# Patient Record
Sex: Female | Born: 1950 | Race: Black or African American | Hispanic: No | Marital: Single | Smoking: Never smoker
Health system: Southern US, Community
[De-identification: ages and names within clinical notes are randomized; demographics above are authoritative.]

## PROBLEM LIST (undated history)

## (undated) DIAGNOSIS — Z8041 Family history of malignant neoplasm of ovary: Secondary | ICD-10-CM

## (undated) DIAGNOSIS — M1990 Unspecified osteoarthritis, unspecified site: Secondary | ICD-10-CM

## (undated) DIAGNOSIS — N6091 Unspecified benign mammary dysplasia of right breast: Secondary | ICD-10-CM

## (undated) DIAGNOSIS — Z8042 Family history of malignant neoplasm of prostate: Secondary | ICD-10-CM

## (undated) DIAGNOSIS — I10 Essential (primary) hypertension: Secondary | ICD-10-CM

## (undated) DIAGNOSIS — Z800 Family history of malignant neoplasm of digestive organs: Secondary | ICD-10-CM

## (undated) DIAGNOSIS — Z923 Personal history of irradiation: Secondary | ICD-10-CM

## (undated) DIAGNOSIS — K219 Gastro-esophageal reflux disease without esophagitis: Secondary | ICD-10-CM

## (undated) DIAGNOSIS — E785 Hyperlipidemia, unspecified: Secondary | ICD-10-CM

## (undated) HISTORY — DX: Family history of malignant neoplasm of prostate: Z80.42

## (undated) HISTORY — PX: BREAST LUMPECTOMY: SHX2

## (undated) HISTORY — DX: Family history of malignant neoplasm of ovary: Z80.41

## (undated) HISTORY — PX: ABDOMINAL HYSTERECTOMY: SHX81

## (undated) HISTORY — DX: Family history of malignant neoplasm of digestive organs: Z80.0

## (undated) HISTORY — PX: JOINT REPLACEMENT: SHX530

---

## 2006-08-16 ENCOUNTER — Emergency Department (HOSPITAL_COMMUNITY): Admission: EM | Admit: 2006-08-16 | Discharge: 2006-08-17 | Payer: Self-pay | Admitting: Emergency Medicine

## 2008-06-22 IMAGING — CT CT HEAD W/O CM
3 of 4 series · 16 of 47 positions shown, 19 images · non-contrast
Comparison: None.
COMPARISON: None.

CLINICAL DATA: Fell, striking the right side of the head and the right side of
the face. Headache.

CRANIAL CT WITHOUT CONTRAST  08/16/2006:
TECHNIQUE: 5mm axial images were obtained from the skull base through the
vertex without intravenous contrast.
TECHNIQUE: Axial plane CT imaging of the maxillofacial structures was performed
including the facial bones, paranasal sinuses, and orbits.  No intravenous
contrast was administered. Computer-generated coronal and sagittal reformatted
images obtained. A metallic marker was placed on the right temple in order to
reliably differentiate right from left.

[Series 6: facial 2.0 h30s st · axial · 0.33mm/px · z∈[-210,-82]mm · 10 of 72 slices shown, 13 images]
[im 4/72  brain]
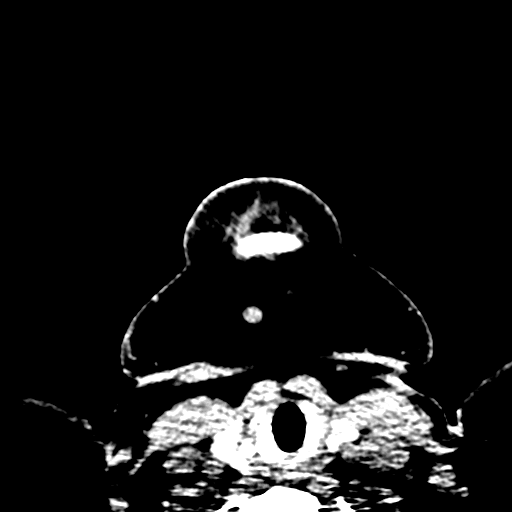
[im 4/72  bone]
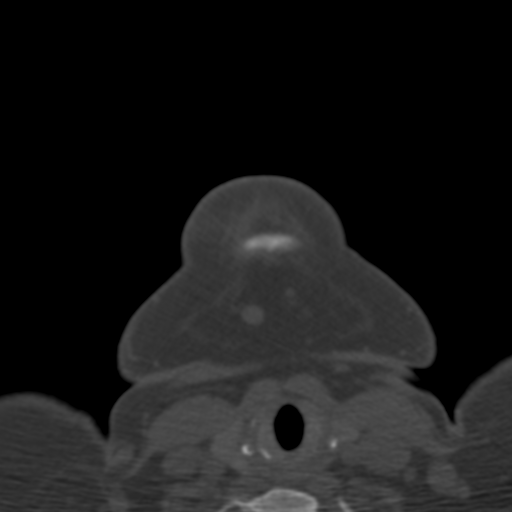
[im 11/72  brain]
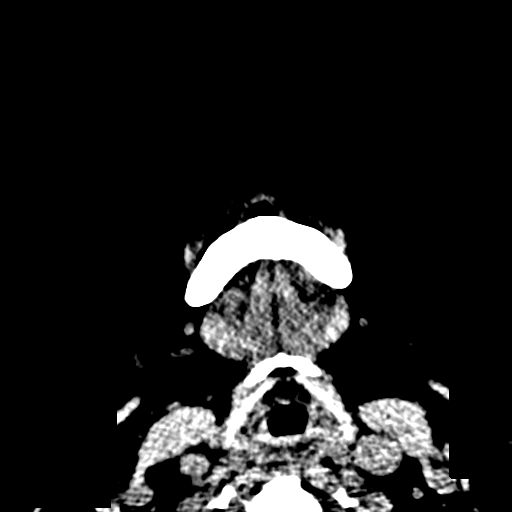
[im 18/72  brain]
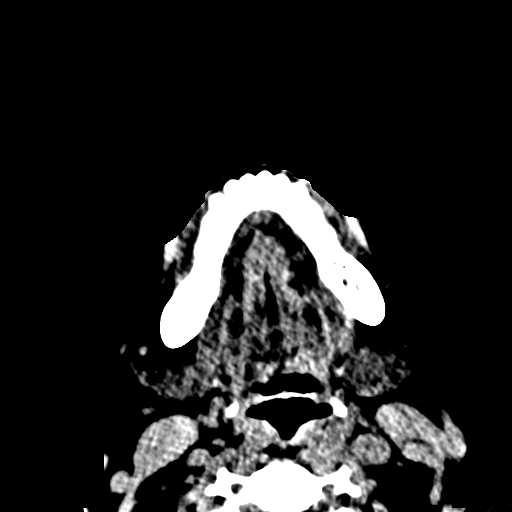
[im 25/72  brain]
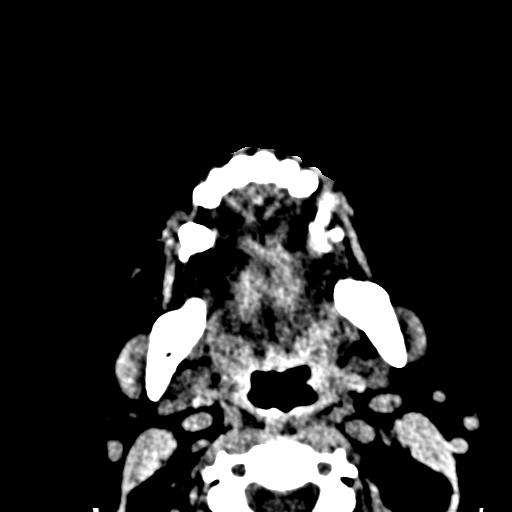
[im 32/72  brain]
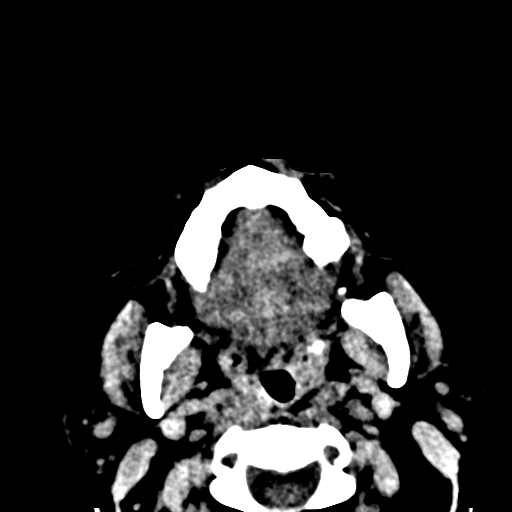
[im 32/72  bone]
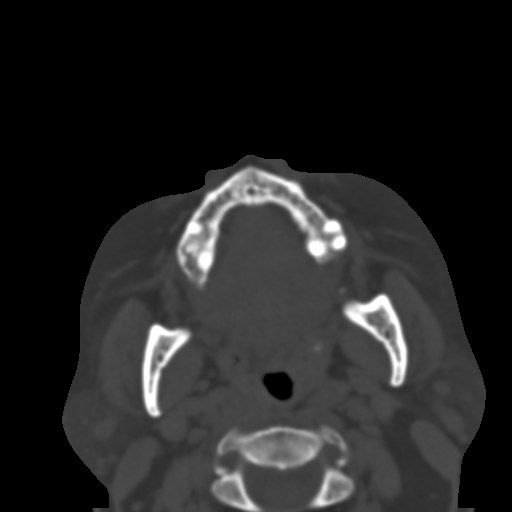
[im 40/72  brain]
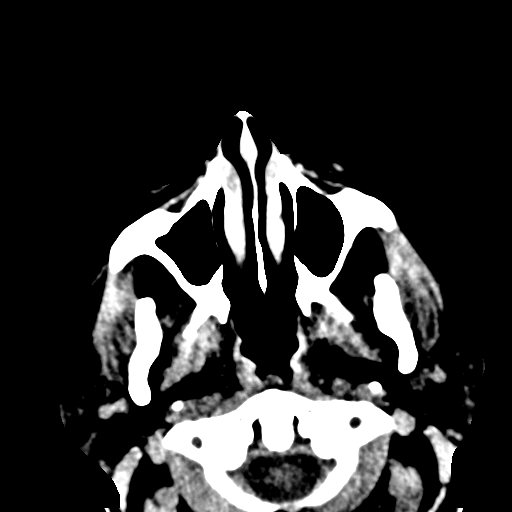
[im 47/72  brain]
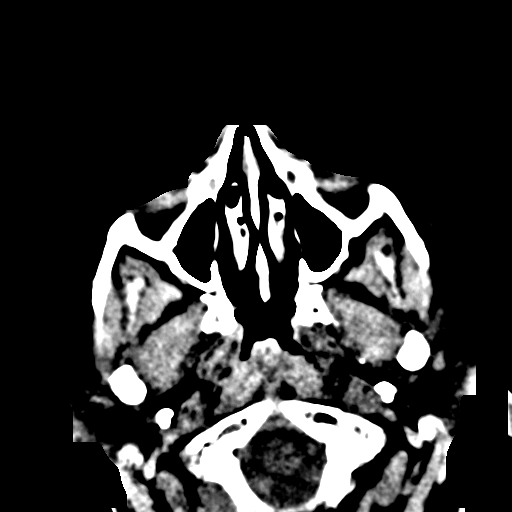
[im 54/72  brain]
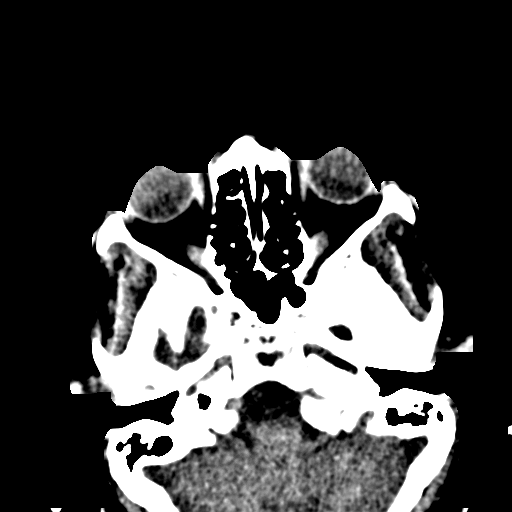
[im 61/72  brain]
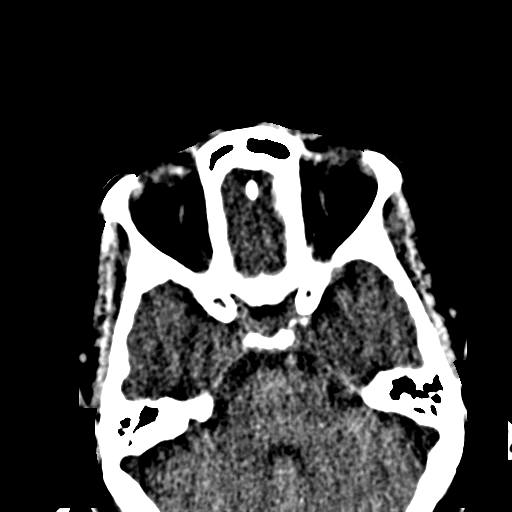
[im 61/72  bone]
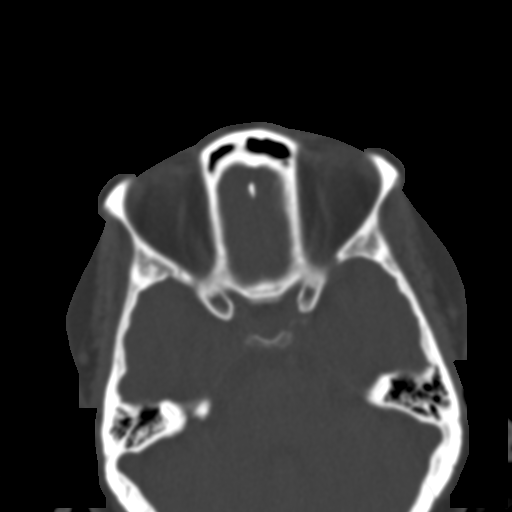
[im 68/72  brain]
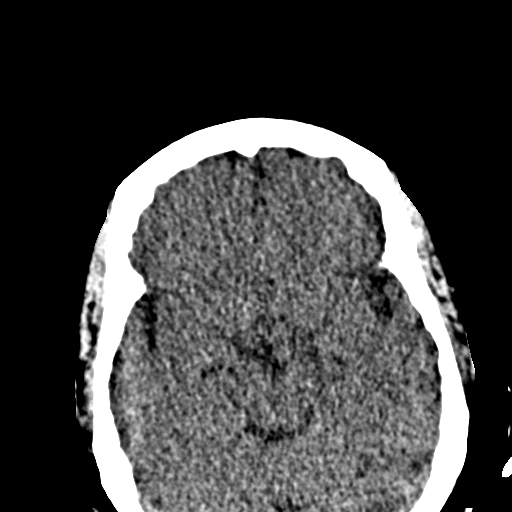

[Series 7: facial sagittal · sagittal · 0.39mm/px · 3 of 75 slices shown]
[im 25/75  brain]
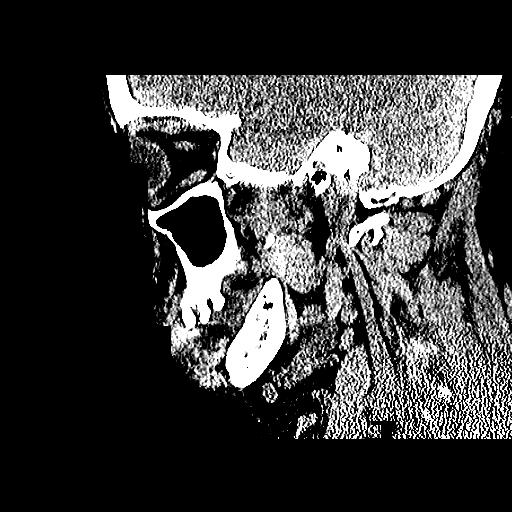
[im 38/75  brain]
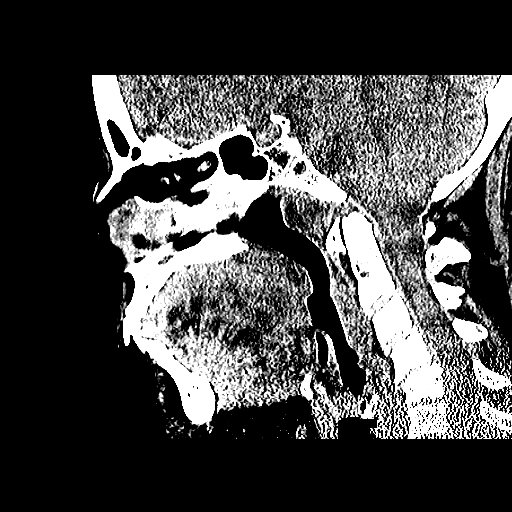
[im 50/75  brain]
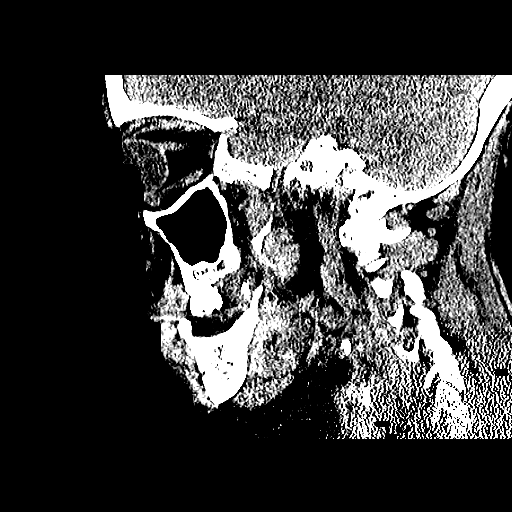

[Series 8: facial coronal · coronal · 0.39mm/px · 3 of 69 slices shown]
[im 23/69  brain]
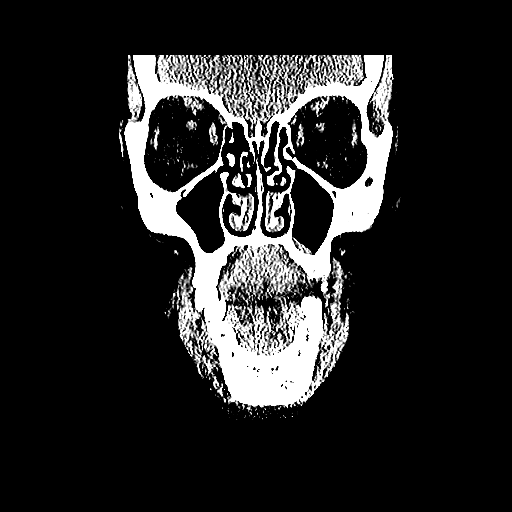
[im 31/69  brain]
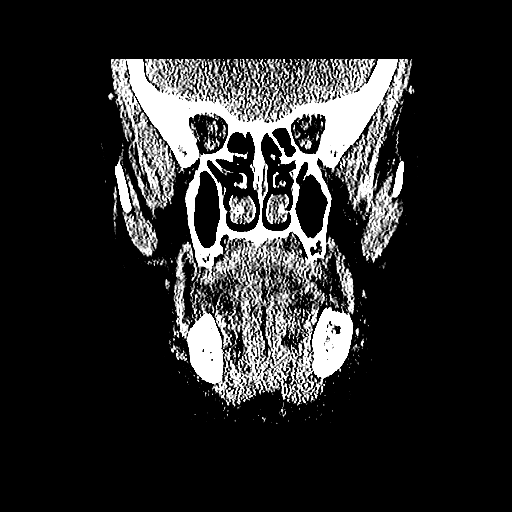
[im 38/69  brain]
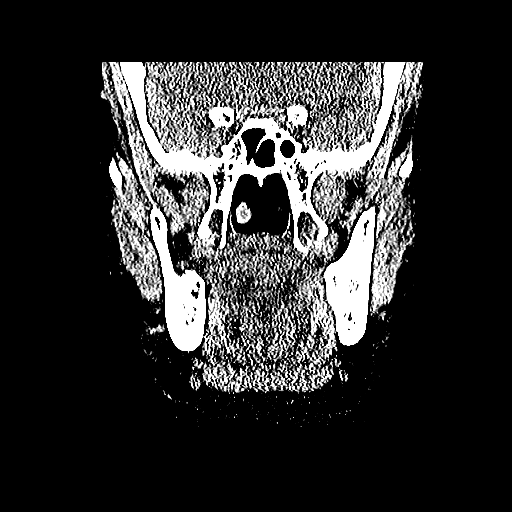

[16 of 47 positions shown; findings below may reference images not displayed]

FINDINGS: Ventricular system normal in size and appearance for age. No mass
lesion or midline shift. No acute hemorrhage or hematoma. No extra-axial fluid
collections. Linear calcifications in the basal ganglia. No acute
stroke/infarction or other focal brain parenchymal abnormalities.

No skull fractures or other focal osseous abnormalities involving the skull.
Mastoid air cells well-aerated.
IMPRESSION: No significant intracranial abnormalities. 

CT MAXILLOFACIAL WITHOUT CONTRAST 08/16/2006:
FINDINGS: No fractures involving the facial bones. Paranasal sinuses well
aerated with the exception of minimal mucosal thickening at the base of the left
maxillary sinus. Both orbits and both globes intact. Bony nasal septum midline.
No anatomic variants involving the paranasal sinuses. Temporomandibular joints
intact. Caries noted involving multiple teeth.
IMPRESSION: 1. No facial bone fractures.
2. Minimal, insignificant chronic left maxillary sinusitis. 
3. Dental disease.

## 2008-06-22 IMAGING — CR DG LUMBAR SPINE COMPLETE 4+V
5 series · 5 of 5 positions shown · non-contrast
Comparison: None.
COMPARISON: None.

CLINICAL DATA: Fell. Low back pain and right wrist pain.

LUMBAR SPINE - 5 VIEW 08/16/2006:

[t l-spine a.p.]
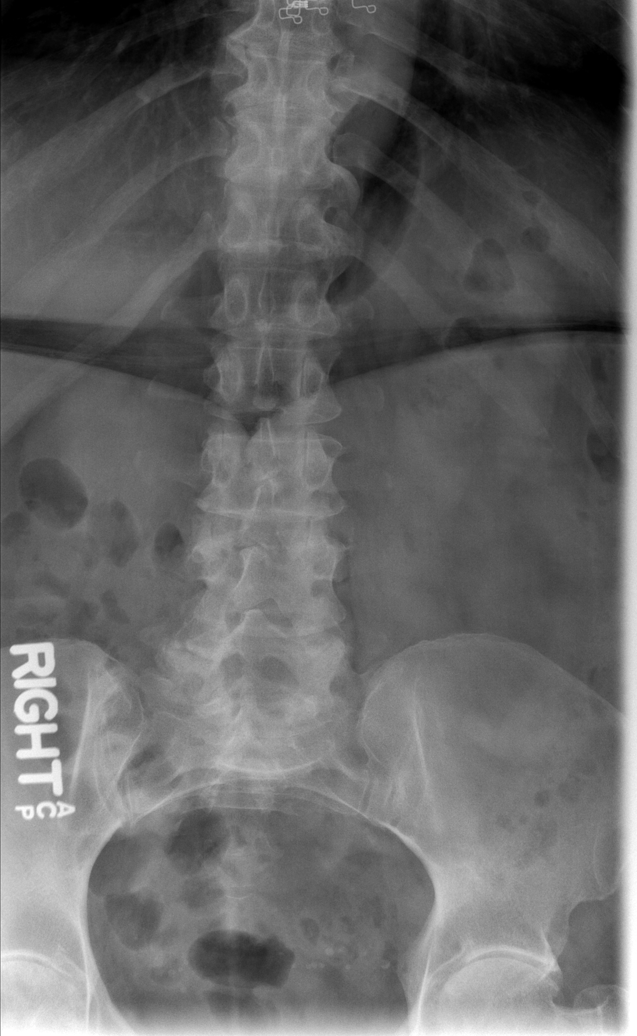

[t l-spine oblique exposure (1 of 2)]
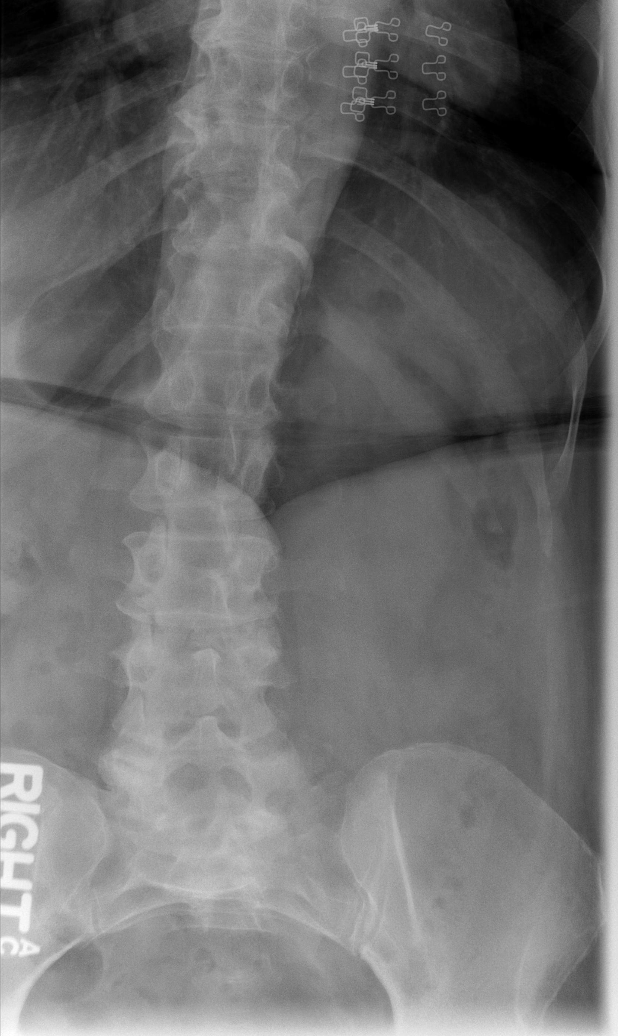

[t l-spine oblique exposure (2 of 2)]
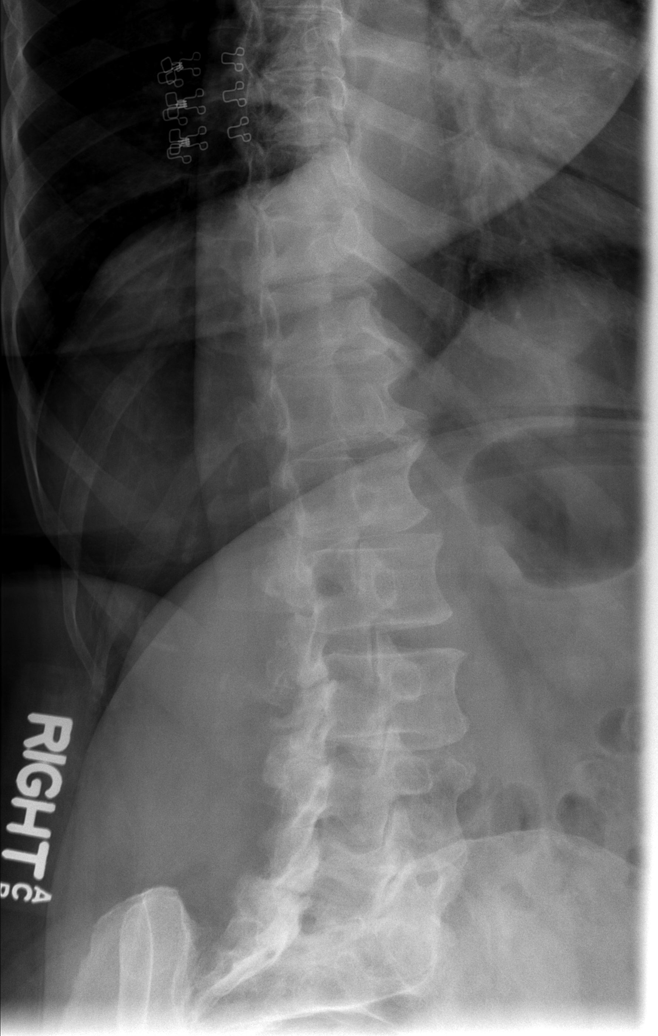

[t l-spine lat *]
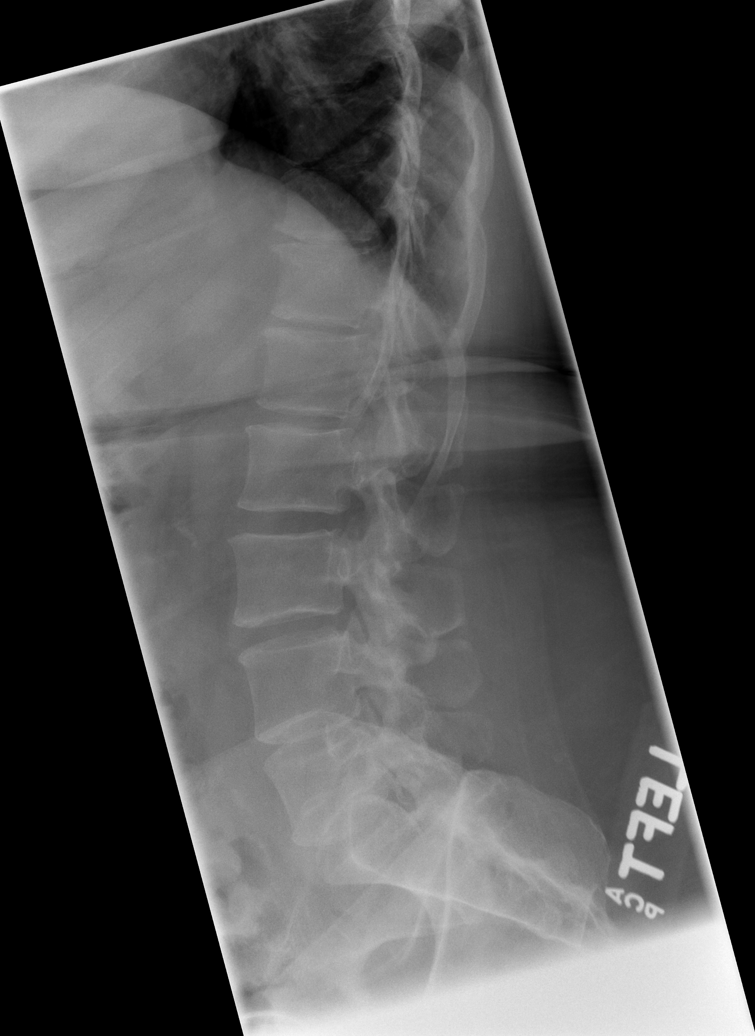

[t l-spine l5-s1 spot *]
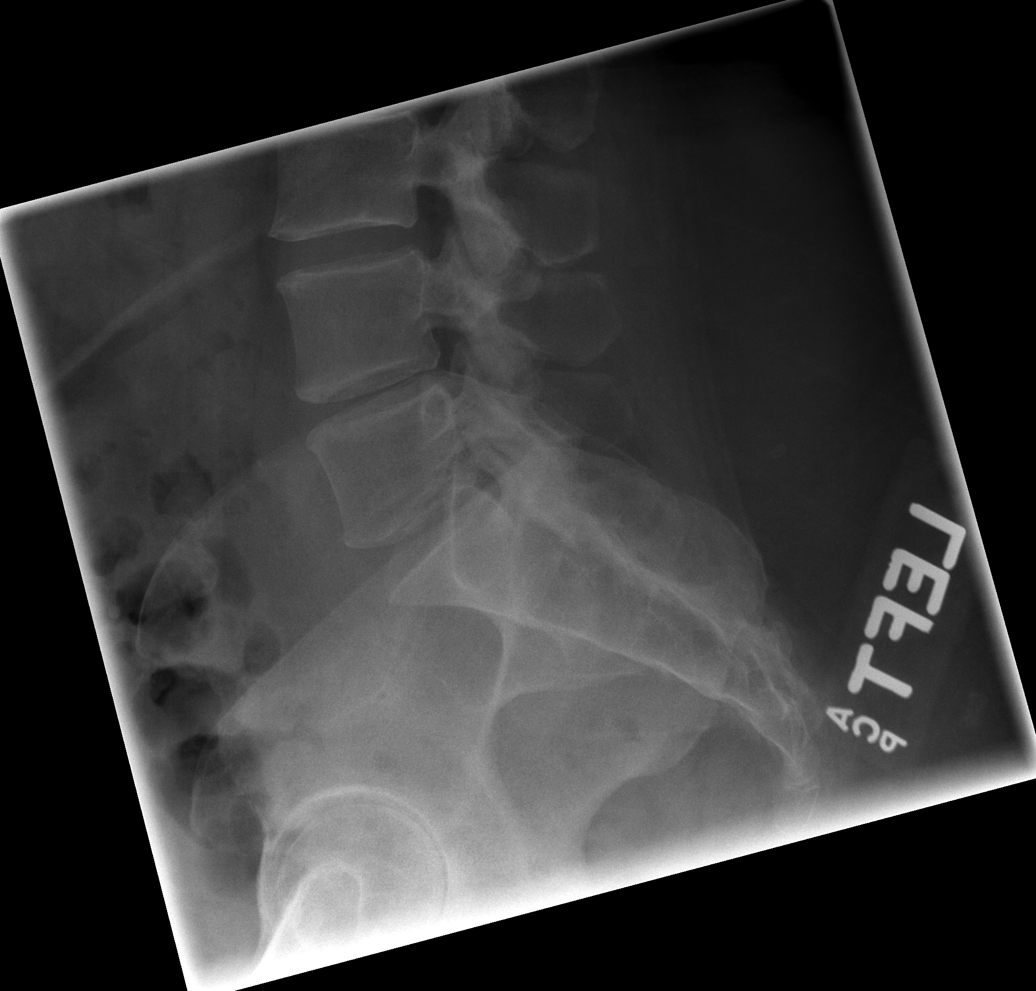

[5 of 5 positions shown; findings below may reference images not displayed]

FINDINGS: Five nonrib-bearing lumbar vertebra with anatomic alignment. Mild
disc space narrowing T12-L1 and L1-L2. Remaining disc spaces well-preserved. No
pars defect. Facet degenerative changes on the right at L5-S1. Visualized
sacroiliac joints intact. Lower thoracic spondylosis noted.
IMPRESSION: No acute skeletal abnormalities. Mild degenerative disc disease T12-L1 and
L1-L2. Right-sided facet degenerative changes at L5-S1. 

RIGHT  WRIST - 4 VIEW  08/16/2006:
FINDINGS: No evidence of acute fracture or dislocation. Joint spaces
well-preserved. No intrinsic osseous abnormalities.
IMPRESSION: No acute skeletal abnormalities.

## 2015-04-14 ENCOUNTER — Other Ambulatory Visit (HOSPITAL_COMMUNITY): Payer: Self-pay | Admitting: Orthopedic Surgery

## 2015-04-14 DIAGNOSIS — M25562 Pain in left knee: Secondary | ICD-10-CM

## 2015-04-14 DIAGNOSIS — M25561 Pain in right knee: Secondary | ICD-10-CM

## 2015-04-19 ENCOUNTER — Encounter (HOSPITAL_COMMUNITY): Payer: Medicare Other

## 2020-12-26 ENCOUNTER — Other Ambulatory Visit: Payer: Self-pay | Admitting: General Surgery

## 2020-12-26 DIAGNOSIS — N6091 Unspecified benign mammary dysplasia of right breast: Secondary | ICD-10-CM

## 2020-12-27 ENCOUNTER — Other Ambulatory Visit: Payer: Self-pay | Admitting: General Surgery

## 2020-12-27 DIAGNOSIS — N6091 Unspecified benign mammary dysplasia of right breast: Secondary | ICD-10-CM

## 2021-01-13 ENCOUNTER — Encounter (HOSPITAL_BASED_OUTPATIENT_CLINIC_OR_DEPARTMENT_OTHER): Payer: Self-pay | Admitting: General Surgery

## 2021-01-17 ENCOUNTER — Other Ambulatory Visit: Payer: Self-pay

## 2021-01-17 ENCOUNTER — Encounter (HOSPITAL_BASED_OUTPATIENT_CLINIC_OR_DEPARTMENT_OTHER): Payer: Self-pay | Admitting: General Surgery

## 2021-01-20 ENCOUNTER — Encounter (HOSPITAL_BASED_OUTPATIENT_CLINIC_OR_DEPARTMENT_OTHER)
Admission: RE | Admit: 2021-01-20 | Discharge: 2021-01-20 | Disposition: A | Discharge status: Home / Self Care | Payer: Medicare PPO | Source: Ambulatory Visit | Attending: General Surgery | Admitting: General Surgery

## 2021-01-20 DIAGNOSIS — I10 Essential (primary) hypertension: Secondary | ICD-10-CM | POA: Diagnosis not present

## 2021-01-20 DIAGNOSIS — Z01818 Encounter for other preprocedural examination: Secondary | ICD-10-CM | POA: Diagnosis not present

## 2021-01-20 LAB — BASIC METABOLIC PANEL
Anion gap: 10 (ref 5–15)
BUN: 20 mg/dL (ref 8–23)
CO2: 24 mmol/L (ref 22–32)
Calcium: 9.7 mg/dL (ref 8.9–10.3)
Chloride: 105 mmol/L (ref 98–111)
Creatinine, Ser: 1.39 mg/dL — ABNORMAL HIGH (ref 0.44–1.00)
GFR, Estimated: 41 mL/min — ABNORMAL LOW (ref >60)
Glucose, Bld: 88 mg/dL (ref 70–99)
Potassium: 4.3 mmol/L (ref 3.5–5.1)
Sodium: 139 mmol/L (ref 135–145)

## 2021-01-20 NOTE — Progress Notes (Signed)

## 2021-01-24 ENCOUNTER — Other Ambulatory Visit: Payer: Self-pay | Admitting: General Surgery

## 2021-01-24 ENCOUNTER — Ambulatory Visit
Admission: RE | Admit: 2021-01-24 | Discharge: 2021-01-24 | Disposition: A | Discharge status: Home / Self Care | Payer: Medicare PPO | Source: Ambulatory Visit | Attending: General Surgery | Admitting: General Surgery

## 2021-01-24 ENCOUNTER — Other Ambulatory Visit: Payer: Self-pay

## 2021-01-24 DIAGNOSIS — N6091 Unspecified benign mammary dysplasia of right breast: Secondary | ICD-10-CM

## 2021-01-24 NOTE — Anesthesia Preprocedure Evaluation (Addendum)
Anesthesia Evaluation  Patient identified by MRN, date of birth, ID band Patient awake    Reviewed: Allergy & Precautions, NPO status , Patient's Chart, lab work & pertinent test results, reviewed documented beta blocker date and time   History of Anesthesia Complications Negative for: history of anesthetic complications  Airway Mallampati: II  TM Distance: >3 FB Neck ROM: Full    Dental  (+) Chipped, Missing, Poor Dentition,    Pulmonary neg pulmonary ROS,    Pulmonary exam normal        Cardiovascular hypertension, Pt. on medications and Pt. on home beta blockers Normal cardiovascular exam     Neuro/Psych negative neurological ROS  negative psych ROS   GI/Hepatic GERD  Controlled,  Endo/Other  Morbid obesity  Renal/GU Cr 1.39  negative genitourinary   Musculoskeletal  (+) Arthritis ,   Abdominal   Peds  Hematology negative hematology ROS (+)   Anesthesia Other Findings Day of surgery medications reviewed with patient.  Reproductive/Obstetrics negative OB ROS                           Anesthesia Physical Anesthesia Plan  ASA: 3  Anesthesia Plan: General   Post-op Pain Management: Tylenol PO (pre-op)   Induction: Intravenous  PONV Risk Score and Plan: 3 and Treatment may vary due to age or medical condition, Ondansetron and Dexamethasone  Airway Management Planned: LMA  Additional Equipment: None  Intra-op Plan:   Post-operative Plan: Extubation in OR  Informed Consent: I have reviewed the patients History and Physical, chart, labs and discussed the procedure including the risks, benefits and alternatives for the proposed anesthesia with the patient or authorized representative who has indicated his/her understanding and acceptance.     Dental advisory given  Plan Discussed with: CRNA  Anesthesia Plan Comments:        Anesthesia Quick Evaluation

## 2021-01-25 ENCOUNTER — Encounter (HOSPITAL_BASED_OUTPATIENT_CLINIC_OR_DEPARTMENT_OTHER)
Admission: RE | Disposition: A | Discharge status: Home / Self Care | Payer: Self-pay | Source: Home / Self Care | Attending: General Surgery

## 2021-01-25 ENCOUNTER — Ambulatory Visit
Admission: RE | Admit: 2021-01-25 | Discharge: 2021-01-25 | Disposition: A | Discharge status: Home / Self Care | Payer: Medicare Other | Source: Ambulatory Visit | Attending: General Surgery | Admitting: General Surgery

## 2021-01-25 ENCOUNTER — Ambulatory Visit (HOSPITAL_BASED_OUTPATIENT_CLINIC_OR_DEPARTMENT_OTHER)
Admission: RE | Admit: 2021-01-25 | Discharge: 2021-01-25 | Disposition: A | Discharge status: Home / Self Care | Payer: Medicare PPO | Attending: General Surgery | Admitting: General Surgery

## 2021-01-25 ENCOUNTER — Encounter (HOSPITAL_BASED_OUTPATIENT_CLINIC_OR_DEPARTMENT_OTHER): Payer: Self-pay | Admitting: General Surgery

## 2021-01-25 ENCOUNTER — Ambulatory Visit (HOSPITAL_BASED_OUTPATIENT_CLINIC_OR_DEPARTMENT_OTHER): Payer: Medicare PPO | Admitting: Anesthesiology

## 2021-01-25 ENCOUNTER — Other Ambulatory Visit: Payer: Self-pay

## 2021-01-25 DIAGNOSIS — Z6839 Body mass index (BMI) 39.0-39.9, adult: Secondary | ICD-10-CM | POA: Insufficient documentation

## 2021-01-25 DIAGNOSIS — I10 Essential (primary) hypertension: Secondary | ICD-10-CM | POA: Diagnosis not present

## 2021-01-25 DIAGNOSIS — D0581 Other specified type of carcinoma in situ of right breast: Secondary | ICD-10-CM | POA: Insufficient documentation

## 2021-01-25 DIAGNOSIS — R928 Other abnormal and inconclusive findings on diagnostic imaging of breast: Secondary | ICD-10-CM | POA: Diagnosis present

## 2021-01-25 DIAGNOSIS — Z79899 Other long term (current) drug therapy: Secondary | ICD-10-CM | POA: Diagnosis not present

## 2021-01-25 DIAGNOSIS — K219 Gastro-esophageal reflux disease without esophagitis: Secondary | ICD-10-CM | POA: Insufficient documentation

## 2021-01-25 DIAGNOSIS — C50919 Malignant neoplasm of unspecified site of unspecified female breast: Secondary | ICD-10-CM

## 2021-01-25 DIAGNOSIS — N6091 Unspecified benign mammary dysplasia of right breast: Secondary | ICD-10-CM

## 2021-01-25 HISTORY — PX: RADIOACTIVE SEED GUIDED EXCISIONAL BREAST BIOPSY: SHX6490

## 2021-01-25 HISTORY — DX: Essential (primary) hypertension: I10

## 2021-01-25 HISTORY — DX: Unspecified benign mammary dysplasia of right breast: N60.91

## 2021-01-25 HISTORY — DX: Unspecified osteoarthritis, unspecified site: M19.90

## 2021-01-25 HISTORY — DX: Malignant neoplasm of unspecified site of unspecified female breast: C50.919

## 2021-01-25 HISTORY — DX: Gastro-esophageal reflux disease without esophagitis: K21.9

## 2021-01-25 HISTORY — DX: Hyperlipidemia, unspecified: E78.5

## 2021-01-25 SURGERY — RADIOACTIVE SEED GUIDED BREAST BIOPSY
Anesthesia: General | Site: Breast | Laterality: Right

## 2021-01-25 MED ORDER — OXYCODONE HCL 5 MG PO TABS
ORAL_TABLET | ORAL | Status: AC
  Filled 2021-01-25: qty 1

## 2021-01-25 MED ORDER — PHENYLEPHRINE 40 MCG/ML (10ML) SYRINGE FOR IV PUSH (FOR BLOOD PRESSURE SUPPORT)
PREFILLED_SYRINGE | INTRAVENOUS | Status: AC
  Filled 2021-01-25: qty 10

## 2021-01-25 MED ORDER — EPHEDRINE 5 MG/ML INJ
INTRAVENOUS | Status: AC
  Filled 2021-01-25: qty 5

## 2021-01-25 MED ORDER — SUCCINYLCHOLINE CHLORIDE 200 MG/10ML IV SOSY
PREFILLED_SYRINGE | INTRAVENOUS | Status: AC
  Filled 2021-01-25: qty 10

## 2021-01-25 MED ORDER — ACETAMINOPHEN 500 MG PO TABS
1000.0000 mg | ORAL_TABLET | ORAL | Status: AC
  Administered 2021-01-25: 1000 mg via ORAL

## 2021-01-25 MED ORDER — MIDAZOLAM HCL 2 MG/2ML IJ SOLN
INTRAMUSCULAR | Status: AC
  Filled 2021-01-25: qty 2

## 2021-01-25 MED ORDER — PHENYLEPHRINE HCL-NACL 20-0.9 MG/250ML-% IV SOLN
INTRAVENOUS | Status: DC | PRN
  Administered 2021-01-25: 40 ug/min via INTRAVENOUS

## 2021-01-25 MED ORDER — FENTANYL CITRATE (PF) 100 MCG/2ML IJ SOLN: 25.0000 ug | INTRAMUSCULAR | Status: DC | PRN

## 2021-01-25 MED ORDER — CHLORHEXIDINE GLUCONATE CLOTH 2 % EX PADS: 6.0000 | MEDICATED_PAD | Freq: Once | CUTANEOUS | Status: DC

## 2021-01-25 MED ORDER — LIDOCAINE-EPINEPHRINE (PF) 1 %-1:200000 IJ SOLN
INTRAMUSCULAR | Status: DC | PRN
  Administered 2021-01-25: 40 mL via INTRAMUSCULAR

## 2021-01-25 MED ORDER — FENTANYL CITRATE (PF) 100 MCG/2ML IJ SOLN
INTRAMUSCULAR | Status: DC | PRN
  Administered 2021-01-25 (×2): 50 ug via INTRAVENOUS

## 2021-01-25 MED ORDER — ONDANSETRON HCL 4 MG/2ML IJ SOLN
INTRAMUSCULAR | Status: AC
  Filled 2021-01-25: qty 2

## 2021-01-25 MED ORDER — LACTATED RINGERS IV SOLN: INTRAVENOUS | Status: DC

## 2021-01-25 MED ORDER — LIDOCAINE 2% (20 MG/ML) 5 ML SYRINGE
INTRAMUSCULAR | Status: DC | PRN
  Administered 2021-01-25: 60 mg via INTRAVENOUS
  Administered 2021-01-25: 40 mg via INTRAVENOUS

## 2021-01-25 MED ORDER — OXYCODONE HCL 5 MG PO TABS
5.0000 mg | ORAL_TABLET | Freq: Once | ORAL | Status: AC | PRN
  Administered 2021-01-25: 5 mg via ORAL

## 2021-01-25 MED ORDER — 0.9 % SODIUM CHLORIDE (POUR BTL) OPTIME
TOPICAL | Status: DC | PRN
  Administered 2021-01-25: 200 mL

## 2021-01-25 MED ORDER — CEFAZOLIN SODIUM-DEXTROSE 2-4 GM/100ML-% IV SOLN
2.0000 g | INTRAVENOUS | Status: AC
  Administered 2021-01-25: 2 g via INTRAVENOUS

## 2021-01-25 MED ORDER — OXYCODONE HCL 5 MG/5ML PO SOLN: 5.0000 mg | Freq: Once | ORAL | Status: AC | PRN

## 2021-01-25 MED ORDER — FENTANYL CITRATE (PF) 100 MCG/2ML IJ SOLN
INTRAMUSCULAR | Status: AC
  Filled 2021-01-25: qty 2

## 2021-01-25 MED ORDER — ACETAMINOPHEN 500 MG PO TABS: 1000.0000 mg | ORAL_TABLET | Freq: Once | ORAL | Status: DC

## 2021-01-25 MED ORDER — DEXAMETHASONE SODIUM PHOSPHATE 10 MG/ML IJ SOLN
INTRAMUSCULAR | Status: AC
  Filled 2021-01-25: qty 1

## 2021-01-25 MED ORDER — PHENYLEPHRINE HCL (PRESSORS) 10 MG/ML IV SOLN
INTRAVENOUS | Status: AC
  Filled 2021-01-25: qty 1

## 2021-01-25 MED ORDER — OXYCODONE HCL 5 MG PO TABS: 5.0000 mg | ORAL_TABLET | Freq: Four times a day (QID) | ORAL | 0 refills | Status: DC | PRN

## 2021-01-25 MED ORDER — ONDANSETRON HCL 4 MG/2ML IJ SOLN
INTRAMUSCULAR | Status: DC | PRN
  Administered 2021-01-25: 4 mg via INTRAVENOUS

## 2021-01-25 MED ORDER — CEFAZOLIN SODIUM-DEXTROSE 2-4 GM/100ML-% IV SOLN
INTRAVENOUS | Status: AC
  Filled 2021-01-25: qty 100

## 2021-01-25 MED ORDER — EPHEDRINE SULFATE 50 MG/ML IJ SOLN
INTRAMUSCULAR | Status: DC | PRN
  Administered 2021-01-25: 15 mg via INTRAVENOUS
  Administered 2021-01-25: 10 mg via INTRAVENOUS

## 2021-01-25 MED ORDER — PROPOFOL 10 MG/ML IV BOLUS
INTRAVENOUS | Status: DC | PRN
  Administered 2021-01-25: 150 mg via INTRAVENOUS

## 2021-01-25 MED ORDER — LIDOCAINE 2% (20 MG/ML) 5 ML SYRINGE
INTRAMUSCULAR | Status: AC
  Filled 2021-01-25: qty 5

## 2021-01-25 MED ORDER — ACETAMINOPHEN 500 MG PO TABS
ORAL_TABLET | ORAL | Status: AC
  Filled 2021-01-25: qty 2

## 2021-01-25 MED ORDER — ATROPINE SULFATE 0.4 MG/ML IV SOLN
INTRAVENOUS | Status: AC
  Filled 2021-01-25: qty 1

## 2021-01-25 SURGICAL SUPPLY — 42 items
ADH SKN CLS APL DERMABOND .7 (GAUZE/BANDAGES/DRESSINGS) ×1
APL PRP STRL LF DISP 70% ISPRP (MISCELLANEOUS) ×1
BLADE SURG 10 STRL SS (BLADE) ×2 IMPLANT
BLADE SURG 15 STRL LF DISP TIS (BLADE) IMPLANT
BLADE SURG 15 STRL SS (BLADE) ×2
CANISTER SUCT 1200ML W/VALVE (MISCELLANEOUS) ×2 IMPLANT
CHLORAPREP W/TINT 26 (MISCELLANEOUS) ×2 IMPLANT
CLIP TI LARGE 6 (CLIP) ×2 IMPLANT
COVER BACK TABLE 60X90IN (DRAPES) ×2 IMPLANT
COVER MAYO STAND STRL (DRAPES) ×2 IMPLANT
COVER PROBE W GEL 5X96 (DRAPES) ×2 IMPLANT
DERMABOND ADVANCED (GAUZE/BANDAGES/DRESSINGS) ×1
DERMABOND ADVANCED .7 DNX12 (GAUZE/BANDAGES/DRESSINGS) ×1 IMPLANT
DRAPE LAPAROSCOPIC ABDOMINAL (DRAPES) ×2 IMPLANT
DRAPE UTILITY XL STRL (DRAPES) ×2 IMPLANT
ELECT COATED BLADE 2.86 ST (ELECTRODE) ×2 IMPLANT
ELECT REM PT RETURN 9FT ADLT (ELECTROSURGICAL) ×2
ELECTRODE REM PT RTRN 9FT ADLT (ELECTROSURGICAL) ×1 IMPLANT
GAUZE SPONGE 4X4 12PLY STRL LF (GAUZE/BANDAGES/DRESSINGS) ×2 IMPLANT
GLOVE SURG ENC MOIS LTX SZ6 (GLOVE) ×2 IMPLANT
GLOVE SURG POLYISO LF SZ6.5 (GLOVE) ×1 IMPLANT
GLOVE SURG UNDER POLY LF SZ6.5 (GLOVE) ×3 IMPLANT
GOWN STRL REUS W/ TWL LRG LVL3 (GOWN DISPOSABLE) ×1 IMPLANT
GOWN STRL REUS W/TWL 2XL LVL3 (GOWN DISPOSABLE) ×2 IMPLANT
GOWN STRL REUS W/TWL LRG LVL3 (GOWN DISPOSABLE) ×2
KIT MARKER MARGIN INK (KITS) ×2 IMPLANT
NDL HYPO 25X1 1.5 SAFETY (NEEDLE) ×1 IMPLANT
NEEDLE HYPO 25X1 1.5 SAFETY (NEEDLE) ×2 IMPLANT
NS IRRIG 1000ML POUR BTL (IV SOLUTION) ×2 IMPLANT
PACK BASIN DAY SURGERY FS (CUSTOM PROCEDURE TRAY) ×2 IMPLANT
PENCIL SMOKE EVACUATOR (MISCELLANEOUS) ×2 IMPLANT
SLEEVE SCD COMPRESS KNEE MED (STOCKING) ×2 IMPLANT
SPONGE T-LAP 18X18 ~~LOC~~+RFID (SPONGE) ×2 IMPLANT
STRIP CLOSURE SKIN 1/2X4 (GAUZE/BANDAGES/DRESSINGS) ×2 IMPLANT
SUT MON AB 4-0 PC3 18 (SUTURE) ×2 IMPLANT
SUT VICRYL 3-0 CR8 SH (SUTURE) ×1 IMPLANT
SYR BULB EAR ULCER 3OZ GRN STR (SYRINGE) ×2 IMPLANT
SYR CONTROL 10ML LL (SYRINGE) ×2 IMPLANT
TOWEL GREEN STERILE FF (TOWEL DISPOSABLE) ×2 IMPLANT
TRAY FAXITRON CT DISP (TRAY / TRAY PROCEDURE) ×2 IMPLANT
TUBE CONNECTING 20X1/4 (TUBING) ×2 IMPLANT
YANKAUER SUCT BULB TIP NO VENT (SUCTIONS) ×2 IMPLANT

## 2021-01-25 NOTE — Interval H&P Note (Signed)
History and Physical Interval Note:  01/25/2021 9:09 AM  Vickie Collier  has presented today for surgery, with the diagnosis of RIGHT BREAST ATYPICAL DUCTAL HYPERPLASIA.  The various methods of treatment have been discussed with the patient and family. After consideration of risks, benefits and other options for treatment, the patient has consented to  Procedure(s): RADIOACTIVE SEED GUIDED EXCISIONAL RIGHT BREAST BIOPSY (Right) as a surgical intervention.  Dr. Enriqueta Shutter placed two seeds as there was some clip migration from the calcifications.  The patient's history has been reviewed, patient examined, no change in status, stable for surgery.  I have reviewed the patient's chart and labs.  Questions were answered to the patient's satisfaction.     Stark Klein

## 2021-01-25 NOTE — Anesthesia Postprocedure Evaluation (Signed)
Anesthesia Post Note  Patient: Vickie Collier  Procedure(s) Performed: RADIOACTIVE SEED GUIDED EXCISIONAL RIGHT BREAST BIOPSY (Right: Breast)     Patient location during evaluation: PACU Anesthesia Type: General Level of consciousness: awake and alert and oriented Pain management: pain level controlled Vital Signs Assessment: post-procedure vital signs reviewed and stable Respiratory status: spontaneous breathing, nonlabored ventilation and respiratory function stable Cardiovascular status: blood pressure returned to baseline Postop Assessment: no apparent nausea or vomiting Anesthetic complications: no   No notable events documented.  Last Vitals:  Vitals:   01/25/21 1100 01/25/21 1115  BP: 124/69 127/69  Pulse: 77   Resp: 11   Temp:    SpO2: 98%     Last Pain:  Vitals:   01/25/21 1115  TempSrc:   PainSc: [place]

## 2021-01-25 NOTE — Anesthesia Procedure Notes (Signed)
Procedure Name: LMA Insertion Date/Time: 01/25/2021 9:47 AM Performed by: Willa Frater, CRNA Pre-anesthesia Checklist: Patient identified, Emergency Drugs available, Suction available and Patient being monitored Patient Re-evaluated:Patient Re-evaluated prior to induction Oxygen Delivery Method: Circle system utilized Preoxygenation: Pre-oxygenation with 100% oxygen Induction Type: IV induction Ventilation: Mask ventilation without difficulty LMA: LMA inserted LMA Size: 4.0 Number of attempts: 1 Airway Equipment and Method: Bite block Placement Confirmation: positive ETCO2 Tube secured with: Tape Dental Injury: Teeth and Oropharynx as per pre-operative assessment

## 2021-01-25 NOTE — H&P (View-Only) (Signed)
REFERRING PHYSICIAN: Wannetta Sender  PROVIDER: Georgianne Fick, MD  Care Team: Patient Care Team: Cleon Gustin, MD as PCP - General (Family Medicine)   MRN: [id number] DOB: October 02, 1950   Subjective   Chief Complaint: New Patient (New patient ADH Rt Breast )   History of Present Illness: Vickie Collier is a 71 y.o. female who is seen today as an office consultation at the request of Dr. Wannetta Sender for evaluation of New Patient (New patient ADH Rt Breast ) .  Pt has an abnormal right mammogram and a discordant biopsy. She presented with screening detected right breast asymmetry with calcifications on mammogram 09/2020. She subsequently had dx mammogram in which the asymmetry appeared similar, but the calcifications were new and unclear. These were biopsied and showed ADH.   Pt hasn't personally had cancer before. Her maternal aunt and maternal grandmother had ovarian cancer, ages 21 and 36 respectively.   Diagnostic mammogram: 10/26/20 [facility].  DIGITAL IMAGE VIEWS  2D and 3D Right LM views were taken.  2D Right spot compression LM magnification views were taken.  2D Right spot compression CC magnification views were taken.  Rich Number, RTR(M)   BREAST DENSITY:  Right: There are scattered areas of fibroglandular density.   PRIOR STUDY COMPARISON:  02/04/2017 Bilateral Screening 3D/Tomosynthesis, [facility].  02/12/2018 Bilateral Screening  3D/Tomosynthesis, [facility]. 10/07/2020 Bilateral Screening  3D/Tomosynthesis, [facility].   FINDINGS:  Analyzed By CAD.  The described right asymmetry appears  predominantly linear and likely  comprised of prominent ducts.  This appears stable or slightly more extensive since priors, accounting for   technical differences.  There is a group of calcifications, etiology unclear, extending over 12 mm  and part of the  asymmetry.  Biopsy is recommended.  Risk values:   Tyrer-Cuzick 10 year model risk: 3.0%.Tyrer-Cuzick Lifetime model risk:  4.7%.Myriad Prevalence model risk: 1.5%.Baker Janus 5 year model risk: 1.4%.NCI  Lifetime model risk: 4.1%.Myriad Flag model risk: Based upon one or more  risk conditions, genetic testing for BRCA is recommended.MRS Risk Manager:  Based on personal history and/or family history, consideration of  hereditary risk assessment may be warranted.  National breast cancer screening guidelines vary. Please follow up as  indicated or at the discretion  of your referring provider.   OVERALL ACR BI-RADS ASSESSMENT: Suspicious(4)   Pathology core needle biopsy: 11/15/20 Atypical ductal hyperplasia involving papillary lesion  [facility]  Receptors:n/a  Review of Systems: A complete review of systems was obtained from the patient. I have reviewed this information and discussed as appropriate with the patient. See HPI as well for other ROS.  Review of Systems  All other systems reviewed and are negative.   Medical History: Past Medical History:  Diagnosis Date   Arthritis   GERD (gastroesophageal reflux disease)   Hypertension   Patient Active Problem List  Diagnosis   Atypical ductal hyperplasia of right breast   Past Surgical History:  Procedure Laterality Date   HYSTERECTOMY    No Known Allergies  Current Outpatient Medications on File Prior to Visit  Medication Sig Dispense Refill   felodipine (PLENDIL) 5 MG ER tablet Take 1 tablet (5 mg total) by mouth once daily   metoprolol succinate (TOPROL-XL) 50 MG XL tablet Take 1 tablet (50 mg total) by mouth once daily   rosuvastatin (CRESTOR) 5 MG tablet Take 1 tablet (5 mg total) by mouth once daily   valsartan-hydrochlorothiazide (DIOVAN-HCT) 80-12.5 mg tablet   No  current facility-administered medications on file prior to visit.   Family History  Problem Relation Age of Onset   High blood pressure (Hypertension) Father    Social History   Tobacco Use   Smoking Status Never  Smokeless Tobacco Never    Social History   Socioeconomic History   Marital status: Unknown  Tobacco Use   Smoking status: Never   Smokeless tobacco: Never  Substance and Sexual Activity   Alcohol use: Never   Drug use: Never   Objective:   Vitals:  BP: 130/72  Pulse: 90  Temp: 36.7 C (98.1 F)  SpO2: (!) 86%  Weight: (!) 108.5 kg (239 lb 3.2 oz)  Height: 165.1 cm (_0 )   Body mass index is 39.8 kg/m.  Gen: No acute distress. Well nourished and well groomed.  Neurological: Alert and oriented to person, place, and time. Coordination normal.  Head: Normocephalic and atraumatic.  Eyes: Conjunctivae are normal. Pupils are equal, round, and reactive to light. No scleral icterus.  Neck: Normal range of motion. Neck supple. No tracheal deviation or thyromegaly present.  Cardiovascular: Normal rate, regular rhythm, normal heart sounds and intact distal pulses. Exam reveals no gallop and no friction rub. No murmur heard. +2 pitting edema BLE.  Breast: left breast with faint bruising upper outer right breast. No nipple retraction or nipple discharge. No skin dimpling. No LAD. No palpable masses. Left breast also benign.  Respiratory: Effort normal. No respiratory distress. No chest wall tenderness. Breath sounds normal. No wheezes, rales or rhonchi.  GI: Soft. Bowel sounds are normal. The abdomen is soft and nontender. There is no rebound and no guarding.  Musculoskeletal: Normal range of motion. Extremities are nontender.  Lymphadenopathy: No cervical, preauricular, postauricular or axillary adenopathy is present Skin: Skin is warm and dry. No rash noted. No diaphoresis. No erythema. No pallor. No clubbing, cyanosis, or edema.  Psychiatric: Normal mood and affect. Behavior is normal. Judgment and thought content normal.   Labs n/a  Assessment and Plan:   Atypical ductal hyperplasia of right breast Will plan seed localized excision.   The surgical  procedure was described to the patient. I discussed the incision type and location and that we would need radiology involved on with a wire or seed marker and/or sentinel node.   The risks and benefits of the procedure were described to the patient and she wishes to proceed.   We discussed the risks bleeding, infection, damage to other structures, need for further procedures/surgeries. We discussed the risk of seroma. The patient was advised if the area in the breast in cancer, we may need to go back to surgery for additional tissue to obtain negative margins or for a lymph node biopsy. The patient was advised that these are the most common complications, but that others can occur as well. They were advised against taking aspirin or other anti-inflammatory agents/blood thinners the week before surgery.     Milus Height, MD FACS Surgical Oncology, General Surgery, Trauma and [facility] [place]

## 2021-01-25 NOTE — Transfer of Care (Signed)
Immediate Anesthesia Transfer of Care Note  Patient: Daine Gip  Procedure(s) Performed: RADIOACTIVE SEED GUIDED EXCISIONAL RIGHT BREAST BIOPSY (Right: Breast)  Patient Location: PACU  Anesthesia Type:General  Level of Consciousness: awake, alert , oriented, drowsy and patient cooperative  Airway & Oxygen Therapy: Patient Spontanous Breathing and Patient connected to face mask oxygen  Post-op Assessment: Report given to RN and Post -op Vital signs reviewed and stable  Post vital signs: Reviewed and stable  Last Vitals:  Vitals Value Taken Time  BP    Temp    Pulse 76 01/25/21 1052  Resp    SpO2 98 % 01/25/21 1052  Vitals shown include unvalidated device data.  Last Pain:  Vitals:   01/25/21 0916  TempSrc: [place]  PainSc: 0-No pain         Complications: No notable events documented.

## 2021-01-25 NOTE — H&P (Signed)
REFERRING PHYSICIAN: Wannetta Sender  PROVIDER: Georgianne Fick, MD  Care Team: Patient Care Team: Cleon Gustin, MD as PCP - General (Family Medicine)   MRN: [id number] DOB: October 02, 1950   Subjective   Chief Complaint: New Patient (New patient ADH Rt Breast )   History of Present Illness: Vickie Collier is a 71 y.o. female who is seen today as an office consultation at the request of Dr. Wannetta Sender for evaluation of New Patient (New patient ADH Rt Breast ) .  Pt has an abnormal right mammogram and a discordant biopsy. She presented with screening detected right breast asymmetry with calcifications on mammogram 09/2020. She subsequently had dx mammogram in which the asymmetry appeared similar, but the calcifications were new and unclear. These were biopsied and showed ADH.   Pt hasn't personally had cancer before. Her maternal aunt and maternal grandmother had ovarian cancer, ages 21 and 36 respectively.   Diagnostic mammogram: 10/26/20 [facility].  DIGITAL IMAGE VIEWS  2D and 3D Right LM views were taken.  2D Right spot compression LM magnification views were taken.  2D Right spot compression CC magnification views were taken.  Rich Number, RTR(M)   BREAST DENSITY:  Right: There are scattered areas of fibroglandular density.   PRIOR STUDY COMPARISON:  02/04/2017 Bilateral Screening 3D/Tomosynthesis, [facility].  02/12/2018 Bilateral Screening  3D/Tomosynthesis, [facility]. 10/07/2020 Bilateral Screening  3D/Tomosynthesis, [facility].   FINDINGS:  Analyzed By CAD.  The described right asymmetry appears  predominantly linear and likely  comprised of prominent ducts.  This appears stable or slightly more extensive since priors, accounting for   technical differences.  There is a group of calcifications, etiology unclear, extending over 12 mm  and part of the  asymmetry.  Biopsy is recommended.  Risk values:   Tyrer-Cuzick 10 year model risk: 3.0%.Tyrer-Cuzick Lifetime model risk:  4.7%.Myriad Prevalence model risk: 1.5%.Baker Janus 5 year model risk: 1.4%.NCI  Lifetime model risk: 4.1%.Myriad Flag model risk: Based upon one or more  risk conditions, genetic testing for BRCA is recommended.MRS Risk Manager:  Based on personal history and/or family history, consideration of  hereditary risk assessment may be warranted.  National breast cancer screening guidelines vary. Please follow up as  indicated or at the discretion  of your referring provider.   OVERALL ACR BI-RADS ASSESSMENT: Suspicious(4)   Pathology core needle biopsy: 11/15/20 Atypical ductal hyperplasia involving papillary lesion  [facility]  Receptors:n/a  Review of Systems: A complete review of systems was obtained from the patient. I have reviewed this information and discussed as appropriate with the patient. See HPI as well for other ROS.  Review of Systems  All other systems reviewed and are negative.   Medical History: Past Medical History:  Diagnosis Date   Arthritis   GERD (gastroesophageal reflux disease)   Hypertension   Patient Active Problem List  Diagnosis   Atypical ductal hyperplasia of right breast   Past Surgical History:  Procedure Laterality Date   HYSTERECTOMY    No Known Allergies  Current Outpatient Medications on File Prior to Visit  Medication Sig Dispense Refill   felodipine (PLENDIL) 5 MG ER tablet Take 1 tablet (5 mg total) by mouth once daily   metoprolol succinate (TOPROL-XL) 50 MG XL tablet Take 1 tablet (50 mg total) by mouth once daily   rosuvastatin (CRESTOR) 5 MG tablet Take 1 tablet (5 mg total) by mouth once daily   valsartan-hydrochlorothiazide (DIOVAN-HCT) 80-12.5 mg tablet   No  current facility-administered medications on file prior to visit.   Family History  Problem Relation Age of Onset   High blood pressure (Hypertension) Father    Social History   Tobacco Use   Smoking Status Never  Smokeless Tobacco Never    Social History   Socioeconomic History   Marital status: Unknown  Tobacco Use   Smoking status: Never   Smokeless tobacco: Never  Substance and Sexual Activity   Alcohol use: Never   Drug use: Never   Objective:   Vitals:  BP: 130/72  Pulse: 90  Temp: 36.7 C (98.1 F)  SpO2: (!) 86%  Weight: (!) 108.5 kg (239 lb 3.2 oz)  Height: 165.1 cm (_0 )   Body mass index is 39.8 kg/m.  Gen: No acute distress. Well nourished and well groomed.  Neurological: Alert and oriented to person, place, and time. Coordination normal.  Head: Normocephalic and atraumatic.  Eyes: Conjunctivae are normal. Pupils are equal, round, and reactive to light. No scleral icterus.  Neck: Normal range of motion. Neck supple. No tracheal deviation or thyromegaly present.  Cardiovascular: Normal rate, regular rhythm, normal heart sounds and intact distal pulses. Exam reveals no gallop and no friction rub. No murmur heard. +2 pitting edema BLE.  Breast: left breast with faint bruising upper outer right breast. No nipple retraction or nipple discharge. No skin dimpling. No LAD. No palpable masses. Left breast also benign.  Respiratory: Effort normal. No respiratory distress. No chest wall tenderness. Breath sounds normal. No wheezes, rales or rhonchi.  GI: Soft. Bowel sounds are normal. The abdomen is soft and nontender. There is no rebound and no guarding.  Musculoskeletal: Normal range of motion. Extremities are nontender.  Lymphadenopathy: No cervical, preauricular, postauricular or axillary adenopathy is present Skin: Skin is warm and dry. No rash noted. No diaphoresis. No erythema. No pallor. No clubbing, cyanosis, or edema.  Psychiatric: Normal mood and affect. Behavior is normal. Judgment and thought content normal.   Labs n/a  Assessment and Plan:   Atypical ductal hyperplasia of right breast Will plan seed localized excision.   The surgical  procedure was described to the patient. I discussed the incision type and location and that we would need radiology involved on with a wire or seed marker and/or sentinel node.   The risks and benefits of the procedure were described to the patient and she wishes to proceed.   We discussed the risks bleeding, infection, damage to other structures, need for further procedures/surgeries. We discussed the risk of seroma. The patient was advised if the area in the breast in cancer, we may need to go back to surgery for additional tissue to obtain negative margins or for a lymph node biopsy. The patient was advised that these are the most common complications, but that others can occur as well. They were advised against taking aspirin or other anti-inflammatory agents/blood thinners the week before surgery.     Milus Height, MD FACS Surgical Oncology, General Surgery, Trauma and [facility] [place]

## 2021-01-25 NOTE — Op Note (Signed)
Right Breast Radioactive seed bracketed excisional biopsy  Indications: This patient presents with history of abnormal right mammogram with ADH in a papillary lesion  Pre-operative Diagnosis: abnormal right mammogram    Post-operative Diagnosis: abnormal right mammogram  Surgeon: Stark Klein   Anesthesia: General endotracheal anesthesia  ASA Class: 3  Procedure Details  The patient was seen in the Holding Room. The risks, benefits, complications, treatment options, and expected outcomes were discussed with the patient. The possibilities of bleeding, infection, the need for additional procedures, failure to diagnose a condition, and creating a complication requiring transfusion or operation were discussed with the patient. The patient concurred with the proposed plan, giving informed consent.  The site of surgery properly noted/marked. The patient was taken to Operating Room # 8, identified, and the procedure verified as Right Breast seed localized excisional biopsy. A Time Out was held and the above information confirmed.  The right breast and chest were prepped and draped in standard fashion. A lateral circumlinear incision was made near the previously placed radioactive seeds.  Dissection was carried down around the point of maximum signal intensity. The cautery was used to perform the dissection.   The specimen was inked with the margin marker paint kit.    Specimen radiography confirmed inclusion of the mammographic lesion, the clip, and the seeds.  The background signal in the breast was zero.  Hemostasis was achieved with cautery.  The wound was irrigated and closed with 3-0 vicryl interrupted deep dermal sutures and 4-0 monocryl running subcuticular suture.      Sterile dressings were applied. At the end of the operation, all sponge, instrument, and needle counts were correct.  Findings: Seed, clip in specimen. Posterior margin is pectoralis.  Estimated Blood Loss:  min          Specimens: right breast tissue with seeds         Complications:  None; patient tolerated the procedure well.         Disposition: PACU - hemodynamically stable.         Condition: stable

## 2021-01-25 NOTE — Discharge Instructions (Addendum)
[place] Office Phone Number [phone]  BREAST BIOPSY/ PARTIAL MASTECTOMY: POST OP INSTRUCTIONS  Always review your discharge instruction sheet given to you by the facility where your surgery was performed.  IF YOU HAVE DISABILITY OR FAMILY LEAVE FORMS, YOU MUST BRING THEM TO THE OFFICE FOR PROCESSING.  DO NOT GIVE THEM TO YOUR DOCTOR.  A prescription for pain medication may be given to you upon discharge.  Take your pain medication as prescribed, if needed.  If narcotic pain medicine is not needed, then you may take acetaminophen (Tylenol) or ibuprofen (Advil) as needed. Take your usually prescribed medications unless otherwise directed If you need a refill on your pain medication, please contact your pharmacy.  They will contact our office to request authorization.  Prescriptions will not be filled after 5pm or on week-ends. You should eat very light the first 24 hours after surgery, such as soup, crackers, pudding, etc.  Resume your normal diet the day after surgery. Most patients will experience some swelling and bruising in the breast.  Ice packs and a good support bra will help.  Swelling and bruising can take several days to resolve.  It is common to experience some constipation if taking pain medication after surgery.  Increasing fluid intake and taking a stool softener will usually help or prevent this problem from occurring.  A mild laxative (Milk of Magnesia or Miralax) should be taken according to package directions if there are no bowel movements after 48 hours. Unless discharge instructions indicate otherwise, you may remove your bandages 48 hours after surgery, and you may shower at that time.  You may have steri-strips (small skin tapes) in place directly over the incision.  These strips should be left on the skin for 7-10 days.   Any sutures or staples will be removed at the office during your follow-up visit. ACTIVITIES:  You may resume regular daily activities  (gradually increasing) beginning the next day.  Wearing a good support bra or sports bra (or the breast binder) minimizes pain and swelling.  You may have sexual intercourse when it is comfortable. You may drive when you no longer are taking prescription pain medication, you can comfortably wear a seatbelt, and you can safely maneuver your car and apply brakes. RETURN TO WORK:  __________1 week_______________ Dennis Bast should see your doctor in the office for a follow-up appointment approximately two weeks after your surgery.  Your doctors nurse will typically make your follow-up appointment when she calls you with your pathology report.  Expect your pathology report 2-3 business days after your surgery.  You may call to check if you do not hear from [place] after three days.   WHEN TO CALL YOUR DOCTOR: Fever over 101.0 Nausea and/or vomiting. Extreme swelling or bruising. Continued bleeding from incision. Increased pain, redness, or drainage from the incision.  The clinic staff is available to answer your questions during regular business hours.  Please dont hesitate to call and ask to speak to one of the nurses for clinical concerns.  If you have a medical emergency, go to the nearest emergency room or call 911.  A surgeon from [facility] is always on call at the hospital.  For further questions, please visit [email or web address]     Post Anesthesia Home Care Instructions  Activity: Get plenty of rest for the remainder of the day. A responsible individual must stay with you for 24 hours following the procedure.  For the next 24 hours, DO NOT: -Drive a car -  Operate machinery -Drink alcoholic beverages -Take any medication unless instructed by your physician -Make any legal decisions or sign important papers.  Meals: Start with liquid foods such as gelatin or soup. Progress to regular foods as tolerated. Avoid greasy, spicy, heavy foods. If nausea and/or vomiting occur,  drink only clear liquids until the nausea and/or vomiting subsides. Call your physician if vomiting continues.  Special Instructions/Symptoms: Your throat may feel dry or sore from the anesthesia or the breathing tube placed in your throat during surgery. If this causes discomfort, gargle with warm salt water. The discomfort should disappear within 24 hours.  If you had a scopolamine patch placed behind your ear for the management of post- operative nausea and/or vomiting:  1. The medication in the patch is effective for 72 hours, after which it should be removed.  Wrap patch in a tissue and discard in the trash. Wash hands thoroughly with soap and water. 2. You may remove the patch earlier than 72 hours if you experience unpleasant side effects which may include dry mouth, dizziness or visual disturbances. 3. Avoid touching the patch. Wash your hands with soap and water after contact with the patch.

## 2021-01-26 ENCOUNTER — Encounter (HOSPITAL_BASED_OUTPATIENT_CLINIC_OR_DEPARTMENT_OTHER): Payer: Self-pay | Admitting: General Surgery

## 2021-01-31 ENCOUNTER — Telehealth: Payer: Self-pay | Admitting: General Surgery

## 2021-01-31 NOTE — Telephone Encounter (Signed)
Discussed pathology with patient.  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  She will need reexcision and we will discuss SLN after receptors are back.  Will send her to oncology as well pre op.

## 2021-02-02 ENCOUNTER — Telehealth: Payer: Self-pay | Admitting: Hematology and Oncology

## 2021-02-02 NOTE — Telephone Encounter (Signed)
Scheduled appt per 1/19 referral. Spoke to pt who is aware of appt date and time. I did offer pt appt on 1/27, however she was unable to take that appt due to already having another appt scheduled with a  different office. Pt is aware to arrive 15 mins prior to her appt time.

## 2021-02-09 ENCOUNTER — Encounter (HOSPITAL_COMMUNITY): Payer: Self-pay

## 2021-02-10 ENCOUNTER — Encounter (HOSPITAL_BASED_OUTPATIENT_CLINIC_OR_DEPARTMENT_OTHER): Payer: Self-pay | Admitting: General Surgery

## 2021-02-10 ENCOUNTER — Ambulatory Visit: Payer: Medicare PPO | Admitting: Hematology and Oncology

## 2021-02-10 ENCOUNTER — Other Ambulatory Visit: Payer: Self-pay | Admitting: General Surgery

## 2021-02-10 LAB — SURGICAL PATHOLOGY

## 2021-02-13 NOTE — Progress Notes (Signed)

## 2021-02-13 NOTE — Anesthesia Preprocedure Evaluation (Addendum)
Anesthesia Evaluation  Patient identified by MRN, date of birth, ID band Patient awake    Reviewed: Allergy & Precautions, NPO status , Patient's Chart, lab work & pertinent test results, reviewed documented beta blocker date and time   History of Anesthesia Complications Negative for: history of anesthetic complications  Airway Mallampati: II  TM Distance: >3 FB Neck ROM: Full    Dental no notable dental hx. (+) Chipped, Missing, Poor Dentition, Dental Advisory Given,    Pulmonary neg pulmonary ROS,    Pulmonary exam normal breath sounds clear to auscultation       Cardiovascular hypertension, Pt. on medications and Pt. on home beta blockers negative cardio ROS Normal cardiovascular exam Rhythm:Regular Rate:Normal     Neuro/Psych negative neurological ROS  negative psych ROS   GI/Hepatic negative GI ROS, Neg liver ROS, GERD  Controlled,  Endo/Other  negative endocrine ROSMorbid obesity  Renal/GU negative Renal ROSCr 1.39  negative genitourinary   Musculoskeletal negative musculoskeletal ROS (+) Arthritis ,   Abdominal   Peds negative pediatric ROS (+)  Hematology negative hematology ROS (+)   Anesthesia Other Findings Day of surgery medications reviewed with patient.  Reproductive/Obstetrics negative OB ROS                            Anesthesia Physical  Anesthesia Plan  ASA: 3  Anesthesia Plan: General   Post-op Pain Management:    Induction: Intravenous  PONV Risk Score and Plan: 3 and Treatment may vary due to age or medical condition, Ondansetron and Dexamethasone  Airway Management Planned: LMA  Additional Equipment: None  Intra-op Plan:   Post-operative Plan: Extubation in OR  Informed Consent: I have reviewed the patients History and Physical, chart, labs and discussed the procedure including the risks, benefits and alternatives for the proposed anesthesia with the  patient or authorized representative who has indicated his/her understanding and acceptance.     Dental advisory given  Plan Discussed with: CRNA and Anesthesiologist  Anesthesia Plan Comments:         Anesthesia Quick Evaluation

## 2021-02-14 ENCOUNTER — Ambulatory Visit (HOSPITAL_BASED_OUTPATIENT_CLINIC_OR_DEPARTMENT_OTHER)
Admission: RE | Admit: 2021-02-14 | Discharge: 2021-02-14 | Disposition: A | Discharge status: Home / Self Care | Payer: Medicare PPO | Attending: General Surgery | Admitting: General Surgery

## 2021-02-14 ENCOUNTER — Encounter (HOSPITAL_BASED_OUTPATIENT_CLINIC_OR_DEPARTMENT_OTHER): Payer: Self-pay | Admitting: General Surgery

## 2021-02-14 ENCOUNTER — Other Ambulatory Visit: Payer: Self-pay

## 2021-02-14 ENCOUNTER — Ambulatory Visit (HOSPITAL_BASED_OUTPATIENT_CLINIC_OR_DEPARTMENT_OTHER): Payer: Medicare PPO | Admitting: Anesthesiology

## 2021-02-14 ENCOUNTER — Encounter (HOSPITAL_BASED_OUTPATIENT_CLINIC_OR_DEPARTMENT_OTHER)
Admission: RE | Disposition: A | Discharge status: Home / Self Care | Payer: Self-pay | Source: Home / Self Care | Attending: General Surgery

## 2021-02-14 DIAGNOSIS — K219 Gastro-esophageal reflux disease without esophagitis: Secondary | ICD-10-CM | POA: Diagnosis not present

## 2021-02-14 DIAGNOSIS — I10 Essential (primary) hypertension: Secondary | ICD-10-CM | POA: Insufficient documentation

## 2021-02-14 DIAGNOSIS — C50912 Malignant neoplasm of unspecified site of left female breast: Secondary | ICD-10-CM | POA: Diagnosis not present

## 2021-02-14 DIAGNOSIS — Z8041 Family history of malignant neoplasm of ovary: Secondary | ICD-10-CM | POA: Insufficient documentation

## 2021-02-14 DIAGNOSIS — R921 Mammographic calcification found on diagnostic imaging of breast: Secondary | ICD-10-CM | POA: Insufficient documentation

## 2021-02-14 DIAGNOSIS — Z6839 Body mass index (BMI) 39.0-39.9, adult: Secondary | ICD-10-CM | POA: Diagnosis not present

## 2021-02-14 DIAGNOSIS — Z170 Estrogen receptor positive status [ER+]: Secondary | ICD-10-CM | POA: Insufficient documentation

## 2021-02-14 DIAGNOSIS — N6011 Diffuse cystic mastopathy of right breast: Secondary | ICD-10-CM | POA: Diagnosis not present

## 2021-02-14 HISTORY — PX: RE-EXCISION OF BREAST LUMPECTOMY: SHX6048

## 2021-02-14 SURGERY — EXCISION, LESION, BREAST
Anesthesia: General | Site: Breast | Laterality: Right

## 2021-02-14 MED ORDER — OXYCODONE HCL 5 MG PO TABS
5.0000 mg | ORAL_TABLET | Freq: Once | ORAL | Status: AC | PRN
  Administered 2021-02-14: 5 mg via ORAL

## 2021-02-14 MED ORDER — EPHEDRINE SULFATE (PRESSORS) 50 MG/ML IJ SOLN
INTRAMUSCULAR | Status: DC | PRN
  Administered 2021-02-14 (×2): 15 mg via INTRAVENOUS

## 2021-02-14 MED ORDER — SUCCINYLCHOLINE CHLORIDE 200 MG/10ML IV SOSY
PREFILLED_SYRINGE | INTRAVENOUS | Status: AC
  Filled 2021-02-14: qty 10

## 2021-02-14 MED ORDER — GLYCOPYRROLATE PF 0.2 MG/ML IJ SOSY
PREFILLED_SYRINGE | INTRAMUSCULAR | Status: AC
  Filled 2021-02-14: qty 1

## 2021-02-14 MED ORDER — DEXAMETHASONE SODIUM PHOSPHATE 4 MG/ML IJ SOLN
INTRAMUSCULAR | Status: DC | PRN
  Administered 2021-02-14: 5 mg via INTRAVENOUS

## 2021-02-14 MED ORDER — FENTANYL CITRATE (PF) 100 MCG/2ML IJ SOLN
INTRAMUSCULAR | Status: AC
  Filled 2021-02-14: qty 2

## 2021-02-14 MED ORDER — ACETAMINOPHEN 325 MG PO TABS: 325.0000 mg | ORAL_TABLET | ORAL | Status: DC | PRN

## 2021-02-14 MED ORDER — CEFAZOLIN SODIUM-DEXTROSE 2-4 GM/100ML-% IV SOLN
2.0000 g | INTRAVENOUS | Status: AC
  Administered 2021-02-14: 2 g via INTRAVENOUS

## 2021-02-14 MED ORDER — ACETAMINOPHEN 160 MG/5ML PO SOLN: 325.0000 mg | ORAL | Status: DC | PRN

## 2021-02-14 MED ORDER — GLYCOPYRROLATE 0.2 MG/ML IJ SOLN
INTRAMUSCULAR | Status: DC | PRN
  Administered 2021-02-14: .2 mg via INTRAVENOUS

## 2021-02-14 MED ORDER — ACETAMINOPHEN 500 MG PO TABS
ORAL_TABLET | ORAL | Status: AC
  Filled 2021-02-14: qty 2

## 2021-02-14 MED ORDER — LACTATED RINGERS IV SOLN: INTRAVENOUS | Status: DC

## 2021-02-14 MED ORDER — ONDANSETRON HCL 4 MG/2ML IJ SOLN
INTRAMUSCULAR | Status: DC | PRN
  Administered 2021-02-14: 4 mg via INTRAVENOUS

## 2021-02-14 MED ORDER — FENTANYL CITRATE (PF) 100 MCG/2ML IJ SOLN
INTRAMUSCULAR | Status: DC | PRN
  Administered 2021-02-14: 50 ug via INTRAVENOUS

## 2021-02-14 MED ORDER — MEPERIDINE HCL 25 MG/ML IJ SOLN: 6.2500 mg | INTRAMUSCULAR | Status: DC | PRN

## 2021-02-14 MED ORDER — PROPOFOL 10 MG/ML IV BOLUS
INTRAVENOUS | Status: DC | PRN
  Administered 2021-02-14: 150 mg via INTRAVENOUS
  Administered 2021-02-14: 50 mg via INTRAVENOUS

## 2021-02-14 MED ORDER — ONDANSETRON HCL 4 MG/2ML IJ SOLN: 4.0000 mg | Freq: Once | INTRAMUSCULAR | Status: DC | PRN

## 2021-02-14 MED ORDER — LIDOCAINE 2% (20 MG/ML) 5 ML SYRINGE
INTRAMUSCULAR | Status: AC
  Filled 2021-02-14: qty 5

## 2021-02-14 MED ORDER — SUCCINYLCHOLINE CHLORIDE 200 MG/10ML IV SOSY
PREFILLED_SYRINGE | INTRAVENOUS | Status: DC | PRN
Start: 2021-02-14 — End: 2021-02-14
  Administered 2021-02-14: 200 mg via INTRAVENOUS

## 2021-02-14 MED ORDER — CEFAZOLIN SODIUM-DEXTROSE 2-4 GM/100ML-% IV SOLN
INTRAVENOUS | Status: AC
  Filled 2021-02-14: qty 100

## 2021-02-14 MED ORDER — BUPIVACAINE HCL (PF) 0.25 % IJ SOLN
INTRAMUSCULAR | Status: AC
  Filled 2021-02-14: qty 30

## 2021-02-14 MED ORDER — CHLORHEXIDINE GLUCONATE CLOTH 2 % EX PADS: 6.0000 | MEDICATED_PAD | Freq: Once | CUTANEOUS | Status: DC

## 2021-02-14 MED ORDER — EPHEDRINE 5 MG/ML INJ
INTRAVENOUS | Status: AC
  Filled 2021-02-14: qty 5

## 2021-02-14 MED ORDER — OXYCODONE HCL 5 MG PO TABS
ORAL_TABLET | ORAL | Status: AC
  Filled 2021-02-14: qty 1

## 2021-02-14 MED ORDER — FENTANYL CITRATE (PF) 100 MCG/2ML IJ SOLN
25.0000 ug | INTRAMUSCULAR | Status: DC | PRN
  Administered 2021-02-14: 25 ug via INTRAVENOUS

## 2021-02-14 MED ORDER — PHENYLEPHRINE 40 MCG/ML (10ML) SYRINGE FOR IV PUSH (FOR BLOOD PRESSURE SUPPORT)
PREFILLED_SYRINGE | INTRAVENOUS | Status: AC
  Filled 2021-02-14: qty 10

## 2021-02-14 MED ORDER — MIDAZOLAM HCL 2 MG/2ML IJ SOLN
INTRAMUSCULAR | Status: AC
  Filled 2021-02-14: qty 2

## 2021-02-14 MED ORDER — DEXAMETHASONE SODIUM PHOSPHATE 10 MG/ML IJ SOLN
INTRAMUSCULAR | Status: AC
  Filled 2021-02-14: qty 1

## 2021-02-14 MED ORDER — LIDOCAINE-EPINEPHRINE (PF) 1 %-1:200000 IJ SOLN
INTRAMUSCULAR | Status: DC | PRN
  Administered 2021-02-14: 30 mL via INTRAMUSCULAR

## 2021-02-14 MED ORDER — ONDANSETRON HCL 4 MG/2ML IJ SOLN
INTRAMUSCULAR | Status: AC
  Filled 2021-02-14: qty 2

## 2021-02-14 MED ORDER — OXYCODONE HCL 5 MG PO TABS: 5.0000 mg | ORAL_TABLET | Freq: Four times a day (QID) | ORAL | 0 refills | Status: DC | PRN

## 2021-02-14 MED ORDER — LIDOCAINE HCL (CARDIAC) PF 100 MG/5ML IV SOSY
PREFILLED_SYRINGE | INTRAVENOUS | Status: DC | PRN
  Administered 2021-02-14: 30 mg via INTRAVENOUS

## 2021-02-14 MED ORDER — LIDOCAINE-EPINEPHRINE (PF) 1 %-1:200000 IJ SOLN
INTRAMUSCULAR | Status: AC
  Filled 2021-02-14: qty 30

## 2021-02-14 MED ORDER — OXYCODONE HCL 5 MG/5ML PO SOLN: 5.0000 mg | Freq: Once | ORAL | Status: AC | PRN

## 2021-02-14 MED ORDER — 0.9 % SODIUM CHLORIDE (POUR BTL) OPTIME
TOPICAL | Status: DC | PRN
  Administered 2021-02-14: 60 mL

## 2021-02-14 MED ORDER — ACETAMINOPHEN 500 MG PO TABS
1000.0000 mg | ORAL_TABLET | ORAL | Status: AC
  Administered 2021-02-14: 1000 mg via ORAL

## 2021-02-14 MED ORDER — ATROPINE SULFATE 0.4 MG/ML IV SOLN
INTRAVENOUS | Status: AC
  Filled 2021-02-14: qty 1

## 2021-02-14 SURGICAL SUPPLY — 43 items
ADH SKN CLS APL DERMABOND .7 (GAUZE/BANDAGES/DRESSINGS) ×1
APL PRP STRL LF DISP 70% ISPRP (MISCELLANEOUS) ×1
BINDER BREAST XLRG (GAUZE/BANDAGES/DRESSINGS) ×1 IMPLANT
BLADE SURG 10 STRL SS (BLADE) ×2 IMPLANT
CANISTER SUCT 1200ML W/VALVE (MISCELLANEOUS) ×2 IMPLANT
CHLORAPREP W/TINT 26 (MISCELLANEOUS) ×2 IMPLANT
CLIP TI LARGE 6 (CLIP) ×2 IMPLANT
COVER BACK TABLE 60X90IN (DRAPES) ×2 IMPLANT
COVER MAYO STAND STRL (DRAPES) ×2 IMPLANT
DERMABOND ADVANCED (GAUZE/BANDAGES/DRESSINGS) ×1
DERMABOND ADVANCED .7 DNX12 (GAUZE/BANDAGES/DRESSINGS) ×1 IMPLANT
DRAPE LAPAROSCOPIC ABDOMINAL (DRAPES) ×2 IMPLANT
DRAPE UTILITY XL STRL (DRAPES) ×2 IMPLANT
ELECT COATED BLADE 2.86 ST (ELECTRODE) ×2 IMPLANT
ELECT REM PT RETURN 9FT ADLT (ELECTROSURGICAL) ×2
ELECTRODE REM PT RTRN 9FT ADLT (ELECTROSURGICAL) ×1 IMPLANT
GAUZE SPONGE 4X4 12PLY STRL (GAUZE/BANDAGES/DRESSINGS) ×1 IMPLANT
GAUZE SPONGE 4X4 12PLY STRL LF (GAUZE/BANDAGES/DRESSINGS) ×2 IMPLANT
GLOVE SURG ENC MOIS LTX SZ6 (GLOVE) ×2 IMPLANT
GLOVE SURG UNDER POLY LF SZ6.5 (GLOVE) ×2 IMPLANT
GOWN STRL REUS W/ TWL LRG LVL3 (GOWN DISPOSABLE) ×1 IMPLANT
GOWN STRL REUS W/TWL 2XL LVL3 (GOWN DISPOSABLE) ×2 IMPLANT
GOWN STRL REUS W/TWL LRG LVL3 (GOWN DISPOSABLE) ×4
KIT MARKER MARGIN INK (KITS) ×2 IMPLANT
NDL HYPO 25X1 1.5 SAFETY (NEEDLE) ×1 IMPLANT
NEEDLE HYPO 25X1 1.5 SAFETY (NEEDLE) ×2 IMPLANT
NS IRRIG 1000ML POUR BTL (IV SOLUTION) ×2 IMPLANT
PACK BASIN DAY SURGERY FS (CUSTOM PROCEDURE TRAY) ×2 IMPLANT
PENCIL SMOKE EVACUATOR (MISCELLANEOUS) ×2 IMPLANT
SLEEVE SCD COMPRESS KNEE MED (STOCKING) ×2 IMPLANT
SPONGE T-LAP 18X18 ~~LOC~~+RFID (SPONGE) ×3 IMPLANT
STRIP CLOSURE SKIN 1/2X4 (GAUZE/BANDAGES/DRESSINGS) ×2 IMPLANT
SUT MNCRL AB 4-0 PS2 18 (SUTURE) ×1 IMPLANT
SUT MON AB 4-0 PC3 18 (SUTURE) ×2 IMPLANT
SUT SILK 2 0 SH (SUTURE) IMPLANT
SUT VIC AB 2-0 PS2 27 (SUTURE) ×2 IMPLANT
SUT VIC AB 3-0 SH 27 (SUTURE) ×2
SUT VIC AB 3-0 SH 27X BRD (SUTURE) ×1 IMPLANT
SYR BULB EAR ULCER 3OZ GRN STR (SYRINGE) ×2 IMPLANT
SYR CONTROL 10ML LL (SYRINGE) ×2 IMPLANT
TOWEL GREEN STERILE FF (TOWEL DISPOSABLE) ×2 IMPLANT
TUBE CONNECTING 20X1/4 (TUBING) ×2 IMPLANT
YANKAUER SUCT BULB TIP NO VENT (SUCTIONS) ×2 IMPLANT

## 2021-02-14 NOTE — Transfer of Care (Signed)
Immediate Anesthesia Transfer of Care Note  Patient: Vickie Collier  Procedure(s) Performed: RE-EXCISION OF RIGHT BREAST LUMPECTOMY (Right: Breast)  Patient Location: PACU  Anesthesia Type:General  Level of Consciousness: awake, alert , oriented, drowsy and patient cooperative  Airway & Oxygen Therapy: Patient Spontanous Breathing and Patient connected to face mask oxygen  Post-op Assessment: Report given to RN and Post -op Vital signs reviewed and stable  Post vital signs: Reviewed and stable  Last Vitals:  Vitals Value Taken Time  BP 171/96 02/14/21 0924  Temp    Pulse 74 02/14/21 0926  Resp 24 02/14/21 0926  SpO2 100 % 02/14/21 0926  Vitals shown include unvalidated device data.  Last Pain:  Vitals:   02/14/21 0644  TempSrc: Oral  PainSc: 0-No pain      Patients Stated Pain Goal: 4 (97/53/00 5110)  Complications: No notable events documented.

## 2021-02-14 NOTE — OR Nursing (Signed)
Peri-care performed

## 2021-02-14 NOTE — Discharge Instructions (Addendum)
[place] Office Phone Number [phone]  BREAST BIOPSY/ PARTIAL MASTECTOMY: POST OP INSTRUCTIONS  Always review your discharge instruction sheet given to you by the facility where your surgery was performed.  IF YOU HAVE DISABILITY OR FAMILY LEAVE FORMS, YOU MUST BRING THEM TO THE OFFICE FOR PROCESSING.  DO NOT GIVE THEM TO YOUR DOCTOR.  A prescription for pain medication may be given to you upon discharge.  Take your pain medication as prescribed, if needed.  If narcotic pain medicine is not needed, then you may take acetaminophen (Tylenol) or ibuprofen (Advil) as needed. Take your usually prescribed medications unless otherwise directed If you need a refill on your pain medication, please contact your pharmacy.  They will contact our office to request authorization.  Prescriptions will not be filled after 5pm or on week-ends. You should eat very light the first 24 hours after surgery, such as soup, crackers, pudding, etc.  Resume your normal diet the day after surgery. Most patients will experience some swelling and bruising in the breast.  Ice packs and a good support bra will help.  Swelling and bruising can take several days to resolve.  It is common to experience some constipation if taking pain medication after surgery.  Increasing fluid intake and taking a stool softener will usually help or prevent this problem from occurring.  A mild laxative (Milk of Magnesia or Miralax) should be taken according to package directions if there are no bowel movements after 48 hours. Unless discharge instructions indicate otherwise, you may remove your bandages 48 hours after surgery, and you may shower at that time.  You may have steri-strips (small skin tapes) in place directly over the incision.  These strips should be left on the skin for 7-10 days.   Any sutures or staples will be removed at the office during your follow-up visit. ACTIVITIES:  You may resume regular daily activities  (gradually increasing) beginning the next day.  Wearing a good support bra or sports bra (or the breast binder) minimizes pain and swelling.  You may have sexual intercourse when it is comfortable. You may drive when you no longer are taking prescription pain medication, you can comfortably wear a seatbelt, and you can safely maneuver your car and apply brakes. RETURN TO WORK:  __________1 week_______________ Dennis Bast should see your doctor in the office for a follow-up appointment approximately two weeks after your surgery.  Your doctors nurse will typically make your follow-up appointment when she calls you with your pathology report.  Expect your pathology report 2-3 business days after your surgery.  You may call to check if you do not hear from [place] after three days.   WHEN TO CALL YOUR DOCTOR: Fever over 101.0 Nausea and/or vomiting. Extreme swelling or bruising. Continued bleeding from incision. Increased pain, redness, or drainage from the incision.  The clinic staff is available to answer your questions during regular business hours.  Please dont hesitate to call and ask to speak to one of the nurses for clinical concerns.  If you have a medical emergency, go to the nearest emergency room or call 911.  A surgeon from [facility] is always on call at the hospital.  For further questions, please visit [email or web address]    Post Anesthesia Home Care Instructions  Activity: Get plenty of rest for the remainder of the day. A responsible individual must stay with you for 24 hours following the procedure.  For the next 24 hours, DO NOT: -Drive a car -Operate  machinery -Drink alcoholic beverages -Take any medication unless instructed by your physician -Make any legal decisions or sign important papers.  Meals: Start with liquid foods such as gelatin or soup. Progress to regular foods as tolerated. Avoid greasy, spicy, heavy foods. If nausea and/or vomiting occur, drink  only clear liquids until the nausea and/or vomiting subsides. Call your physician if vomiting continues.  Special Instructions/Symptoms: Your throat may feel dry or sore from the anesthesia or the breathing tube placed in your throat during surgery. If this causes discomfort, gargle with warm salt water. The discomfort should disappear within 24 hours.    Ok to take Tylenol today at 1pm if needed.

## 2021-02-14 NOTE — Anesthesia Postprocedure Evaluation (Signed)
Anesthesia Post Note  Patient: Vickie Collier  Procedure(s) Performed: RE-EXCISION OF RIGHT BREAST LUMPECTOMY (Right: Breast)     Patient location during evaluation: PACU Anesthesia Type: General Level of consciousness: awake and alert Pain management: pain level controlled Vital Signs Assessment: post-procedure vital signs reviewed and stable Respiratory status: spontaneous breathing, nonlabored ventilation, respiratory function stable and patient connected to nasal cannula oxygen Cardiovascular status: blood pressure returned to baseline and stable Postop Assessment: no apparent nausea or vomiting Anesthetic complications: no   No notable events documented.  Last Vitals:  Vitals:   02/14/21 0948 02/14/21 0953  BP:    Pulse: 68 76  Resp: (!) 9 18  Temp:    SpO2: 99% 98%    Last Pain:  Vitals:   02/14/21 1000  TempSrc:   PainSc: 8                  Stillman Buenger

## 2021-02-14 NOTE — Op Note (Signed)
Re-excisional Right Breast Lumpectomy   Indications: This patient presents with history of positive margins after seed localized excisional biopsy with new diagnosis of grade 1 invasive lobular cancer.  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  +/+/- (100% strong for ER and PR), upper outer quadrant  Pre-operative Diagnosis: Right breast cancer   Post-operative Diagnosis: right breast cancer   Surgeon: Stark Klein   Assistants: n/a   Anesthesia: General anesthesia and Local anesthesia   ASA Class: 3   Procedure Details  The patient was seen in the Holding Room. The risks, benefits, complications, treatment options, and expected outcomes were discussed with the patient. The possibilities of reaction to medication, pulmonary aspiration, bleeding, infection, the need for additional procedures, failure to diagnose a condition, and creating a complication requiring transfusion or operation were discussed with the patient. The patient concurred with the proposed plan, giving informed consent. The site of surgery properly noted/marked. The patient was taken to Operating Room # 5, identified, and the procedure verified as re-excision of right breast cancer.  After induction of anesthesia, the right breast and chest were prepped and draped in standard fashion.  The lumpectomy was performed by reopening the prior incision. Seroma was aspirated. The mastopexy sutures were removed. Additional margins were taken at all borders of the partial mastectomy cavity. Dissection was carried down to the pectoral fascia. Orientation sutures were placed in the specimens. Hemostasis was achieved with cautery. Clips were placed at the borders. Several 2-0 vicryl mastopexy sutures were placed to decrease the defect.  The wound was irrigated and closed with a 3-0 Vicryl deep dermal interrupted and a 4-0 Monocryl subcuticular closure in layers. Sterile dressings were applied. At the end of the operation, all sponge,  instrument, and needle counts were correct.   Findings:  grossly clear surgical margins, posterior margin is pectoralis, anterior margin is skin  Estimated Blood Loss: Minimal   Drains: none   Specimens: additional lateral, superior, medial, inferior, posterior and anterior margins.   Complications: None; patient tolerated the procedure well.   Disposition: PACU - hemodynamically stable.   Condition: stable

## 2021-02-14 NOTE — Interval H&P Note (Signed)
History and Physical Interval Note:  02/14/2021 7:41 AM  Vickie Collier  has presented today for surgery, with the diagnosis of RIGHT BREAST CANCER.  The various methods of treatment have been discussed with the patient and family. After consideration of risks, benefits and other options for treatment, the patient has consented to  Procedure(s): RE-EXCISION OF RIGHT BREAST LUMPECTOMY (Right) as a surgical intervention.  The patient's history has been reviewed, patient examined, no change in status, stable for surgery.  I have reviewed the patient's chart and labs.  Questions were answered to the patient's satisfaction.     Stark Klein

## 2021-02-14 NOTE — Addendum Note (Signed)
Addendum  created 02/14/21 1114 by Willa Frater, CRNA   Charge Capture section accepted

## 2021-02-14 NOTE — Anesthesia Procedure Notes (Signed)

## 2021-02-14 NOTE — Anesthesia Procedure Notes (Signed)
Procedure Name: LMA Insertion Date/Time: 02/14/2021 8:01 AM Performed by: Willa Frater, CRNA Pre-anesthesia Checklist: Patient identified, Emergency Drugs available, Suction available and Patient being monitored Patient Re-evaluated:Patient Re-evaluated prior to induction Oxygen Delivery Method: Circle system utilized Preoxygenation: Pre-oxygenation with 100% oxygen Induction Type: IV induction Ventilation: Mask ventilation without difficulty LMA: LMA inserted LMA Size: 4.0 Number of attempts: 1 Airway Equipment and Method: Bite block Placement Confirmation: positive ETCO2 Tube secured with: Tape Dental Injury: Teeth and Oropharynx as per pre-operative assessment

## 2021-02-15 ENCOUNTER — Encounter (HOSPITAL_BASED_OUTPATIENT_CLINIC_OR_DEPARTMENT_OTHER): Payer: Self-pay | Admitting: General Surgery

## 2021-02-15 NOTE — Progress Notes (Signed)
[facility] CONSULT NOTE  Patient Care Team: System, Provider Not In as PCP - General  CHIEF COMPLAINTS/PURPOSE OF CONSULTATION:  History of right breast ADH  HISTORY OF PRESENTING ILLNESS:  Vickie Collier 71 y.o. female is here because of history of right breast ADH. Screening mammogram on September 2022 showed right breast asymmetry with calcifications; biopsy showed ADH.  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  She presents to the clinic today for evaluation and discussion of treatment options.   I reviewed her records extensively and collaborated the history with the patient.  SUMMARY OF ONCOLOGIC HISTORY: Oncology History  Malignant neoplasm of upper-outer quadrant of right breast in female, estrogen receptor positive ([place])  01/25/2021 Surgery   Right lumpectomy: Grade 1 invasive lobular cancer, 4 mm, LCIS, DCIS grade 1-2, margins positive, DCIS posterior margin positive, ER 100%, PR 100%, Ki-67 10%, HER2 2+ by IHC, FISH negative     MEDICAL HISTORY:  Past Medical History:  Diagnosis Date   Arthritis    Atypical ductal hyperplasia of right breast    GERD (gastroesophageal reflux disease)    Hyperlipidemia    Hypertension     SURGICAL HISTORY: Past Surgical History:  Procedure Laterality Date   ABDOMINAL HYSTERECTOMY     JOINT REPLACEMENT Bilateral    TKR   RADIOACTIVE SEED GUIDED EXCISIONAL BREAST BIOPSY Right 01/25/2021   Procedure: RADIOACTIVE SEED GUIDED EXCISIONAL RIGHT BREAST BIOPSY;  Surgeon: Stark Klein, MD;  Location: [facility];  Service: General;  Laterality: Right;   RE-EXCISION OF BREAST LUMPECTOMY Right 02/14/2021   Procedure: RE-EXCISION OF RIGHT BREAST LUMPECTOMY;  Surgeon: Stark Klein, MD;  Location: [facility];  Service: General;  Laterality: Right;    SOCIAL HISTORY: Social History    Socioeconomic History   Marital status: Single    Spouse name: Not on file   Number of children: Not on file   Years of education: Not on file   Highest education level: Not on file  Occupational History   Not on file  Tobacco Use   Smoking status: Never   Smokeless tobacco: Never  Substance and Sexual Activity   Alcohol use: Not Currently   Drug use: Never   Sexual activity: Not Currently    Birth control/protection: Surgical  Other Topics Concern   Not on file  Social History Narrative   Not on file   Social Determinants of Health   Financial Resource Strain: Not on file  Food Insecurity: Not on file  Transportation Needs: Not on file  Physical Activity: Not on file  Stress: Not on file  Social Connections: Not on file  Intimate Partner Violence: Not on file    FAMILY HISTORY: No family history on file.  ALLERGIES:  has No Known Allergies.  MEDICATIONS:  Current Outpatient Medications  Medication Sig Dispense Refill   aspirin EC 81 MG tablet Take 81 mg by mouth daily. Swallow whole.     cyanocobalamin 100 MCG tablet Take 100 mcg by mouth daily.     [START ON 03/15/2021] letrozole (FEMARA) 2.5 MG tablet Take 1 tablet (2.5 mg total) by mouth daily. 90 tablet 3   amLODipine (NORVASC) 10 MG tablet Take 10 mg by mouth daily.     baclofen (LIORESAL) 10 MG tablet Take 10 mg by mouth 2 (two) times daily.     cholecalciferol (VITAMIN  D3) 25 MCG (1000 UNIT) tablet Take 1,000 Units by mouth daily.     felodipine (PLENDIL) 5 MG 24 hr tablet Take 5 mg by mouth daily.     metoprolol succinate (TOPROL-XL) 50 MG 24 hr tablet Take 50 mg by mouth daily. Take with or immediately following a meal.     oxyCODONE (OXY IR/ROXICODONE) 5 MG immediate release tablet Take 1 tablet (5 mg total) by mouth every 6 (six) hours as needed for severe pain. 8 tablet 0   pregabalin (LYRICA) 150 MG capsule Take 150 mg by mouth 2 (two) times daily.     rosuvastatin (CRESTOR) 5 MG tablet Take 5 mg by  mouth daily.     valsartan-hydrochlorothiazide (DIOVAN-HCT) 80-12.5 MG tablet Take 1 tablet by mouth daily.     No current facility-administered medications for this visit.    REVIEW OF SYSTEMS:   Constitutional: Denies fevers, chills or abnormal night sweats Eyes: Denies blurriness of vision, double vision or watery eyes Ears, nose, mouth, throat, and face: Denies mucositis or sore throat Respiratory: Denies cough, dyspnea or wheezes Cardiovascular: Denies palpitation, chest discomfort or lower extremity swelling Gastrointestinal:  Denies nausea, heartburn or change in bowel habits Skin: Denies abnormal skin rashes Lymphatics: Denies new lymphadenopathy or easy bruising Neurological:Denies numbness, tingling or new weaknesses Behavioral/Psych: Mood is stable, no new changes  Breast: Recent right lumpectomy All other systems were reviewed with the patient and are negative.  PHYSICAL EXAMINATION: ECOG PERFORMANCE STATUS: 1 - Symptomatic but completely ambulatory  Vitals:   02/16/21 1321  BP: (!) 182/77  Pulse: (!) 50  Temp: 97.8 F (36.6 C)  SpO2: 97%   Filed Weights   02/16/21 1321  Weight: 248 lb 12.8 oz (112.9 kg)     LABORATORY DATA:  I have reviewed the data as listed No results found for: WBC, HGB, HCT, MCV, PLT Lab Results  Component Value Date   NA 139 01/20/2021   K 4.3 01/20/2021   CL 105 01/20/2021   CO2 24 01/20/2021    RADIOGRAPHIC STUDIES: I have personally reviewed the radiological reports and agreed with the findings in the report.  ASSESSMENT AND PLAN:  Malignant neoplasm of upper-outer quadrant of right breast in female, estrogen receptor positive ([place]) 01/25/2021:Right lumpectomy: Grade 1 invasive lobular cancer, 4 mm, LCIS, DCIS grade 1-2, margins positive, DCIS posterior margin positive, ER 100%, PR 100%, Ki-67 10%, HER2 2+ by IHC, FISH negative  Pathology and radiology counseling:Discussed with the patient, the details of pathology including  the type of breast cancer,the clinical staging, the significance of ER, PR and HER-2/neu receptors and the implications for treatment. After reviewing the pathology in detail, we proceeded to discuss the different treatment options between reresection surgery, radiation, antiestrogen therapies.  Recommendations: 1.  Resection of the positive margins 02/15/2020: Pathology is pending (sentinel lymph node biopsy done) 2. +/- Adjuvant radiation therapy   3. 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  She had knee replacement surgery but has discomfort while walking long distances.   I sent a prescription for antiestrogen therapy which she will begin 03/15/2021. We will request radiation oncology consultation to discuss the pros and cons of adjuvant radiation.  All questions were answered. The patient knows to call the clinic with any problems, questions or concerns.   Rulon Eisenmenger, MD, MPH 02/16/2021    I, Thana Ates, am acting as scribe for Nicholas Lose, MD.  I have  reviewed the above documentation for accuracy and completeness, and I agree with the above.

## 2021-02-16 ENCOUNTER — Inpatient Hospital Stay: Payer: Medicare PPO | Attending: Hematology and Oncology | Admitting: Hematology and Oncology

## 2021-02-16 ENCOUNTER — Other Ambulatory Visit: Payer: Self-pay

## 2021-02-16 DIAGNOSIS — Z79899 Other long term (current) drug therapy: Secondary | ICD-10-CM | POA: Diagnosis not present

## 2021-02-16 DIAGNOSIS — Z993 Dependence on wheelchair: Secondary | ICD-10-CM | POA: Insufficient documentation

## 2021-02-16 DIAGNOSIS — C50411 Malignant neoplasm of upper-outer quadrant of right female breast: Secondary | ICD-10-CM | POA: Diagnosis present

## 2021-02-16 DIAGNOSIS — M170 Bilateral primary osteoarthritis of knee: Secondary | ICD-10-CM

## 2021-02-16 DIAGNOSIS — Z96659 Presence of unspecified artificial knee joint: Secondary | ICD-10-CM | POA: Insufficient documentation

## 2021-02-16 DIAGNOSIS — Z170 Estrogen receptor positive status [ER+]: Secondary | ICD-10-CM | POA: Insufficient documentation

## 2021-02-16 MED ORDER — LETROZOLE 2.5 MG PO TABS: 2.5000 mg | ORAL_TABLET | Freq: Every day | ORAL | 3 refills | Status: DC

## 2021-02-16 NOTE — Assessment & Plan Note (Signed)
01/25/2021:Right lumpectomy: Grade 1 invasive lobular cancer, 4 mm, LCIS, DCIS grade 1-2, margins positive, DCIS posterior margin positive, ER 100%, PR 100%, Ki-67 10%, HER2 2+ by IHC, FISH negative  Pathology and radiology counseling:Discussed with the patient, the details of pathology including the type of breast cancer,the clinical staging, the significance of ER, PR and HER-2/neu receptors and the implications for treatment. After reviewing the pathology in detail, we proceeded to discuss the different treatment options between reresection surgery, radiation, antiestrogen therapies.  Recommendations: 1.  Resection of the positive margins 2. Adjuvant radiation therapy followed by 3. Adjuvant antiestrogen therapy  Return to clinic after radiation to start antiestrogen therapy

## 2021-02-17 ENCOUNTER — Telehealth: Payer: Self-pay | Admitting: Hematology and Oncology

## 2021-02-17 ENCOUNTER — Encounter: Payer: Self-pay | Admitting: *Deleted

## 2021-02-17 DIAGNOSIS — C50411 Malignant neoplasm of upper-outer quadrant of right female breast: Secondary | ICD-10-CM

## 2021-02-17 DIAGNOSIS — Z170 Estrogen receptor positive status [ER+]: Secondary | ICD-10-CM

## 2021-02-17 NOTE — Telephone Encounter (Signed)
Sch per 2/2 inbasket, pt daughter aware

## 2021-02-20 ENCOUNTER — Inpatient Hospital Stay
Admission: RE | Admit: 2021-02-20 | Discharge: 2021-02-20 | Disposition: A | Discharge status: Home / Self Care | Payer: Self-pay | Source: Ambulatory Visit | Attending: Radiation Oncology | Admitting: Radiation Oncology

## 2021-02-20 ENCOUNTER — Other Ambulatory Visit: Payer: Self-pay | Admitting: Radiation Oncology

## 2021-02-20 DIAGNOSIS — C50411 Malignant neoplasm of upper-outer quadrant of right female breast: Secondary | ICD-10-CM

## 2021-02-20 DIAGNOSIS — Z170 Estrogen receptor positive status [ER+]: Secondary | ICD-10-CM

## 2021-02-20 LAB — SURGICAL PATHOLOGY

## 2021-02-20 NOTE — Progress Notes (Signed)
Radiation Oncology         [phone] ________________________________  Initial Outpatient Consultation - Conducted via telephone due to current COVID-19 concerns for limiting patient exposure  I spoke with the patient to conduct this consult visit via telephone to spare the patient unnecessary potential exposure in the healthcare setting during the current COVID-19 pandemic. The patient was notified in advance and was offered a [place] meeting to allow for face to face communication but unfortunately reported that they did not have the appropriate resources/technology to support such a visit and instead preferred to proceed with a telephone consult.   ________________________________  Name: Vickie Collier        MRN: [id number]  Date of Service: 02/23/2021 DOB: 01-Jan-1951  [id number], Provider Not In  Nicholas Lose, MD     REFERRING PHYSICIAN: Nicholas Lose, MD   DIAGNOSIS: The encounter diagnosis was Malignant neoplasm of upper-outer quadrant of right breast in female, estrogen receptor positive ([place]).   HISTORY OF PRESENT ILLNESS: Vickie Collier is a 71 y.o. female seen in the multidisciplinary breast clinic for a new diagnosis of right breast cancer. The patient was noted to have calcifications and asymmetry in the right breast on screening mammogram.  Further diagnostic imaging showed a 1.2 cm area of calcifications in the upper outer quadrant of the right breast.  She underwent stereotactic biopsy on 10/31/2020 which revealed atypical ductal hyperplasia.  She was counseled on the rationale for lumpectomy and underwent this procedure on 01/25/2021.  This showed a grade 1 invasive ductal carcinoma measuring 4 mm in greatest dimension with a positive margin in the cauterized but indeterminate site and positive posterior margin DCIS was also noted 2 mm from the anterior and 39m from the medial margin.  Her tumor was ER/PR positive, HER2 was negative. She underwent reexcision of her margins on  02/14/2021 which showed positive margin in the anterior specimen, otherwise the additional margins were all clear in the posterior, inferior, superior, lateral, and medial specimens.  She is contacted by phone to discuss adjuvant radiotherapy.    PREVIOUS RADIATION THERAPY: No   PAST MEDICAL HISTORY:  Past Medical History:  Diagnosis Date   Arthritis    Atypical ductal hyperplasia of right breast    GERD (gastroesophageal reflux disease)    Hyperlipidemia    Hypertension        PAST SURGICAL HISTORY: Past Surgical History:  Procedure Laterality Date   ABDOMINAL HYSTERECTOMY     JOINT REPLACEMENT Bilateral    TKR   RADIOACTIVE SEED GUIDED EXCISIONAL BREAST BIOPSY Right 01/25/2021   Procedure: RADIOACTIVE SEED GUIDED EXCISIONAL RIGHT BREAST BIOPSY;  Surgeon: BStark Klein MD;  [place] [facility]  Service: General;  Laterality: Right;   RE-EXCISION OF BREAST LUMPECTOMY Right 02/14/2021   Procedure: RE-EXCISION OF RIGHT BREAST LUMPECTOMY;  Surgeon: BStark Klein MD;  [facility]  Service: General;  Laterality: Right;     FAMILY HISTORY: No family history on file.   SOCIAL HISTORY:  reports that she has never smoked. She has never used smokeless tobacco. She reports that she does not currently use alcohol. She reports that she does not use drugs. 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   ALLERGIES: Patient has no known allergies.   MEDICATIONS:  Current Outpatient Medications  Medication Sig Dispense Refill   amLODipine (NORVASC) 10 MG tablet Take 10 mg by mouth daily.     aspirin EC  81 MG tablet Take 81 mg by mouth daily. Swallow whole.     baclofen (LIORESAL) 10 MG tablet Take 10 mg by mouth 2 (two) times daily.     cholecalciferol (VITAMIN D3) 25 MCG (1000 UNIT) tablet Take 1,000 Units by mouth daily.     cyanocobalamin 100 MCG tablet Take 100 mcg by mouth daily.     felodipine  (PLENDIL) 5 MG 24 hr tablet Take 5 mg by mouth daily.     [START ON 03/15/2021] letrozole (FEMARA) 2.5 MG tablet Take 1 tablet (2.5 mg total) by mouth daily. 90 tablet 3   metoprolol succinate (TOPROL-XL) 50 MG 24 hr tablet Take 50 mg by mouth daily. Take with or immediately following a meal.     oxyCODONE (OXY IR/ROXICODONE) 5 MG immediate release tablet Take 1 tablet (5 mg total) by mouth every 6 (six) hours as needed for severe pain. 8 tablet 0   pregabalin (LYRICA) 150 MG capsule Take 150 mg by mouth 2 (two) times daily.     rosuvastatin (CRESTOR) 5 MG tablet Take 5 mg by mouth daily.     valsartan-hydrochlorothiazide (DIOVAN-HCT) 80-12.5 MG tablet Take 1 tablet by mouth daily.     No current facility-administered medications for this visit.     REVIEW OF SYSTEMS: On review of systems, the patient reports that she is doing well. She does have some discomfort in the right breast along her incision site, but denies redness, separation or drainage.      PHYSICAL EXAM:  Unable to assess due to encounter type.    ECOG = 1  0 - Asymptomatic (Fully active, able to carry on all predisease activities without restriction)  1 - Symptomatic but completely ambulatory (Restricted in physically strenuous activity but ambulatory and able to carry out work of a light or sedentary nature. For example, light housework, office work)  2 - Symptomatic, <50% in bed during the day (Ambulatory and capable of all self care but unable to carry out any work activities. Up and about more than 50% of waking hours)  3 - Symptomatic, >50% in bed, but not bedbound (Capable of only limited self-care, confined to bed or chair 50% or more of waking hours)  4 - Bedbound (Completely disabled. Cannot carry on any self-care. Totally confined to bed or chair)  5 - Death   Eustace Pen MM, Creech RH, Tormey DC, et al. [phone]). "Toxicity and response criteria of the [facility]". Omega Oncol. 5 (6):  649-55    LABORATORY DATA:  No results found for: WBC, HGB, HCT, MCV, PLT Lab Results  Component Value Date   NA 139 01/20/2021   K 4.3 01/20/2021   CL 105 01/20/2021   CO2 24 01/20/2021   No results found for: ALT, AST, GGT, ALKPHOS, BILITOT    RADIOGRAPHY: MM Breast Surgical Specimen  Result Date: 01/25/2021 CLINICAL DATA:  Status post surgical excision today after earlier preoperative radioactive seed localizations. EXAM: SPECIMEN RADIOGRAPH OF THE RIGHT BREAST COMPARISON:  Previous exam(s). FINDINGS: Status post excision of the right breast. The 2 radioactive seeds and biopsy marker clip are present and appear completely intact within the specimen. Findings discussed with the OR staff during the procedure. IMPRESSION: Specimen radiograph of the right breast. Electronically Signed   By: Franki Cabot M.D.   On: 01/25/2021 10:25  MM RT RADIOACTIVE SEED LOC MAMMO GUIDE  Addendum Date: 01/24/2021   ADDENDUM REPORT: 01/24/2021 14:14 ADDENDUM: Exam should read: MAMMOGRAPHIC GUIDED RADIOACTIVE  SEED LOCALIZATION OF THE RIGHT BREAST X 2 The location of the 2 radioactive seeds placed today were discussed with Dr. Barry Dienes at the conclusion of today's procedure. Electronically Signed   By: Franki Cabot M.D.   On: 01/24/2021 14:14   Result Date: 01/24/2021 CLINICAL DATA:  Patient with outside diagnostic workup and biopsy which revealed atypical ductal hyperplasia. Patient is scheduled for surgical excision requiring preoperative radioactive seed localization. EXAM: MAMMOGRAPHIC GUIDED RADIOACTIVE SEED LOCALIZATION OF THE RIGHT BREAST COMPARISON:  Previous exam(s). FINDINGS: Patient presents for radioactive seed localization prior to surgical excision. I met with the patient and we discussed the procedure of seed localization including benefits and alternatives. We discussed the high likelihood of a successful procedure. We discussed the risks of the procedure including infection, bleeding, tissue  injury and further surgery. We discussed the low dose of radioactivity involved in the procedure. Informed, written consent was given. Preprocedure CC and lateral views were obtained today. Based on today's lateral view, an outside diagnostic magnification views, the biopsy clip may be displaced 1-2 cm inferiorly from the targeted calcifications. As such, a 2 site localization procedure will be performed today with seed localization of both the clip and the grouped calcifications superior to the clip. The usual time-out protocol was performed immediately prior to the procedure. Site 1: Using mammographic guidance, sterile technique, 1% lidocaine and an I-125 radioactive seed, the biopsy clip within the lower outer quadrant of the RIGHT breast was localized using a lateral approach. The follow-up mammogram images confirm the seed in the expected location and were marked for Dr. Barry Dienes. Follow-up survey of the patient confirms presence of the radioactive seed. Order number of I-125 seed:  [id number]. Total activity:  4.098 millicuries reference Date: 10/21/2020 Site 2: Using mammographic guidance, sterile technique, 1% lidocaine and an I-125 radioactive seed, the grouped calcifications located 1-2 cm superior to the biopsy clip within the outer RIGHT breast or localized using a lateral approach. The follow-up mammogram images confirm the seed in the expected location and were marked for Dr. Barry Dienes. Follow-up survey of the patient confirms presence of the radioactive seed. Order number of I-125 seed:  [id number]. Total activity:  5.621 millicuries reference Date: 10/21/2020 The patient tolerated the procedure well and was released from the [facility]. She was given instructions regarding seed removal. IMPRESSION: 1. Radioactive seed localization RIGHT breast with targeting of the biopsy clip in the lower outer quadrant of the RIGHT breast. No apparent complications. 2. Radioactive seed localization RIGHT breast with  targeting of the grouped calcifications within the outer RIGHT breast that are located 1-2 cm superior and slightly posterior to the biopsy clip. No apparent complications. Electronically Signed: By: Franki Cabot M.D. On: 01/24/2021 14:09   MM RT RADIO SEED EA ADD LESION LOC MAMMO  Addendum Date: 01/24/2021   ADDENDUM REPORT: 01/24/2021 14:14 ADDENDUM: Exam should read: MAMMOGRAPHIC GUIDED RADIOACTIVE SEED LOCALIZATION OF THE RIGHT BREAST X 2 The location of the 2 radioactive seeds placed today were discussed with Dr. Barry Dienes at the conclusion of today's procedure. Electronically Signed   By: Franki Cabot M.D.   On: 01/24/2021 14:14   Result Date: 01/24/2021 CLINICAL DATA:  Patient with outside diagnostic workup and biopsy which revealed atypical ductal hyperplasia. Patient is scheduled for surgical excision requiring preoperative radioactive seed localization. EXAM: MAMMOGRAPHIC GUIDED RADIOACTIVE SEED LOCALIZATION OF THE RIGHT BREAST COMPARISON:  Previous exam(s). FINDINGS: Patient presents for radioactive seed localization prior to surgical excision. I met with the patient and we  discussed the procedure of seed localization including benefits and alternatives. We discussed the high likelihood of a successful procedure. We discussed the risks of the procedure including infection, bleeding, tissue injury and further surgery. We discussed the low dose of radioactivity involved in the procedure. Informed, written consent was given. Preprocedure CC and lateral views were obtained today. Based on today's lateral view, an outside diagnostic magnification views, the biopsy clip may be displaced 1-2 cm inferiorly from the targeted calcifications. As such, a 2 site localization procedure will be performed today with seed localization of both the clip and the grouped calcifications superior to the clip. The usual time-out protocol was performed immediately prior to the procedure. Site 1: Using mammographic guidance,  sterile technique, 1% lidocaine and an I-125 radioactive seed, the biopsy clip within the lower outer quadrant of the RIGHT breast was localized using a lateral approach. The follow-up mammogram images confirm the seed in the expected location and were marked for Dr. Barry Dienes. Follow-up survey of the patient confirms presence of the radioactive seed. Order number of I-125 seed:  [id number]. Total activity:  6.195 millicuries reference Date: 10/21/2020 Site 2: Using mammographic guidance, sterile technique, 1% lidocaine and an I-125 radioactive seed, the grouped calcifications located 1-2 cm superior to the biopsy clip within the outer RIGHT breast or localized using a lateral approach. The follow-up mammogram images confirm the seed in the expected location and were marked for Dr. Barry Dienes. Follow-up survey of the patient confirms presence of the radioactive seed. Order number of I-125 seed:  [id number]. Total activity:  5.809 millicuries reference Date: 10/21/2020 The patient tolerated the procedure well and was released from the [facility]. She was given instructions regarding seed removal. IMPRESSION: 1. Radioactive seed localization RIGHT breast with targeting of the biopsy clip in the lower outer quadrant of the RIGHT breast. No apparent complications. 2. Radioactive seed localization RIGHT breast with targeting of the grouped calcifications within the outer RIGHT breast that are located 1-2 cm superior and slightly posterior to the biopsy clip. No apparent complications. Electronically Signed: By: Franki Cabot M.D. On: 01/24/2021 14:09       IMPRESSION/PLAN: 1. 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 Dr. Lisbeth Renshaw discusses the pathology findings and reviews the nature of early stage breast disease. Dr. Lisbeth Renshaw discusses the rationale for external radiotherapy to the breast  given her positive anterior margin to reduce risks of local recurrence followed by antiestrogen  therapy.  We discussed the risks, benefits, short, and long term effects of radiotherapy, as well as the curative intent, and the patient is interested in proceeding. Dr. Lisbeth Renshaw discusses the delivery and logistics of radiotherapy and anticipates a course of 4 weeks of radiotherapy to the right breast.  She will be contacted to coordinate simulation and for treatment to start around the first week of March for further healing before we proceed. She will sign written consent to proceed at the time of simulation.  Given current concerns for patient exposure during the COVID-19 pandemic, this encounter was conducted via telephone.  The patient has provided two factor identification and has given verbal consent for this type of encounter and has been advised to only accept a meeting of this type in a secure network environment. The time spent during this encounter was 60 minutes including preparation, discussion, and coordination of the patient's care. The attendants for this meeting include Blenda Nicely, RN, Dr. Lisbeth Renshaw, Hayden Pedro  and Daine Gip.  During the encounter,  Blenda Nicely, RN, Dr. Lisbeth Renshaw, and Hayden Pedro were located at [facility] Radiation Oncology Department.  Daine Gip was located at home.   The above documentation reflects my direct findings during this shared patient visit. Please see the separate note by Dr. Lisbeth Renshaw on this date for the remainder of the patient's plan of care.    Carola Rhine, [facility]    **Disclaimer: This note was dictated with voice recognition software. Similar sounding words can inadvertently be transcribed and this note may contain transcription errors which may not have been corrected upon publication of note.**

## 2021-02-22 NOTE — Progress Notes (Signed)
New Breast Cancer Diagnosis: Right Breast- UOQ ° °Did patient present with symptoms (if so, please note symptoms) or screening mammography?:Screening Calcifications and asymmetry noted on mammogram.  ° °Location and Extent of disease :right breast. Located in the upper outer quadrant, Calcifications measured 1.2 cm.  Adenopathy no. ° °Histology per Pathology Report: grade 1, Invasive Ductal Carcinoma measuring 4 mm in greatest dimension with a positive margin in the cauterized but indeterminate site of positive posterior margin DCIS was also noted 2 mm from the anterior and 4 mm from the medial margin. 02/14/2021 ° °Receptor Status: ER(positive), PR (positive), Her2-neu (negative), Ki-(10%) ° °Surgeon and surgical plan, if any:  °Dr. Byerly °02/14/2021:  Re-excision of right breast lumpectomy °01/25/2021: Radioactive seed guided excisional right breast biopsy ° °-Follow-up 03/13/2021 ° ° °Medical oncologist, treatment if any:   °Dr. Gudena 02/16/2021 °-Recommendations: °1.  Resection of the positive margins 02/15/2020: Pathology is pending (sentinel lymph node biopsy done) °2. +/- Adjuvant radiation therapy   °3. Adjuvant antiestrogen therapy- she will begin 03/15/2021. ° °-Genetic counseling 03/22/2021 ° ° °Family History of Breast/Ovarian/Prostate Cancer: Brother had prostate ° °Lymphedema issues, if any:  No    ° °Pain issues, if any: Breast due to surgery and knees due to arthritis.   ° °SAFETY ISSUES: °Prior radiation? No °Pacemaker/ICD? No °Possible current pregnancy? Hysterectomy °Is the patient on methotrexate? No ° °Current Complaints / other details:   ° °

## 2021-02-23 ENCOUNTER — Encounter: Payer: Self-pay | Admitting: *Deleted

## 2021-02-23 ENCOUNTER — Ambulatory Visit
Admission: RE | Admit: 2021-02-23 | Discharge: 2021-02-23 | Disposition: A | Discharge status: Home / Self Care | Payer: Medicare PPO | Source: Ambulatory Visit | Attending: Radiation Oncology | Admitting: Radiation Oncology

## 2021-02-23 ENCOUNTER — Encounter: Payer: Self-pay | Admitting: Radiation Oncology

## 2021-02-23 VITALS — Ht 65.0 in | Wt 248.0 lb

## 2021-02-23 DIAGNOSIS — Z170 Estrogen receptor positive status [ER+]: Secondary | ICD-10-CM

## 2021-02-23 DIAGNOSIS — C50411 Malignant neoplasm of upper-outer quadrant of right female breast: Secondary | ICD-10-CM

## 2021-03-15 ENCOUNTER — Other Ambulatory Visit: Payer: Self-pay

## 2021-03-15 ENCOUNTER — Ambulatory Visit
Admission: RE | Admit: 2021-03-15 | Discharge: 2021-03-15 | Disposition: A | Discharge status: Home / Self Care | Payer: Medicare PPO | Source: Ambulatory Visit | Attending: Radiation Oncology | Admitting: Radiation Oncology

## 2021-03-15 DIAGNOSIS — Z170 Estrogen receptor positive status [ER+]: Secondary | ICD-10-CM | POA: Insufficient documentation

## 2021-03-15 DIAGNOSIS — Z510 Encounter for antineoplastic radiation therapy: Secondary | ICD-10-CM | POA: Insufficient documentation

## 2021-03-15 DIAGNOSIS — C50411 Malignant neoplasm of upper-outer quadrant of right female breast: Secondary | ICD-10-CM | POA: Insufficient documentation

## 2021-03-21 ENCOUNTER — Other Ambulatory Visit: Payer: Self-pay | Admitting: Genetic Counselor

## 2021-03-21 DIAGNOSIS — Z170 Estrogen receptor positive status [ER+]: Secondary | ICD-10-CM

## 2021-03-21 DIAGNOSIS — C50411 Malignant neoplasm of upper-outer quadrant of right female breast: Secondary | ICD-10-CM

## 2021-03-21 DIAGNOSIS — Z510 Encounter for antineoplastic radiation therapy: Secondary | ICD-10-CM | POA: Diagnosis not present

## 2021-03-22 ENCOUNTER — Inpatient Hospital Stay: Payer: Medicare PPO

## 2021-03-22 ENCOUNTER — Ambulatory Visit
Admission: RE | Admit: 2021-03-22 | Discharge: 2021-03-22 | Disposition: A | Discharge status: Home / Self Care | Payer: Medicare PPO | Source: Ambulatory Visit | Attending: Radiation Oncology | Admitting: Radiation Oncology

## 2021-03-22 ENCOUNTER — Encounter: Payer: Self-pay | Admitting: *Deleted

## 2021-03-22 ENCOUNTER — Encounter: Payer: Self-pay | Admitting: Genetic Counselor

## 2021-03-22 ENCOUNTER — Other Ambulatory Visit: Payer: Self-pay

## 2021-03-22 ENCOUNTER — Encounter (HOSPITAL_COMMUNITY): Payer: Self-pay

## 2021-03-22 ENCOUNTER — Inpatient Hospital Stay: Payer: Medicare PPO | Attending: Hematology and Oncology | Admitting: Genetic Counselor

## 2021-03-22 DIAGNOSIS — Z8041 Family history of malignant neoplasm of ovary: Secondary | ICD-10-CM

## 2021-03-22 DIAGNOSIS — C50411 Malignant neoplasm of upper-outer quadrant of right female breast: Secondary | ICD-10-CM

## 2021-03-22 DIAGNOSIS — Z510 Encounter for antineoplastic radiation therapy: Secondary | ICD-10-CM | POA: Diagnosis not present

## 2021-03-22 DIAGNOSIS — Z800 Family history of malignant neoplasm of digestive organs: Secondary | ICD-10-CM

## 2021-03-22 DIAGNOSIS — Z170 Estrogen receptor positive status [ER+]: Secondary | ICD-10-CM | POA: Diagnosis not present

## 2021-03-22 DIAGNOSIS — Z8042 Family history of malignant neoplasm of prostate: Secondary | ICD-10-CM | POA: Diagnosis not present

## 2021-03-22 LAB — GENETIC SCREENING ORDER

## 2021-03-22 NOTE — Progress Notes (Signed)
REFERRING PROVIDER: Stark Klein, MD [address] [place],  [place] [address]  PRIMARY PROVIDER:  System, Provider Not In  PRIMARY REASON FOR VISIT:  1. Family history of prostate cancer   2. Family history of ovarian cancer   3. Family history of pancreatic cancer   4. Family history of stomach cancer   5. Malignant neoplasm of upper-outer quadrant of right breast in female, estrogen receptor positive ([place])      HISTORY OF PRESENT ILLNESS:   Vickie Collier, a 71 y.o. female, was seen for a [facility] cancer genetics consultation at the request of Dr. Barry Dienes due to a personal and family history of cancer.  Vickie Collier presents to clinic today to discuss the possibility of a hereditary predisposition to cancer, genetic testing, and to further clarify her future cancer risks, as well as potential cancer risks for family members.   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 The treatment plan lumpectomy and radiation.   CANCER HISTORY:  Oncology History  Malignant neoplasm of upper-outer quadrant of right breast in female, estrogen receptor positive ([facility])  01/25/2021 Surgery   Right lumpectomy: Grade 1 invasive lobular cancer, 4 mm, LCIS, DCIS grade 1-2, margins positive, DCIS posterior margin positive, ER 100%, PR 100%, Ki-67 10%, HER2 2+ by IHC, FISH negative      RISK FACTORS:  Menarche was at age 11-15.  First live birth at age 89.  OCP use for approximately 0 years.  Ovaries intact: no.  Hysterectomy: yes.  Menopausal status: postmenopausal.  HRT use: 5 years. Colonoscopy: yes; normal. Mammogram within the last year: yes. Number of breast biopsies: 1. Up to date with pelvic exams: n/a. Any excessive radiation exposure in the past: no  Past Medical History:  Diagnosis Date   Arthritis    Atypical ductal hyperplasia of right breast    Breast cancer ([facility]) 01/25/2021   Family history of ovarian cancer     Family history of pancreatic cancer    Family history of prostate cancer    Family history of stomach cancer    GERD (gastroesophageal reflux disease)    Hyperlipidemia    Hypertension     Past Surgical History:  Procedure Laterality Date   ABDOMINAL HYSTERECTOMY     JOINT REPLACEMENT Bilateral    TKR   RADIOACTIVE SEED GUIDED EXCISIONAL BREAST BIOPSY Right 01/25/2021   Procedure: RADIOACTIVE SEED GUIDED EXCISIONAL RIGHT BREAST BIOPSY;  Surgeon: Stark Klein, MD;  Location: [facility];  Service: General;  Laterality: Right;   RE-EXCISION OF BREAST LUMPECTOMY Right 02/14/2021   Procedure: RE-EXCISION OF RIGHT BREAST LUMPECTOMY;  Surgeon: Stark Klein, MD;  Location: [facility];  Service: General;  Laterality: Right;    Social History   Socioeconomic History   Marital status: Single    Spouse name: Not on file   Number of children: Not on file   Years of education: Not on file   Highest education level: Not on file  Occupational History   Not on file  Tobacco Use   Smoking status: Never   Smokeless tobacco: Never  Substance and Sexual Activity   Alcohol use: Not Currently   Drug use: Never   Sexual activity: Not Currently    Birth control/protection: Surgical  Other Topics Concern   Not on file  Social History Narrative   Not on file   Social Determinants of Health  Financial Resource Strain: Not on file  Food Insecurity: Not on file  Transportation Needs: Not on file  Physical Activity: Not on file  Stress: Not on file  Social Connections: Not on file     FAMILY HISTORY:  We obtained a detailed, 4-generation family history.  Significant diagnoses are listed below: Family History  Problem Relation Age of Onset   Lupus Father    Ovarian cancer Maternal Grandmother 87   Prostate cancer Half-Brother 69   Pancreatic cancer Cousin        paternal first cousin   Stomach cancer Cousin        paternal first cousin   Lung cancer  Cousin    Cancer Cousin        NOS     The patient has two sons and a daughter who are cancer free.  She has a paternal half brother and maternal half brother.  The maternal brother had prostate cancer at 29.  Both parents are deceased.  The patient's mother died in an accident at 41.  She had a brother and two sisters who are cancer free.  Her mother had ovarian cancer at 13 and her father died of unknown causes.  The patient's father died of lupus.  He had four sisters and a brother.  One sister had an unknown cancer, one sister had a daughter with pancreatic cancer and a third sister had three sons with cancer - stomach, lung and an unknown cancer.  The grandparents died of heart disease.  Vickie Collier is unaware of previous family history of genetic testing for hereditary cancer risks. Patient's maternal ancestors are of African American descent, and paternal ancestors are of African American descent. There is no reported Ashkenazi Jewish ancestry. There is no known consanguinity.  GENETIC COUNSELING ASSESSMENT: Vickie Collier is a 71 y.o. female with a personal and family history of cancer which is somewhat suggestive of a hereditary cancer syndrome and predisposition to cancer given the combination of cancer in the maternal family. We, therefore, discussed and recommended the following at today's visit.   DISCUSSION: We discussed that, in general, most cancer is not inherited in families, but instead is sporadic or familial. Sporadic cancers occur by chance and typically happen at older ages (>50 years) as this type of cancer is caused by genetic changes acquired during an individuals lifetime. Some families have more cancers than would be expected by chance; however, the ages or types of cancer are not consistent with a known genetic mutation or known genetic mutations have been ruled out. This type of familial cancer is thought to be due to a combination of multiple genetic, environmental, hormonal,  and lifestyle factors. While this combination of factors likely increases the risk of cancer, the exact source of this risk is not currently identifiable or testable.  We discussed that 5 - 10% of breast cancer is hereditary, with most cases associated with BRCA mutations.  There are other genes that can be associated with hereditary breast cancer syndromes.  These include ATM, CHEK2 and PALB2.  We discussed that testing is beneficial for several reasons including knowing how to follow individuals after completing their treatment, identifying whether potential treatment options such as PARP inhibitors would be beneficial, and understand if other family members could be at risk for cancer and allow them to undergo genetic testing.   We reviewed the characteristics, features and inheritance patterns of hereditary cancer syndromes. We also discussed genetic testing, including the appropriate family members to  test, the process of testing, insurance coverage and turn-around-time for results. We discussed the implications of a negative, positive, carrier and/or variant of uncertain significant result. We recommended Vickie Collier pursue genetic testing for the CancerNext-Expanded+RNAinsight gene panel.   The CancerNext-Expanded gene panel offered by [facility] and includes sequencing and rearrangement analysis for the following 77 genes: AIP, ALK, APC*, ATM*, AXIN2, BAP1, BARD1, BLM, BMPR1A, BRCA1*, BRCA2*, BRIP1*, CDC73, CDH1*, CDK4, CDKN1B, CDKN2A, CHEK2*, CTNNA1, DICER1, FANCC, FH, FLCN, GALNT12, KIF1B, LZTR1, MAX, MEN1, MET, MLH1*, MSH2*, MSH3, MSH6*, MUTYH*, NBN, NF1*, NF2, NTHL1, PALB2*, PHOX2B, PMS2*, POT1, PRKAR1A, PTCH1, PTEN*, RAD51C*, RAD51D*, RB1, RECQL, RET, SDHA, SDHAF2, SDHB, SDHC, SDHD, SMAD4, SMARCA4, SMARCB1, SMARCE1, STK11, SUFU, TMEM127, TP53*, TSC1, TSC2, VHL and XRCC2 (sequencing and deletion/duplication); EGFR, EGLN1, HOXB13, KIT, MITF, PDGFRA, POLD1, and POLE (sequencing only); EPCAM and  GREM1 (deletion/duplication only). DNA and RNA analyses performed for * genes.   Based on Vickie Collier's personal and family history of cancer, she meets medical criteria for genetic testing. Despite that she meets criteria, she may still have an out of pocket cost. We discussed that if her out of pocket cost for testing is over $100, the laboratory will call and confirm whether she wants to proceed with testing.  If the out of pocket cost of testing is less than $100 she will be billed by the genetic testing laboratory.   PLAN: After considering the risks, benefits, and limitations, Vickie Collier provided informed consent to pursue genetic testing and the blood sample was sent to [facility] for analysis of the CancerNext-Expanded+RNAinsight. Results should be available within approximately 2-3 weeks' time, at which point they will be disclosed by telephone to Vickie Collier, as will any additional recommendations warranted by these results. Vickie Collier will receive a summary of her genetic counseling visit and a copy of her results once available. This information will also be available in Epic.   Lastly, we encouraged Vickie Collier to remain in contact with cancer genetics annually so that we can continuously update the family history and inform her of any changes in cancer genetics and testing that may be of benefit for this family.   Vickie Collier questions were answered to her satisfaction today. Our contact information was provided should additional questions or concerns arise. Thank you for the referral and allowing [place] to share in the care of your patient.   Zamari Bonsall P. Florene Glen, [place], [employer] Licensed, Insurance risk surveyor [email or web address] phone: [phone]  The patient was seen for a total of 35 minutes in face-to-face genetic counseling.  The patient brought her daughter. This patient was discussed with Drs. Magrinat, Lindi Adie and/or Burr Medico who agrees with the above.     _______________________________________________________________________ For Office Staff:  Number of people involved in session: 2 Was an Intern/ student involved with case: yes [employer]

## 2021-03-23 ENCOUNTER — Ambulatory Visit
Admission: RE | Admit: 2021-03-23 | Discharge: 2021-03-23 | Disposition: A | Discharge status: Home / Self Care | Payer: Medicare PPO | Source: Ambulatory Visit | Attending: Radiation Oncology | Admitting: Radiation Oncology

## 2021-03-23 DIAGNOSIS — Z510 Encounter for antineoplastic radiation therapy: Secondary | ICD-10-CM | POA: Diagnosis not present

## 2021-03-24 ENCOUNTER — Ambulatory Visit: Payer: Medicare PPO

## 2021-03-27 ENCOUNTER — Ambulatory Visit
Admission: RE | Admit: 2021-03-27 | Discharge: 2021-03-27 | Disposition: A | Discharge status: Home / Self Care | Payer: Medicare PPO | Source: Ambulatory Visit | Attending: Radiation Oncology | Admitting: Radiation Oncology

## 2021-03-27 ENCOUNTER — Other Ambulatory Visit: Payer: Self-pay

## 2021-03-27 DIAGNOSIS — Z510 Encounter for antineoplastic radiation therapy: Secondary | ICD-10-CM | POA: Diagnosis not present

## 2021-03-28 ENCOUNTER — Ambulatory Visit
Admission: RE | Admit: 2021-03-28 | Discharge: 2021-03-28 | Disposition: A | Discharge status: Home / Self Care | Payer: Medicare PPO | Source: Ambulatory Visit | Attending: Radiation Oncology | Admitting: Radiation Oncology

## 2021-03-28 DIAGNOSIS — C50411 Malignant neoplasm of upper-outer quadrant of right female breast: Secondary | ICD-10-CM

## 2021-03-28 DIAGNOSIS — Z510 Encounter for antineoplastic radiation therapy: Secondary | ICD-10-CM | POA: Diagnosis not present

## 2021-03-28 MED ORDER — RADIAPLEXRX EX GEL: Freq: Once | CUTANEOUS | Status: AC

## 2021-03-28 NOTE — Progress Notes (Signed)
Pt here for patient teaching.  Pt given Radiation and You booklet, skin care instructions, and Radiaplex gel.  Reviewed areas of pertinence such as fatigue, hair loss, skin changes, breast tenderness, and breast swelling . Pt able to give teach back of to pat skin and use unscented/gentle soap,apply Radiaplex bid, avoid applying anything to skin within 4 hours of treatment, avoid wearing an under wire bra, and to use an electric razor if they must shave. Pt verbalizes understanding of information given and will contact nursing with any questions or concerns.   ? ? Gloriajean Dell. Quincy Simmonds RN, BSN  ? ? ? ? ? ?

## 2021-03-29 ENCOUNTER — Other Ambulatory Visit: Payer: Self-pay

## 2021-03-29 ENCOUNTER — Ambulatory Visit
Admission: RE | Admit: 2021-03-29 | Discharge: 2021-03-29 | Disposition: A | Discharge status: Home / Self Care | Payer: Medicare PPO | Source: Ambulatory Visit | Attending: Radiation Oncology | Admitting: Radiation Oncology

## 2021-03-29 DIAGNOSIS — Z510 Encounter for antineoplastic radiation therapy: Secondary | ICD-10-CM | POA: Diagnosis not present

## 2021-03-30 ENCOUNTER — Ambulatory Visit
Admission: RE | Admit: 2021-03-30 | Discharge: 2021-03-30 | Disposition: A | Discharge status: Home / Self Care | Payer: Medicare PPO | Source: Ambulatory Visit | Attending: Radiation Oncology | Admitting: Radiation Oncology

## 2021-03-30 DIAGNOSIS — Z510 Encounter for antineoplastic radiation therapy: Secondary | ICD-10-CM | POA: Diagnosis not present

## 2021-03-31 ENCOUNTER — Other Ambulatory Visit: Payer: Self-pay

## 2021-03-31 ENCOUNTER — Ambulatory Visit
Admission: RE | Admit: 2021-03-31 | Discharge: 2021-03-31 | Disposition: A | Discharge status: Home / Self Care | Payer: Medicare PPO | Source: Ambulatory Visit | Attending: Radiation Oncology | Admitting: Radiation Oncology

## 2021-03-31 DIAGNOSIS — Z510 Encounter for antineoplastic radiation therapy: Secondary | ICD-10-CM | POA: Diagnosis not present

## 2021-04-03 ENCOUNTER — Other Ambulatory Visit: Payer: Self-pay

## 2021-04-03 ENCOUNTER — Ambulatory Visit
Admission: RE | Admit: 2021-04-03 | Discharge: 2021-04-03 | Disposition: A | Discharge status: Home / Self Care | Payer: Medicare PPO | Source: Ambulatory Visit | Attending: Radiation Oncology | Admitting: Radiation Oncology

## 2021-04-03 ENCOUNTER — Encounter: Payer: Self-pay | Admitting: Genetic Counselor

## 2021-04-03 DIAGNOSIS — Z1379 Encounter for other screening for genetic and chromosomal anomalies: Secondary | ICD-10-CM | POA: Insufficient documentation

## 2021-04-03 DIAGNOSIS — Z510 Encounter for antineoplastic radiation therapy: Secondary | ICD-10-CM | POA: Diagnosis not present

## 2021-04-04 ENCOUNTER — Ambulatory Visit
Admission: RE | Admit: 2021-04-04 | Discharge: 2021-04-04 | Disposition: A | Discharge status: Home / Self Care | Payer: Medicare PPO | Source: Ambulatory Visit | Attending: Radiation Oncology | Admitting: Radiation Oncology

## 2021-04-04 ENCOUNTER — Telehealth: Payer: Self-pay | Admitting: Genetic Counselor

## 2021-04-04 DIAGNOSIS — Z510 Encounter for antineoplastic radiation therapy: Secondary | ICD-10-CM | POA: Diagnosis not present

## 2021-04-04 NOTE — Telephone Encounter (Signed)
LM on VM that results are back and to please call. 

## 2021-04-05 ENCOUNTER — Ambulatory Visit
Admission: RE | Admit: 2021-04-05 | Discharge: 2021-04-05 | Disposition: A | Discharge status: Home / Self Care | Payer: Medicare PPO | Source: Ambulatory Visit | Attending: Radiation Oncology | Admitting: Radiation Oncology

## 2021-04-05 ENCOUNTER — Other Ambulatory Visit: Payer: Self-pay

## 2021-04-05 DIAGNOSIS — Z510 Encounter for antineoplastic radiation therapy: Secondary | ICD-10-CM | POA: Diagnosis not present

## 2021-04-06 ENCOUNTER — Ambulatory Visit
Admission: RE | Admit: 2021-04-06 | Discharge: 2021-04-06 | Disposition: A | Discharge status: Home / Self Care | Payer: Medicare PPO | Source: Ambulatory Visit | Attending: Radiation Oncology | Admitting: Radiation Oncology

## 2021-04-06 DIAGNOSIS — Z510 Encounter for antineoplastic radiation therapy: Secondary | ICD-10-CM | POA: Diagnosis not present

## 2021-04-07 ENCOUNTER — Ambulatory Visit
Admission: RE | Admit: 2021-04-07 | Discharge: 2021-04-07 | Disposition: A | Discharge status: Home / Self Care | Payer: Medicare PPO | Source: Ambulatory Visit | Attending: Radiation Oncology | Admitting: Radiation Oncology

## 2021-04-07 ENCOUNTER — Ambulatory Visit: Payer: Self-pay | Admitting: Genetic Counselor

## 2021-04-07 ENCOUNTER — Ambulatory Visit: Payer: Medicare PPO

## 2021-04-07 ENCOUNTER — Other Ambulatory Visit: Payer: Self-pay

## 2021-04-07 DIAGNOSIS — Z1379 Encounter for other screening for genetic and chromosomal anomalies: Secondary | ICD-10-CM

## 2021-04-07 DIAGNOSIS — Z510 Encounter for antineoplastic radiation therapy: Secondary | ICD-10-CM | POA: Diagnosis not present

## 2021-04-07 NOTE — Telephone Encounter (Signed)
Revealed negative genetic testing.  Discussed that we do not know why she has breast cancer or why there is cancer in the family. It could be due to a different gene that we are not testing, or maybe our current technology may not be able to pick something up.  It will be important for her to keep in contact with genetics to keep up with whether additional testing may be needed. 

## 2021-04-07 NOTE — Progress Notes (Signed)
HPI:  Ms. Los was previously seen in the [facility] clinic due to a personal and family history of cancer and concerns regarding a hereditary predisposition to cancer. Please refer to our prior cancer genetics clinic note for more information regarding our discussion, assessment and recommendations, at the time. Ms. Casas recent genetic test results were disclosed to her, as were recommendations warranted by these results. These results and recommendations are discussed in more detail below. ? ?CANCER HISTORY:  ?Oncology History  ?Malignant neoplasm of upper-outer quadrant of right breast in female, estrogen receptor positive ([facility])  ?01/25/2021 Surgery  ? 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  ? ? ?FAMILY HISTORY:  ?We obtained a detailed, 4-generation family history.  Significant diagnoses are listed below: ?Family History  ?Problem Relation Age of Onset  ? Lupus Father   ? Ovarian cancer Maternal Grandmother 57  ? Prostate cancer Half-Brother 13  ? Pancreatic cancer Cousin   ?     paternal first cousin  ? Stomach cancer Cousin   ?     paternal first cousin  ? Lung cancer Cousin   ? Cancer Cousin   ?     NOS  ? ? ?  ?The patient has two sons and a daughter who are cancer free.  She has a paternal half brother and maternal half brother.  The maternal brother had prostate cancer at 36.  Both parents are deceased. ?  ?The patient's mother died in an accident at 78.  She had a brother and two sisters who are cancer free.  Her mother had ovarian cancer at 87 and her father died of unknown causes. ?  ?The patient's father died of lupus.  He had four sisters and a brother.  One sister had an unknown cancer, one sister had a daughter with pancreatic cancer and a third sister had three sons with cancer - stomach, lung and an unknown cancer.  The  grandparents died of heart disease. ?  ?Ms. Tuch is unaware of previous family history of genetic testing for hereditary cancer risks. Patient's maternal ancestors are of African American descent, and paternal ancestors are of African American descent. There is no reported Ashkenazi Jewish ancestry. There is no known consanguinity. ? ?GENETIC TEST RESULTS: Genetic testing reported out on April 03, 2021 through the CancerNext-Expanded+RNAinsight cancer panel found no pathogenic mutations. The CancerNext-Expanded gene panel offered by [facility] and includes sequencing and rearrangement analysis for the following 77 genes: AIP, ALK, APC*, ATM*, AXIN2, BAP1, BARD1, BLM, BMPR1A, BRCA1*, BRCA2*, BRIP1*, CDC73, CDH1*, CDK4, CDKN1B, CDKN2A, CHEK2*, CTNNA1, DICER1, FANCC, FH, FLCN, GALNT12, KIF1B, LZTR1, MAX, MEN1, MET, MLH1*, MSH2*, MSH3, MSH6*, MUTYH*, NBN, NF1*, NF2, NTHL1, PALB2*, PHOX2B, PMS2*, POT1, PRKAR1A, PTCH1, PTEN*, RAD51C*, RAD51D*, RB1, RECQL, RET, SDHA, SDHAF2, SDHB, SDHC, SDHD, SMAD4, SMARCA4, SMARCB1, SMARCE1, STK11, SUFU, TMEM127, TP53*, TSC1, TSC2, VHL and XRCC2 (sequencing and deletion/duplication); EGFR, EGLN1, HOXB13, KIT, MITF, PDGFRA, POLD1, and POLE (sequencing only); EPCAM and GREM1 (deletion/duplication only). DNA and RNA analyses performed for * genes. The test report has been scanned into EPIC and is located under the Molecular Pathology section of the Results Review tab.  A portion of the result report is included below for reference.  ? ? ? ? ?We discussed with Ms. Jensen that because current genetic testing is not perfect, it is possible there  may be a gene mutation in one of these genes that current testing cannot detect, but that chance is small.  We also discussed, that there could be another gene that has not yet been discovered, or that we have not yet tested, that is responsible for the cancer diagnoses in the family. It is also possible there is a hereditary cause for the cancer in  the family that Ms. Crawshaw did not inherit and therefore was not identified in her testing.  Therefore, it is important to remain in touch with cancer genetics in the future so that we can continue to offer Ms. Kaatz the most up to date genetic testing.  ? ?ADDITIONAL GENETIC TESTING: We discussed with Ms. Brecheen that her genetic testing was fairly extensive.  If there are genes identified to increase cancer risk that can be analyzed in the future, we would be happy to discuss and coordinate this testing at that time.   ? ?CANCER SCREENING RECOMMENDATIONS: Ms. Timko test result is considered negative (normal).  This means that we have not identified a hereditary cause for her personal and family history of cancer at this time. Most cancers happen by chance and this negative test suggests that her cancer may fall into this category.   ? ?While reassuring, this does not definitively rule out a hereditary predisposition to cancer. It is still possible that there could be genetic mutations that are undetectable by current technology. There could be genetic mutations in genes that have not been tested or identified to increase cancer risk.  Therefore, it is recommended she continue to follow the cancer management and screening guidelines provided by her oncology and primary healthcare provider.  ? ?An individual's cancer risk and medical management are not determined by genetic test results alone. Overall cancer risk assessment incorporates additional factors, including personal medical history, family history, and any available genetic information that may result in a personalized plan for cancer prevention and surveillance ? ?RECOMMENDATIONS FOR FAMILY MEMBERS:  Individuals in this family might be at some increased risk of developing cancer, over the general population risk, simply due to the family history of cancer.  We recommended women in this family have a yearly mammogram beginning at age 31, or 4 years younger  than the earliest onset of cancer, an annual clinical breast exam, and perform monthly breast self-exams. Women in this family should also have a gynecological exam as recommended by their primary provider. All family members should be referred for colonoscopy starting at age 5. ? ?FOLLOW-UP: Lastly, we discussed with Ms. Dziuba that cancer genetics is a rapidly advancing field and it is possible that new genetic tests will be appropriate for her and/or her family members in the future. We encouraged her to remain in contact with cancer genetics on an annual basis so we can update her personal and family histories and let her know of advances in cancer genetics that may benefit this family.  ? ?Our contact number was provided. Ms. Blas questions were answered to her satisfaction, and she knows she is welcome to call us at anytime with additional questions or concerns.  ? ?Roma Kayser, MS, [place] ?Licensed, Insurance risk surveyor ?[email or web address] ? ?

## 2021-04-10 ENCOUNTER — Ambulatory Visit: Payer: Medicare PPO

## 2021-04-10 ENCOUNTER — Ambulatory Visit
Admission: RE | Admit: 2021-04-10 | Discharge: 2021-04-10 | Disposition: A | Discharge status: Home / Self Care | Payer: Medicare PPO | Source: Ambulatory Visit | Attending: Radiation Oncology | Admitting: Radiation Oncology

## 2021-04-10 ENCOUNTER — Other Ambulatory Visit: Payer: Self-pay

## 2021-04-10 ENCOUNTER — Encounter: Payer: Self-pay | Admitting: Radiation Oncology

## 2021-04-10 DIAGNOSIS — Z510 Encounter for antineoplastic radiation therapy: Secondary | ICD-10-CM | POA: Diagnosis not present

## 2021-04-11 ENCOUNTER — Ambulatory Visit
Admission: RE | Admit: 2021-04-11 | Discharge: 2021-04-11 | Disposition: A | Discharge status: Home / Self Care | Payer: Medicare PPO | Source: Ambulatory Visit | Attending: Radiation Oncology | Admitting: Radiation Oncology

## 2021-04-11 ENCOUNTER — Ambulatory Visit: Payer: Medicare PPO

## 2021-04-11 DIAGNOSIS — Z510 Encounter for antineoplastic radiation therapy: Secondary | ICD-10-CM | POA: Diagnosis not present

## 2021-04-12 ENCOUNTER — Ambulatory Visit
Admission: RE | Admit: 2021-04-12 | Discharge: 2021-04-12 | Disposition: A | Discharge status: Home / Self Care | Payer: Medicare PPO | Source: Ambulatory Visit | Attending: Radiation Oncology | Admitting: Radiation Oncology

## 2021-04-12 ENCOUNTER — Ambulatory Visit: Payer: Medicare PPO

## 2021-04-12 ENCOUNTER — Other Ambulatory Visit: Payer: Self-pay

## 2021-04-12 DIAGNOSIS — Z510 Encounter for antineoplastic radiation therapy: Secondary | ICD-10-CM | POA: Diagnosis not present

## 2021-04-13 ENCOUNTER — Ambulatory Visit
Admission: RE | Admit: 2021-04-13 | Discharge: 2021-04-13 | Disposition: A | Discharge status: Home / Self Care | Payer: Medicare PPO | Source: Ambulatory Visit | Attending: Radiation Oncology | Admitting: Radiation Oncology

## 2021-04-13 ENCOUNTER — Ambulatory Visit: Payer: Medicare PPO

## 2021-04-13 DIAGNOSIS — Z510 Encounter for antineoplastic radiation therapy: Secondary | ICD-10-CM | POA: Diagnosis not present

## 2021-04-14 ENCOUNTER — Ambulatory Visit
Admission: RE | Admit: 2021-04-14 | Discharge: 2021-04-14 | Disposition: A | Discharge status: Home / Self Care | Payer: Medicare PPO | Source: Ambulatory Visit | Attending: Radiation Oncology | Admitting: Radiation Oncology

## 2021-04-14 ENCOUNTER — Ambulatory Visit: Payer: Medicare PPO

## 2021-04-14 ENCOUNTER — Other Ambulatory Visit: Payer: Self-pay

## 2021-04-14 DIAGNOSIS — Z510 Encounter for antineoplastic radiation therapy: Secondary | ICD-10-CM | POA: Diagnosis not present

## 2021-04-17 ENCOUNTER — Ambulatory Visit
Admission: RE | Admit: 2021-04-17 | Discharge: 2021-04-17 | Disposition: A | Discharge status: Home / Self Care | Payer: Medicare PPO | Source: Ambulatory Visit | Attending: Radiation Oncology | Admitting: Radiation Oncology

## 2021-04-17 ENCOUNTER — Ambulatory Visit: Payer: Medicare PPO

## 2021-04-17 ENCOUNTER — Other Ambulatory Visit: Payer: Self-pay

## 2021-04-17 DIAGNOSIS — C50411 Malignant neoplasm of upper-outer quadrant of right female breast: Secondary | ICD-10-CM | POA: Diagnosis present

## 2021-04-17 DIAGNOSIS — Z510 Encounter for antineoplastic radiation therapy: Secondary | ICD-10-CM | POA: Diagnosis present

## 2021-04-17 DIAGNOSIS — Z170 Estrogen receptor positive status [ER+]: Secondary | ICD-10-CM | POA: Insufficient documentation

## 2021-04-18 ENCOUNTER — Encounter: Payer: Self-pay | Admitting: *Deleted

## 2021-04-18 ENCOUNTER — Ambulatory Visit
Admission: RE | Admit: 2021-04-18 | Discharge: 2021-04-18 | Disposition: A | Discharge status: Home / Self Care | Payer: Medicare PPO | Source: Ambulatory Visit | Attending: Radiation Oncology | Admitting: Radiation Oncology

## 2021-04-18 ENCOUNTER — Ambulatory Visit: Payer: Medicare PPO

## 2021-04-18 DIAGNOSIS — Z510 Encounter for antineoplastic radiation therapy: Secondary | ICD-10-CM | POA: Diagnosis not present

## 2021-04-19 ENCOUNTER — Ambulatory Visit
Admission: RE | Admit: 2021-04-19 | Discharge: 2021-04-19 | Disposition: A | Discharge status: Home / Self Care | Payer: Medicare PPO | Source: Ambulatory Visit | Attending: Radiation Oncology | Admitting: Radiation Oncology

## 2021-04-19 ENCOUNTER — Encounter: Payer: Self-pay | Admitting: Radiation Oncology

## 2021-04-19 ENCOUNTER — Other Ambulatory Visit: Payer: Self-pay

## 2021-04-19 DIAGNOSIS — Z510 Encounter for antineoplastic radiation therapy: Secondary | ICD-10-CM | POA: Diagnosis not present

## 2021-04-21 NOTE — Progress Notes (Signed)
?  Radiation Oncology         [phone] ?________________________________ ? ?Name: Vickie Collier MRN: [id number]  ?Date: 04/10/2021  DOB: 1950/07/17 ? ?SIMULATION NOTE ? ? ?NARRATIVE:  The patient underwent simulation today for ongoing radiation therapy.  The existing CT study set was employed for the purpose of virtual treatment planning.  The target and avoidance structures were reviewed and modified as necessary.  Treatment planning then occurred.  The radiation boost prescription was entered and confirmed.  A total of 3 complex treatment devices were fabricated in the form of multi-leaf collimators to shape radiation around the targets while maximally excluding nearby normal structures. I have requested : Isodose Plan.  ? ? ?PLAN:  This modified radiation beam arrangement is intended to continue the current radiation dose to an additional 10 Gy in 4 fractions for a total cumulative dose of 52.56 Gy. ? ? ? ?------------------------------------------------ ? ?Jodelle Gross, MD, PhD ? ?  ? ?

## 2021-05-02 NOTE — Progress Notes (Signed)
? ?                                                                                                                                                          ?  Patient Name: Vickie Collier ?MRN: [id number] ?DOB: 1950/10/09 ?Referring Physician: Nicholas Lose (Profile Not Attached) ?Date of Service: 04/19/2021 ?[facility], [place] ? ?                                                      End Of Treatment Note ? ?Diagnoses: C50.411-Malignant neoplasm of upper-outer quadrant of right female breast ? ?Cancer Staging: Stage IA, pT1a, cN0M0, grade 1 invasive ductal carcinoma of the right breast. ? ?Intent: Curative ? ?Radiation Treatment Dates: 03/22/2021 through 04/19/2021 ?Site Technique Total Dose (Gy) Dose per Fx (Gy) Completed Fx Beam Energies  ?Breast, Right: Breast_R 3D 42.56/42.56 2.66 16/16 10X  ?Breast, Right: Breast_R_Bst 3D 10/10 2.5 4/4 10X  ? ?Narrative: The patient tolerated radiation therapy relatively well. She developed fatigue and anticipated skin changes in the treatment field.  ? ?Plan: The patient will receive a call in about one month from the radiation oncology department. She will continue follow up with Dr. Lindi Adie as well.  ? ?________________________________________________ ? ? ? ?Carola Rhine, PAC  ?

## 2021-05-11 ENCOUNTER — Telehealth: Payer: Self-pay | Admitting: *Deleted

## 2021-05-16 ENCOUNTER — Telehealth: Payer: Self-pay | Admitting: Adult Health

## 2021-05-16 NOTE — Telephone Encounter (Signed)
Changes visit type to be virtual instead of rescheduling, since patient is mychart active. left message with patient.  ?

## 2021-05-17 ENCOUNTER — Inpatient Hospital Stay: Payer: Medicare PPO | Attending: Hematology and Oncology | Admitting: Adult Health

## 2021-05-17 DIAGNOSIS — Z170 Estrogen receptor positive status [ER+]: Secondary | ICD-10-CM

## 2021-05-17 DIAGNOSIS — E2839 Other primary ovarian failure: Secondary | ICD-10-CM | POA: Diagnosis not present

## 2021-05-17 DIAGNOSIS — C50411 Malignant neoplasm of upper-outer quadrant of right female breast: Secondary | ICD-10-CM

## 2021-05-17 NOTE — Progress Notes (Signed)
SURVIVORSHIP VIRTUAL VISIT: ? ?I connected with Mady Haagensen on 05/19/21 at 10:15 AM EDT by my chart video and verified that I am speaking with the correct person using two identifiers.  ?I discussed the limitations, risks, security and privacy concerns of performing an evaluation and management service by telephone and the availability of in person appointments. I also discussed with the patient that there may be a patient responsible charge related to this service. The patient expressed understanding and agreed to proceed.  ? ?Patient location: home ?Provider location, home in [place], [place] ? ?BRIEF ONCOLOGIC HISTORY:  ?Oncology History  ?Malignant neoplasm of upper-outer quadrant of right breast in female, estrogen receptor positive ([place])  ?01/25/2021 Surgery  ? 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  ?03/22/2021 - 04/19/2021 Radiation Therapy  ? Site Technique Total Dose (Gy) Dose per Fx (Gy) Completed Fx Beam Energies  ?Breast, Right: Breast_R 3D 42.56/42.56 2.66 16/16 10X  ?Breast, Right: Breast_R_Bst 3D 10/10 2.5 4/4 10X  ? ?  ?04/03/2021 Genetic Testing  ? Genetic testing reported through the CancerNext-Expanded+RNAinsight cancer panel found no pathogenic mutations.  ? ?The CancerNext-Expanded gene panel offered by [facility] and includes sequencing and rearrangement analysis for the following 77 genes: AIP, ALK, APC*, ATM*, AXIN2, BAP1, BARD1, BLM, BMPR1A, BRCA1*, BRCA2*, BRIP1*, CDC73, CDH1*, CDK4, CDKN1B, CDKN2A, CHEK2*, CTNNA1, DICER1, FANCC, FH, FLCN, GALNT12, KIF1B, LZTR1, MAX, MEN1, MET, MLH1*, MSH2*, MSH3, MSH6*, MUTYH*, NBN, NF1*, NF2, NTHL1, PALB2*, PHOX2B, PMS2*, POT1, PRKAR1A, PTCH1, PTEN*, RAD51C*, RAD51D*, RB1, RECQL, RET, SDHA, SDHAF2, SDHB, SDHC, SDHD, SMAD4, SMARCA4, SMARCB1, SMARCE1, STK11, SUFU, TMEM127, TP53*, TSC1, TSC2, VHL and XRCC2  (sequencing and deletion/duplication); EGFR, EGLN1, HOXB13, KIT, MITF, PDGFRA, POLD1, and POLE (sequencing only); EPCAM and GREM1 (deletion/duplication only). DNA and RNA analyses performed for * genes.  ?  ? ? ?INTERVAL HISTORY:  ?Ms. Poppen to review her survivorship care plan detailing her treatment course for breast cancer, as well as monitoring long-term side effects of that treatment, education regarding health maintenance, screening, and overall wellness and health promotion.    ? ?Overall, Ms. Herard reports feeling quite well.  She tells me she has some residual fatigue from radiation, however her breast is continuing to heal.  She does have some occasional breast pain and swelling from radiation.   ? ?She is taking letrozole daily and is tolerating it well.  She denies any significant issues from the medications.  She has some arthralgias, however those were present prior to her diagnosis.   ?REVIEW OF SYSTEMS:  ?Review of Systems  ?Constitutional:  Positive for fatigue. Negative for appetite change, chills, fever and unexpected weight change.  ?HENT:   Negative for hearing loss, lump/mass and trouble swallowing.   ?Eyes:  Negative for eye problems and icterus.  ?Respiratory:  Negative for chest tightness, cough and shortness of breath.   ?Cardiovascular:  Negative for chest pain, leg swelling and palpitations.  ?Gastrointestinal:  Negative for abdominal distention, abdominal pain, constipation, diarrhea, nausea and vomiting.  ?Endocrine: Negative for hot flashes.  ?Genitourinary:  Negative for difficulty urinating.   ?Musculoskeletal:  Negative for arthralgias.  ?Skin:  Negative for itching and rash.  ?Neurological:  Negative for dizziness, extremity weakness, headaches and numbness.  ?Hematological:  Negative for adenopathy. Does not bruise/bleed easily.  ?Psychiatric/Behavioral:  Negative for depression. The patient is not nervous/anxious.   ?Breast: Denies  any new nodularity, masses, tenderness, nipple  changes, or nipple discharge.  ? ? ? ? ?ONCOLOGY TREATMENT TEAM:  ?1. Surgeon:  Dr. Barry Dienes at [facility] ?2. Medical Oncologist: Dr. Lindi Adie  ?3. Radiation Oncologist: Dr. Lisbeth Renshaw ?  ? ?PAST MEDICAL/SURGICAL HISTORY:  ?Past Medical History:  ?Diagnosis Date  ? Arthritis   ? Atypical ductal hyperplasia of right breast   ? Breast cancer ([place]) 01/25/2021  ? Family history of ovarian cancer   ? Family history of pancreatic cancer   ? Family history of prostate cancer   ? Family history of stomach cancer   ? GERD (gastroesophageal reflux disease)   ? Hyperlipidemia   ? Hypertension   ? ?Past Surgical History:  ?Procedure Laterality Date  ? ABDOMINAL HYSTERECTOMY    ? JOINT REPLACEMENT Bilateral   ? TKR  ? RADIOACTIVE SEED GUIDED EXCISIONAL BREAST BIOPSY Right 01/25/2021  ? Procedure: RADIOACTIVE SEED GUIDED EXCISIONAL RIGHT BREAST BIOPSY;  Surgeon: Stark Klein, MD;  Location: [place];  Service: General;  Laterality: Right;  ? RE-EXCISION OF BREAST LUMPECTOMY Right 02/14/2021  ? Procedure: RE-EXCISION OF RIGHT BREAST LUMPECTOMY;  Surgeon: Stark Klein, MD;  Location: [facility];  Service: General;  Laterality: Right;  ? ? ? ?ALLERGIES:  ?Allergies  ?Allergen Reactions  ? Lisinopril Cough  ? ? ? ?CURRENT MEDICATIONS:  ?Outpatient Encounter Medications as of 05/17/2021  ?Medication Sig  ? amLODipine (NORVASC) 10 MG tablet Take 10 mg by mouth daily.  ? aspirin EC 81 MG tablet Take 81 mg by mouth daily. Swallow whole.  ? baclofen (LIORESAL) 10 MG tablet Take 10 mg by mouth 2 (two) times daily.  ? cholecalciferol (VITAMIN D3) 25 MCG (1000 UNIT) tablet Take 1,000 Units by mouth daily.  ? cyanocobalamin 100 MCG tablet Take 100 mcg by mouth daily.  ? felodipine (PLENDIL) 5 MG 24 hr tablet Take 5 mg by mouth daily.  ? letrozole (FEMARA) 2.5 MG tablet Take 1 tablet (2.5 mg total) by mouth daily.  ? metoprolol succinate (TOPROL-XL) 50 MG 24 hr tablet Take 50 mg by mouth daily. Take with or  immediately following a meal.  ? oxyCODONE (OXY IR/ROXICODONE) 5 MG immediate release tablet Take 1 tablet (5 mg total) by mouth every 6 (six) hours as needed for severe pain.  ? pregabalin (LYRICA) 150 MG capsule Take 150 mg by mouth 2 (two) times daily.  ? rosuvastatin (CRESTOR) 5 MG tablet Take 5 mg by mouth daily.  ? valsartan-hydrochlorothiazide (DIOVAN-HCT) 80-12.5 MG tablet Take 1 tablet by mouth daily.  ? ?No facility-administered encounter medications on file as of 05/17/2021.  ? ? ? ?ONCOLOGIC FAMILY HISTORY:  ?Family History  ?Problem Relation Age of Onset  ? Lupus Father   ? Ovarian cancer Maternal Grandmother 78  ? Prostate cancer Half-Brother 45  ? Pancreatic cancer Cousin   ?     paternal first cousin  ? Stomach cancer Cousin   ?     paternal first cousin  ? Lung cancer Cousin   ? Cancer Cousin   ?     NOS  ? ? ? ? ?SOCIAL HISTORY:  ?Social History  ? ?Socioeconomic History  ? Marital status: Single  ?  Spouse name: Not on file  ? Number of children: Not on file  ? Years of education: Not on file  ? Highest education level: Not on file  ?Occupational History  ? Not on file  ?Tobacco Use  ? Smoking status: Never  ?  Smokeless tobacco: Never  ?Substance and Sexual Activity  ? Alcohol use: Not Currently  ? Drug use: Never  ? Sexual activity: Not Currently  ?  Birth control/protection: Surgical  ?Other Topics Concern  ? Not on file  ?Social History Narrative  ? Not on file  ? ?Social Determinants of Health  ? ?Financial Resource Strain: Not on file  ?Food Insecurity: Not on file  ?Transportation Needs: Not on file  ?Physical Activity: Not on file  ?Stress: Not on file  ?Social Connections: Not on file  ?Intimate Partner Violence: Unknown  ? Fear of Current or Ex-Partner: No  ? Emotionally Abused: No  ? Physically Abused: Not on file  ? Sexually Abused: No  ? ? ? ?OBSERVATIONS/OBJECTIVE:  ?Patient appears well.  In no apparent distress.  Mood and behavior are normal.  Breathing non labored.  Skin visualized  without rash or lesion.  ? ?LABORATORY DATA:  ?None for this visit. ? ?DIAGNOSTIC IMAGING:  ?None for this visit.  ? ?  ? ?ASSESSMENT AND PLAN:  ?Ms.Ninfa Giannelli is a pleasant 71 y.o. female with Stage Ia right/ breas

## 2021-05-19 ENCOUNTER — Encounter: Payer: Self-pay | Admitting: Adult Health

## 2021-05-26 ENCOUNTER — Ambulatory Visit: Payer: Medicare PPO | Attending: Adult Health

## 2021-05-26 DIAGNOSIS — R293 Abnormal posture: Secondary | ICD-10-CM | POA: Diagnosis present

## 2021-05-26 DIAGNOSIS — R600 Localized edema: Secondary | ICD-10-CM | POA: Insufficient documentation

## 2021-05-26 DIAGNOSIS — C50411 Malignant neoplasm of upper-outer quadrant of right female breast: Secondary | ICD-10-CM | POA: Diagnosis present

## 2021-05-26 DIAGNOSIS — Z170 Estrogen receptor positive status [ER+]: Secondary | ICD-10-CM | POA: Diagnosis present

## 2021-05-26 NOTE — Therapy (Signed)
?OUTPATIENT PHYSICAL THERAPY ONCOLOGY EVALUATION ? ?Patient Name: Vickie Collier ?MRN: [id number] ?DOB:September 22, 1950, 71 y.o., female ?Today's Date: 05/26/2021 ? ? PT End of Session - 05/26/21 0856   ? ? Visit Number 1   ? Number of Visits 12   ? Date for PT Re-Evaluation 07/07/21   ? PT Start Time [phone]   ? PT Stop Time 9509   ? PT Time Calculation (min) 49 min   ? Activity Tolerance Patient tolerated treatment well   ? Behavior During Therapy [facility] for tasks assessed/performed   ? ?  ?  ? ?  ? ? ?Past Medical History:  ?Diagnosis Date  ? Arthritis   ? Atypical ductal hyperplasia of right breast   ? Breast cancer ([place]) 01/25/2021  ? Family history of ovarian cancer   ? Family history of pancreatic cancer   ? Family history of prostate cancer   ? Family history of stomach cancer   ? GERD (gastroesophageal reflux disease)   ? Hyperlipidemia   ? Hypertension   ? ?Past Surgical History:  ?Procedure Laterality Date  ? ABDOMINAL HYSTERECTOMY    ? JOINT REPLACEMENT Bilateral   ? TKR  ? RADIOACTIVE SEED GUIDED EXCISIONAL BREAST BIOPSY Right 01/25/2021  ? Procedure: RADIOACTIVE SEED GUIDED EXCISIONAL RIGHT BREAST BIOPSY;  Surgeon: Stark Klein, MD;  Location: [place];  Service: General;  Laterality: Right;  ? RE-EXCISION OF BREAST LUMPECTOMY Right 02/14/2021  ? Procedure: RE-EXCISION OF RIGHT BREAST LUMPECTOMY;  Surgeon: Stark Klein, MD;  Location: [place];  Service: General;  Laterality: Right;  ? ?Patient Active Problem List  ? Diagnosis Date Noted  ? Genetic testing 04/03/2021  ? Family history of prostate cancer 03/22/2021  ? Family history of ovarian cancer 03/22/2021  ? Family history of pancreatic cancer 03/22/2021  ? Family history of stomach cancer 03/22/2021  ? Malignant neoplasm of upper-outer quadrant of right breast in female, estrogen receptor positive ([place]) 02/16/2021  ? ? ?PCP: Beatrix Shipper, MD ? ?REFERRING PROVIDER: Wilber Bihari, NP ? ?REFERRING DIAG: Right Breast  Cancer ? ?THERAPY DIAG:  ?Malignant neoplasm of upper-outer quadrant of right breast in female, estrogen receptor positive ([place]) ? ?Abnormal posture ? ?Localized edema ? ?ONSET DATE: 10/06/2021 ? ?SUBJECTIVE                                                                                                                                                                                          ? ?SUBJECTIVE STATEMENT: ?Pt  skin problems with radiation under the breast and it is better  but  still not fully healed. Having difficulty using her right arm  mainly because of arthritis. Was having some swelling in the breast and still has it but not as bad. She is having pain under the breast where skin is still broken ? ? ?PERTINENT HISTORY: Pt had an abnormal right mammogram and pt underwent seed localized excisional biopsy 01/25/21. The pathology was positive for cancer and margins were not clear so she underwent a re-excision on 02/14/2021. She had radiation which ended on 04/19/2021. 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 No LN's were removed. It was ER=, PR +, and HER 2 -. Pt ambulates with a rollator with left leg outside rollator. She walks with stiff knees from prior TKA's, She does not flex well at the hips and has difficulty sitting erect. She requires assist to get up and down but family will help. She thinks she can get on the table with head of table elevated ? ?PAIN:  ?Are you having pain? Yes ?NPRS scale: 6/10 ?Pain location: Right shoulder and under the arm ?Pain orientation: Right  ?PAIN TYPE: aching and sharp ?Pain description: intermittent  ?Aggravating factors: reaching with right UE ?Relieving factors: medications, topicals ? 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 ?WEIGHT BEARING RESTRICTIONS : no, but uses a rollator and requires help with sit to stand. Knees don't bend well, nor do her  hips ? ?FALLS:  ?Has patient fallen in last 6 months? No ? ?LIVING ENVIRONMENT: ?Lives with: lives with her granddaughter, just moved from [place] to be closer to her family. ?Lives in: House/apartment ?Stairs: Yes; Internal: 13 steps; on right going up ?Has following equipment at home: Gilford Rile - 4 wheeled, sleeps in lift chair ? ?OCCUPATION: retired ? ?LEISURE: be with family ? ?HAND DOMINANCE : right  ? ?PRIOR LEVEL OF FUNCTION: Independent with household mobility with device, needs help with socks,uses lift chair to get up, help to bath legs ? ?PATIENT GOALS Decrease pain and swelling, improve ROM ? ? ?OBJECTIVE ? ?COGNITION: ? Overall cognitive status: Within functional limits for tasks assessed  ? ?PALPATION: Tender right lateral breast ? ?OBSERVATIONS / OTHER ASSESSMENTS: macerated skin under right breast being treated with Neosporin, Dry desquamation medial breast, Swelling noted at lateral breast  with large healed incision lateral breast ? ?SENSATION: ? Light touch: Appears intact ? ? POSTURE: forward head, rounded shoulders ? ?UPPER EXTREMITY AROM/PROM: ? ?A/PROM RIGHT  05/26/2021 ?  ?Shoulder extension   ?Shoulder flexion 95  ?Shoulder abduction 88  ?Shoulder internal rotation   ?Shoulder external rotation   ?  (Blank rows = not tested) ? ?A/PROM LEFT  05/26/2021  ?Shoulder extension   ?Shoulder flexion 82  ?Shoulder abduction 30  ?Shoulder internal rotation   ?Shoulder external rotation   ?  (Blank rows = not tested) ? ? ?CERVICAL AROM: ?All within normal limits:  ? ? ? ? ?UPPER EXTREMITY STRENGTH: WFL ? ? ? ? ? (Blank rows = not tested) ? ? ? ?LYMPHEDEMA ASSESSMENTS:  ? ?SURGERY TYPE/DATE: 01/25/2021 excisional biopsy, followed by reexcision on 02/14/2021 ? ?NUMBER OF LYMPH NODES REMOVED: 0 ? ?CHEMOTHERAPY: no ? ?RADIATION:yes, final rx 04/19/2021 ? ?HORMONE TREATMENT: yes ? ?INFECTIONS: no ? ?LYMPHEDEMA ASSESSMENTS: circumference of chest; ? ?[place] RIGHT  05/26/2021  ?10 cm proximal to olecranon process  38.7  ?Olecranon process 26.8  ?10 cm proximal to ulnar styloid process 19.3  ?Just proximal to ulnar styloid process 15.0  ?Across hand at thumb web space  18.1  ?At base of 2nd digit 5.8  ?Chest  109.3  ?(Blank rows = not tested) ? ? ? ?[place] LEFT  05/26/2021  ?10 cm proximal to olecranon process 37.9  ?Olecranon process 27.5  ?10 cm proximal to ulnar styloid process 18.9  ?Just proximal to ulnar styloid process 15.7  ?Across hand at thumb web space 18.3  ?At base of 2nd digit 5.8  ?(Blank rows = not tested) ? ? ? ? ?QUICK DASH SURVEY: 73% ?Breast complaints scale: 27/8 questions ? ? ?TODAY'S TREATMENT  ?Educated pt and her son in anatomy of lymphatics, and theory of MLD. Discussed POC, including flexi touch and pt would like her demographics sent. Discussed compression bra and gave script but will have to wait until underneath breast is fully healed ? ?PATIENT EDUCATION:  ?Education details: discussed POC, reviewed lymphatic anatomy and light touch,discussed compression bra when her skin is healed and gave information and script, Discussed Flexi touch if needed and sent demographics.to them with her permission. Pt was given telfa pads to try under her breast if helpful .Person educated: Patient and son ?Education method: Explanation and Handouts ?Education comprehension: verbalized understanding ? ? ?HOME EXERCISE PROGRAM: ?None given ? ?ASSESSMENT: ? ?CLINICAL IMPRESSION: ?Patient is a 71 y.o. female who was seen today for physical therapy evaluation and treatment for Right breast swelling. She was attended by her son except when breast was being visualized. She has very poor mobility and requires assistance to get up and down. She is using a rollator. Her knees dont bend well from prior TKA's, and she has advanced bilateral shoulder arthritis. She will benefit from skilled PT to address shoulder limitations as needed although pt thinks her ROM is at norm for her, and to address right breast swelling.    ? ? ?OBJECTIVE IMPAIRMENTS decreased ROM, decreased strength, increased edema, impaired UE functional use, postural dysfunction, obesity, and pain.  ? ?ACTIVITY LIMITATIONS cleaning, meal prep, and shopping.  ? ?PERSONAL FAC

## 2021-05-29 ENCOUNTER — Ambulatory Visit
Admission: RE | Admit: 2021-05-29 | Discharge: 2021-05-29 | Disposition: A | Discharge status: Home / Self Care | Payer: Medicare PPO | Source: Ambulatory Visit | Attending: Radiation Oncology | Admitting: Radiation Oncology

## 2021-05-29 DIAGNOSIS — Z170 Estrogen receptor positive status [ER+]: Secondary | ICD-10-CM

## 2021-05-29 NOTE — Progress Notes (Signed)
?  Radiation Oncology         (336) [phone] ?________________________________ ? ?Name: Vickie Collier MRN: [id number]  ?Date of Service: 05/29/2021  DOB: 11/04/1950 ? ?Post Treatment Telephone Note ? ?Diagnosis:   Stage IA, pT1a, cN0M0, grade 1 invasive ductal carcinoma of the right breast. ? ?Intent: Curative ? ?Radiation Treatment Dates: 03/22/2021 through 04/19/2021 ?Site Technique Total Dose (Gy) Dose per Fx (Gy) Completed Fx Beam Energies  ?Breast, Right: Breast_R 3D 42.56/42.56 2.66 16/16 10X  ?Breast, Right: Breast_R_Bst 3D 10/10 2.5 4/4 10X  ? ?Narrative: The patient tolerated radiation therapy relatively well. She developed fatigue and anticipated skin changes in the treatment field.  ? ? ? ? ?Impression/Plan: ?1. 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  I was unable to reach the patient but left a voicemail and on the message, I discussed that we would be happy to continue to follow her as needed, but she will also continue to follow up with Dr. Lindi Adie in medical oncology. She was counseled to call if she had questions about skin care or measures to avoid sun exposure to this area.   ? ? ? ? ?Carola Rhine, PAC  ? ? ? ? ?

## 2021-06-07 ENCOUNTER — Ambulatory Visit: Payer: Medicare PPO | Admitting: Rehabilitation

## 2021-06-15 ENCOUNTER — Ambulatory Visit: Payer: Medicare PPO | Attending: Adult Health

## 2021-06-15 DIAGNOSIS — C50411 Malignant neoplasm of upper-outer quadrant of right female breast: Secondary | ICD-10-CM | POA: Insufficient documentation

## 2021-06-15 DIAGNOSIS — R600 Localized edema: Secondary | ICD-10-CM | POA: Insufficient documentation

## 2021-06-15 DIAGNOSIS — Z170 Estrogen receptor positive status [ER+]: Secondary | ICD-10-CM | POA: Diagnosis present

## 2021-06-15 DIAGNOSIS — R293 Abnormal posture: Secondary | ICD-10-CM | POA: Insufficient documentation

## 2021-06-15 NOTE — Therapy (Signed)
OUTPATIENT PHYSICAL THERAPY ONCOLOGY TREATMENT  Patient Name: Vickie Collier MRN: [id number] DOB:May 28, 1950, 71 y.o., female Today's Date: 06/15/2021   PT End of Session - 06/15/21 1601     Visit Number 2    Number of Visits 12    Date for PT Re-Evaluation 07/07/21    PT Start Time 1602    PT Stop Time 1647    PT Time Calculation (min) 45 min    Activity Tolerance Patient tolerated treatment well    Behavior During Therapy [facility] for tasks assessed/performed             Past Medical History:  Diagnosis Date   Arthritis    Atypical ductal hyperplasia of right breast    Breast cancer ([place]) 01/25/2021   Family history of ovarian cancer    Family history of pancreatic cancer    Family history of prostate cancer    Family history of stomach cancer    GERD (gastroesophageal reflux disease)    Hyperlipidemia    Hypertension    Past Surgical History:  Procedure Laterality Date   ABDOMINAL HYSTERECTOMY     JOINT REPLACEMENT Bilateral    TKR   RADIOACTIVE SEED GUIDED EXCISIONAL BREAST BIOPSY Right 01/25/2021   Procedure: RADIOACTIVE SEED GUIDED EXCISIONAL RIGHT BREAST BIOPSY;  Surgeon: Stark Klein, MD;  Location: [place];  Service: General;  Laterality: Right;   RE-EXCISION OF BREAST LUMPECTOMY Right 02/14/2021   Procedure: RE-EXCISION OF RIGHT BREAST LUMPECTOMY;  Surgeon: Stark Klein, MD;  Location: [place];  Service: General;  Laterality: Right;   Patient Active Problem List   Diagnosis Date Noted   Genetic testing 04/03/2021   Family history of prostate cancer 03/22/2021   Family history of ovarian cancer 03/22/2021   Family history of pancreatic cancer 03/22/2021   Family history of stomach cancer 03/22/2021   Malignant neoplasm of upper-outer quadrant of right breast in female, estrogen receptor positive ([place]) 02/16/2021    PCP: Beatrix Shipper, MD  REFERRING PROVIDER: Wilber Bihari, NP  REFERRING DIAG: Right Breast  Cancer  THERAPY DIAG:  Malignant neoplasm of upper-outer quadrant of right breast in female, estrogen receptor positive ([place])  Abnormal posture  Localized edema  ONSET DATE: 10/06/2021  SUBJECTIVE                                                                                                                                                                                           SUBJECTIVE STATEMENT:  Under the breast is still not all the way healed, but I think the swelling has gone down some. The breast heaviness isn't as bad.  PERTINENT HISTORY: Pt had an abnormal right mammogram and pt underwent seed localized excisional biopsy 01/25/21. The pathology was positive for cancer and margins were not clear so she underwent a re-excision on 02/14/2021. She had radiation which ended on 04/19/2021. 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 No LN's were removed. It was ER=, PR +, and HER 2 -. Pt ambulates with a rollator with left leg outside rollator. She walks with stiff knees from prior TKA's, She does not flex well at the hips and has difficulty sitting erect. She requires assist to get up and down but family will help. She thinks she can get on the table with head of table elevated  PAIN:  Are you having pain? 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 Knees don't bend well, nor do her hips  FALLS:  Has patient fallen in last 6 months? No  LIVING ENVIRONMENT: Lives with: lives with her granddaughter, just moved from [place] to be closer to her  family. Lives in: House/apartment Stairs: Yes; Internal: 13 steps; on right going up Has following equipment at home: Walker - 4 wheeled, sleeps in lift chair  OCCUPATION: retired  LEISURE: be with family  HAND DOMINANCE : right   PRIOR LEVEL OF FUNCTION: Independent with household mobility with device, needs help with socks,uses lift chair to get up, help to bath legs  PATIENT GOALS Decrease pain and swelling, improve ROM   OBJECTIVE  COGNITION:  Overall cognitive status: Within functional limits for tasks assessed   PALPATION: Tender right lateral breast  OBSERVATIONS / OTHER ASSESSMENTS: macerated skin under right breast being treated with Neosporin, Dry desquamation medial breast, Swelling noted at lateral breast  with large healed incision lateral breast  SENSATION:  Light touch: Appears intact   POSTURE: forward head, rounded shoulders  UPPER EXTREMITY AROM/PROM:  A/PROM RIGHT  05/26/2021   Shoulder extension   Shoulder flexion 95  Shoulder abduction 88  Shoulder internal rotation   Shoulder external rotation     (Blank rows = not tested)  A/PROM LEFT  05/26/2021  Shoulder extension   Shoulder flexion 82  Shoulder abduction 30  Shoulder internal rotation   Shoulder external rotation     (Blank rows = not tested)   CERVICAL AROM: All within normal limits:      UPPER EXTREMITY STRENGTH: WFL      (Blank rows = not tested)    LYMPHEDEMA ASSESSMENTS:   SURGERY TYPE/DATE: 01/25/2021 excisional biopsy, followed by reexcision on 02/14/2021  NUMBER OF LYMPH NODES REMOVED: 0  CHEMOTHERAPY: no  RADIATION:yes, final rx 04/19/2021  HORMONE TREATMENT: yes  INFECTIONS: no  LYMPHEDEMA ASSESSMENTS: circumference of chest;  LANDMARK RIGHT  05/26/2021  10 cm proximal to olecranon process 38.7  Olecranon process 26.8  10 cm proximal to ulnar styloid process 19.3  Just proximal to ulnar styloid process 15.0  Across hand at thumb web space 18.1  At base  of 2nd digit 5.8  Chest  109.3  (Blank rows = not tested)    LANDMARK LEFT  05/26/2021  10 cm proximal to olecranon process 37.9  Olecranon process 27.5  10 cm proximal to ulnar styloid process 18.9  Just proximal to ulnar styloid process 15.7  Across hand at thumb web space 18.3  At base of 2nd digit 5.8  (Blank rows = not tested)     QUICK DASH SURVEY: 73% Breast complaints scale: 27/8 questions   TODAY'S TREATMENT  Pts son assisted pts legs onto table and helped with bolster under knees.(Pt put on gown prior to getting on table with table elevated slightly. Assessed skin under right breast ;Mainly healed with 1 very small area still pink.  Advised pt it is OK to get compression bra now to help with lateral breast swelling Initiated MLD to the Right breast; Supraclavicular, bilateral Axillary LN's and right inguinal LN's, 5 breaths, anterior interaxillary pathway, right axillo inguinal pathway and right breast retracing pathways with emphasis on lateral breast to axillo-inguinal pathway, and ending with LN's. Explained to pt while performing. Therapist also performed some scar massage to lateral breast incision. Educated pt in supine clasped hands flexion and stargazer stretch with emphasis on stretching and no pain, and sitting or standing scapular retraction. PROM was performed to right shoulder for flexion, scaption and ER. Pts son assisted with getting pts legs off bolster and helping legs to floor. Table was raised slightly and pt was able to stand independently.   PATIENT EDUCATION:  Education details: supine clasped hands flexion and stargazer, standing scapular retraction Person educated: patient Education method: Theatre stage manager Education comprehension: verbalized understandingdemonstrated understanding   HOME EXERCISE PROGRAM: Supine clasped hands flexion and stargazer/standing scapular retraction  ASSESSMENT:  CLINICAL IMPRESSION: Pts son assisted PT  with helping his mom on and off the table with mom in gown before he came in to help. Initiated MLD and AAROM exs for pt. PT performed PROM to right shoulder. Pt continues with lateral breast swelling which has improved some. She will benefit from a compression bra and will schedule an appt. She had good tolerance for exs and PROM without shoulder pain or crepitus.  OBJECTIVE IMPAIRMENTS decreased ROM, decreased strength, increased edema, impaired UE functional use, postural dysfunction, obesity, and pain.   ACTIVITY LIMITATIONS cleaning, meal prep, and shopping.   PERSONAL FACTORS Age and 3+ comorbidities: Right breast Cancer s/p radiation, Bilateral TKA that do not bend well, bilateral shoulder OA, poor overall mobility  are also affecting patient's functional outcome.    REHAB POTENTIAL: Good  CLINICAL DECISION MAKING: Stable/uncomplicated  EVALUATION COMPLEXITY: Low  GOALS: Goals reviewed with patient? Yes  SHORT TERM GOALS: Target date: 07/06/2021  (Remove Blue Hyperlink)   Pt will have decreased swelling by 25% Baseline: Goal status: INITIAL  2.  Pt will report decreased pain by 25% Baseline:  Goal status: INITIAL    LONG TERM GOALS    1. Pt will report decreased breast swelling by 50% Goal Status: INITIAL  2. Pt will be independent in Right breast MLD Goal Status:INITIAL  3.  Pt will be independent with HEP for right shoulder ROM  PT FREQUENCY: 2x/week  PT DURATION: 6 weeks  PLANNED INTERVENTIONS: Therapeutic exercises, Patient/Family education, Joint mobilization, Manual lymph drainage, scar mobilization, Vasopneumatic device, and Manual therapy  PLAN FOR NEXT SESSION: try AAROM exs in supine with head of table elevated,, initiate right breast MLD. Have family assist pt onto table if needed. STM prn, PROM prn (has severe OA)   Claris Pong, PT 06/15/2021, 4:56 PM

## 2021-06-20 ENCOUNTER — Ambulatory Visit: Payer: Medicare PPO

## 2021-06-20 DIAGNOSIS — C50411 Malignant neoplasm of upper-outer quadrant of right female breast: Secondary | ICD-10-CM | POA: Diagnosis not present

## 2021-06-20 DIAGNOSIS — R600 Localized edema: Secondary | ICD-10-CM

## 2021-06-20 DIAGNOSIS — R293 Abnormal posture: Secondary | ICD-10-CM

## 2021-06-20 NOTE — Therapy (Signed)
OUTPATIENT PHYSICAL THERAPY ONCOLOGY TREATMENT  Patient Name: Vickie Collier MRN: [id number] DOB:Oct 12, 1950, 71 y.o., female Today's Date: 06/20/2021   PT End of Session - 06/20/21 1259     Visit Number 3    Number of Visits 12    Date for PT Re-Evaluation 07/07/21    PT Start Time 1302    PT Stop Time 1350    PT Time Calculation (min) 48 min    Activity Tolerance Patient tolerated treatment well    Behavior During Therapy [facility] for tasks assessed/performed             Past Medical History:  Diagnosis Date   Arthritis    Atypical ductal hyperplasia of right breast    Breast cancer ([place]) 01/25/2021   Family history of ovarian cancer    Family history of pancreatic cancer    Family history of prostate cancer    Family history of stomach cancer    GERD (gastroesophageal reflux disease)    Hyperlipidemia    Hypertension    Past Surgical History:  Procedure Laterality Date   ABDOMINAL HYSTERECTOMY     JOINT REPLACEMENT Bilateral    TKR   RADIOACTIVE SEED GUIDED EXCISIONAL BREAST BIOPSY Right 01/25/2021   Procedure: RADIOACTIVE SEED GUIDED EXCISIONAL RIGHT BREAST BIOPSY;  Surgeon: Stark Klein, MD;  Location: [place];  Service: General;  Laterality: Right;   RE-EXCISION OF BREAST LUMPECTOMY Right 02/14/2021   Procedure: RE-EXCISION OF RIGHT BREAST LUMPECTOMY;  Surgeon: Stark Klein, MD;  Location: [place];  Service: General;  Laterality: Right;   Patient Active Problem List   Diagnosis Date Noted   Genetic testing 04/03/2021   Family history of prostate cancer 03/22/2021   Family history of ovarian cancer 03/22/2021   Family history of pancreatic cancer 03/22/2021   Family history of stomach cancer 03/22/2021   Malignant neoplasm of upper-outer quadrant of right breast in female, estrogen receptor positive ([place]) 02/16/2021    PCP: Beatrix Shipper, MD  REFERRING PROVIDER: Wilber Bihari, NP  REFERRING DIAG: Right Breast  Cancer  THERAPY DIAG:  Malignant neoplasm of upper-outer quadrant of right breast in female, estrogen receptor positive ([place])  Abnormal posture  Localized edema  ONSET DATE: 10/06/2021  SUBJECTIVE                                                                                                                                                                                           SUBJECTIVE STATEMENT:  Under the breast is still a little sore, but I think the swelling has gone down some. The breast heaviness isn't as bad.I called today  about the compression bra but they haven't called back yet. Tightness under arm is getting a little better. No more sharp pains in the breast.   PERTINENT HISTORY: Pt had an abnormal right mammogram and pt underwent seed localized excisional biopsy 01/25/21. The pathology was positive for cancer and margins were not clear so she underwent a re-excision on 02/14/2021. She had radiation which ended on 04/19/2021. 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 No LN's were removed. It was ER=, PR +, and HER 2 -. Pt ambulates with a rollator with left leg outside rollator. She walks with stiff knees from prior TKA's, She does not flex well at the hips and has difficulty sitting erect. She requires assist to get up and down but family will help. She thinks she can get on the table with head of table elevated  PAIN:  Are you having pain? 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 Knees don't bend well, nor do her hips  FALLS:  Has  patient fallen in last 6 months? No  LIVING ENVIRONMENT: Lives with: lives with her granddaughter, just moved from [place] to be closer to her family. Lives in: House/apartment Stairs: Yes; Internal: 13 steps; on right going up Has following equipment at home: Walker - 4 wheeled, sleeps in lift chair  OCCUPATION: retired  LEISURE: be with family  HAND DOMINANCE : right   PRIOR LEVEL OF FUNCTION: Independent with household mobility with device, needs help with socks,uses lift chair to get up, help to bath legs  PATIENT GOALS Decrease pain and swelling, improve ROM   OBJECTIVE  COGNITION:  Overall cognitive status: Within functional limits for tasks assessed   PALPATION: Tender right lateral breast  OBSERVATIONS / OTHER ASSESSMENTS: macerated skin under right breast being treated with Neosporin, Dry desquamation medial breast, Swelling noted at lateral breast  with large healed incision lateral breast  SENSATION:  Light touch: Appears intact   POSTURE: forward head, rounded shoulders  UPPER EXTREMITY AROM/PROM:  A/PROM RIGHT  05/26/2021   Shoulder extension   Shoulder flexion 95  Shoulder abduction 88  Shoulder internal rotation   Shoulder external rotation     (Blank rows = not tested)  A/PROM LEFT  05/26/2021  Shoulder extension   Shoulder flexion 82  Shoulder abduction 30  Shoulder internal rotation   Shoulder external rotation     (Blank rows = not tested)   CERVICAL AROM: All within normal limits:      UPPER EXTREMITY STRENGTH: WFL      (Blank rows = not tested)    LYMPHEDEMA ASSESSMENTS:   SURGERY TYPE/DATE: 01/25/2021 excisional biopsy, followed by reexcision on 02/14/2021  NUMBER OF LYMPH NODES REMOVED: 0  CHEMOTHERAPY: no  RADIATION:yes, final rx 04/19/2021  HORMONE TREATMENT: yes  INFECTIONS: no  LYMPHEDEMA ASSESSMENTS: circumference of chest;  LANDMARK RIGHT  05/26/2021  10 cm proximal to olecranon process 38.7  Olecranon  process 26.8  10 cm proximal to ulnar styloid process 19.3  Just proximal to ulnar styloid process 15.0  Across hand at thumb web space 18.1  At base of 2nd  digit 5.8  Chest  109.3  (Blank rows = not tested)    LANDMARK LEFT  05/26/2021  10 cm proximal to olecranon process 37.9  Olecranon process 27.5  10 cm proximal to ulnar styloid process 18.9  Just proximal to ulnar styloid process 15.7  Across hand at thumb web space 18.3  At base of 2nd digit 5.8  (Blank rows = not tested)     QUICK DASH SURVEY: 73% Breast complaints scale: 27/8 questions   TODAY'S TREATMENT   06/20/2021 Pts son Kerry Dory assisted pts legs onto table and helped with bolster under knees.(Pt put on gown prior to getting on table with table elevated slightly. Assessed skin under right breast ;healed with 1 very small area still pink.  Advised pt it is OK to get compression bra now to help with lateral breast swelling Continued MLD to the Right breast; Supraclavicular, bilateral Axillary LN's and right inguinal LN's, 5 breaths, anterior interaxillary pathway, right axillo inguinal pathway and right breast retracing pathways with emphasis on lateral breast to axillo-inguinal pathway, and ending with LN's. Explained to pt while performing. Soft tissue mobilization to Right pectorals, UT, and lats in supine. PROM right shoulder Pt performed AA shoulder flexion and stargazer   06/15/2021 Pts son assisted pts legs onto table and helped with bolster under knees.(Pt put on gown prior to getting on table with table elevated slightly. Assessed skin under right breast ;Mainly healed with 1 very small area still pink.  Advised pt it is OK to get compression bra now to help with lateral breast swelling Initiated MLD to the Right breast; Supraclavicular, bilateral Axillary LN's and right inguinal LN's, 5 breaths, anterior interaxillary pathway, right axillo inguinal pathway and right breast retracing pathways with emphasis on  lateral breast to axillo-inguinal pathway, and ending with LN's. Explained to pt while performing. Therapist also performed some scar massage to lateral breast incision. Educated pt in supine clasped hands flexion and stargazer stretch with emphasis on stretching and no pain, and sitting or standing scapular retraction. PROM was performed to right shoulder for flexion, scaption and ER. Pts son assisted with getting pts legs off bolster and helping legs to floor. Table was raised slightly and pt was able to stand independently.   PATIENT EDUCATION:  Education details: supine clasped hands flexion and stargazer, standing scapular retraction Person educated: patient Education method: Theatre stage manager Education comprehension: verbalized understandingdemonstrated understanding   HOME EXERCISE PROGRAM: Supine clasped hands flexion and stargazer/standing scapular retraction  ASSESSMENT:  CLINICAL IMPRESSION: Pt continues with firmness/swelling at right lateral breast. She has not yet purchased her compression bra.  Crepitus noted in right shoulder as pt did stargazer stretch and was discontinued.  Good tolerance for PROM of right shoulder without crepitus  OBJECTIVE IMPAIRMENTS decreased ROM, decreased strength, increased edema, impaired UE functional use, postural dysfunction, obesity, and pain.   ACTIVITY LIMITATIONS cleaning, meal prep, and shopping.   PERSONAL FACTORS Age and 3+ comorbidities: Right breast Cancer s/p radiation, Bilateral TKA that do not bend well, bilateral shoulder OA, poor overall mobility  are also affecting patient's functional outcome.    REHAB POTENTIAL: Good  CLINICAL DECISION MAKING: Stable/uncomplicated  EVALUATION COMPLEXITY: Low  GOALS: Goals reviewed with patient? Yes  SHORT TERM GOALS: Target date: 07/11/2021  (Remove Blue Hyperlink)   Pt will have decreased swelling by 25% Baseline: Goal status: INITIAL  2.  Pt will report decreased pain  by 25% Baseline:  Goal status: INITIAL    LONG TERM GOALS  1. Pt will report decreased breast swelling by 50% Goal Status: INITIAL  2. Pt will be independent in Right breast MLD Goal Status:INITIAL  3.  Pt will be independent with HEP for right shoulder ROM  PT FREQUENCY: 2x/week  PT DURATION: 6 weeks  PLANNED INTERVENTIONS: Therapeutic exercises, Patient/Family education, Joint mobilization, Manual lymph drainage, scar mobilization, Vasopneumatic device, and Manual therapy  PLAN FOR NEXT SESSION:  Compression bra?,try AAROM exs in supine with head of table elevated,, continue right breast MLD. Have family assist pt onto table if needed. STM prn, PROM prn (has severe OA)   Claris Pong, PT 06/20/2021, 2:00 PM

## 2021-06-27 ENCOUNTER — Ambulatory Visit: Payer: Medicare PPO

## 2021-06-27 DIAGNOSIS — C50411 Malignant neoplasm of upper-outer quadrant of right female breast: Secondary | ICD-10-CM

## 2021-06-27 DIAGNOSIS — R293 Abnormal posture: Secondary | ICD-10-CM

## 2021-06-27 DIAGNOSIS — R600 Localized edema: Secondary | ICD-10-CM

## 2021-06-27 NOTE — Therapy (Signed)
OUTPATIENT PHYSICAL THERAPY ONCOLOGY TREATMENT  Patient Name: Vickie Collier MRN: [id number] DOB:07/16/1950, 71 y.o., female Today's Date: 06/27/2021   PT End of Session - 06/27/21 1302     Visit Number 4    Number of Visits 12    Date for PT Re-Evaluation 07/07/21    PT Start Time 1303    PT Stop Time 1700    PT Time Calculation (min) 52 min    Activity Tolerance Patient tolerated treatment well    Behavior During Therapy [facility] for tasks assessed/performed             Past Medical History:  Diagnosis Date   Arthritis    Atypical ductal hyperplasia of right breast    Breast cancer ([place]) 01/25/2021   Family history of ovarian cancer    Family history of pancreatic cancer    Family history of prostate cancer    Family history of stomach cancer    GERD (gastroesophageal reflux disease)    Hyperlipidemia    Hypertension    Past Surgical History:  Procedure Laterality Date   ABDOMINAL HYSTERECTOMY     JOINT REPLACEMENT Bilateral    TKR   RADIOACTIVE SEED GUIDED EXCISIONAL BREAST BIOPSY Right 01/25/2021   Procedure: RADIOACTIVE SEED GUIDED EXCISIONAL RIGHT BREAST BIOPSY;  Surgeon: Stark Klein, MD;  Location: [place];  Service: General;  Laterality: Right;   RE-EXCISION OF BREAST LUMPECTOMY Right 02/14/2021   Procedure: RE-EXCISION OF RIGHT BREAST LUMPECTOMY;  Surgeon: Stark Klein, MD;  Location: [place];  Service: General;  Laterality: Right;   Patient Active Problem List   Diagnosis Date Noted   Genetic testing 04/03/2021   Family history of prostate cancer 03/22/2021   Family history of ovarian cancer 03/22/2021   Family history of pancreatic cancer 03/22/2021   Family history of stomach cancer 03/22/2021   Malignant neoplasm of upper-outer quadrant of right breast in female, estrogen receptor positive ([place]) 02/16/2021    PCP: Beatrix Shipper, MD  REFERRING PROVIDER: Wilber Bihari, NP  REFERRING DIAG: Right Breast  Cancer  THERAPY DIAG:  Malignant neoplasm of upper-outer quadrant of right breast in female, estrogen receptor positive ([place])  Abnormal posture  Localized edema  ONSET DATE: 10/06/2021  SUBJECTIVE                                                                                                                                                                                           SUBJECTIVE STATEMENT: Still just a little sore where the spot that wasn't healing well is. Swelling seems better and doesn't feel heavy anymore. I am supposed to go  Monday to get the compression bra. The tightness under my arm is improving too.   PERTINENT HISTORY: Pt had an abnormal right mammogram and pt underwent seed localized excisional biopsy 01/25/21. The pathology was positive for cancer and margins were not clear so she underwent a re-excision on 02/14/2021. She had radiation which ended on 04/19/2021. 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 No LN's were removed. It was ER=, PR +, and HER 2 -. Pt ambulates with a rollator with left leg outside rollator. She walks with stiff knees from prior TKA's, She does not flex well at the hips and has difficulty sitting erect. She requires assist to get up and down but family will help. She thinks she can get on the table with head of table elevated  PAIN:  Are you having pain? 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 Knees don't bend well, nor do her hips  FALLS:  Has patient fallen in last 6 months? No  LIVING  ENVIRONMENT: Lives with: lives with her granddaughter, just moved from [place] to be closer to her family. Lives in: House/apartment Stairs: Yes; Internal: 13 steps; on right going up Has following equipment at home: Walker - 4 wheeled, sleeps in lift chair  OCCUPATION: retired  LEISURE: be with family  HAND DOMINANCE : right   PRIOR LEVEL OF FUNCTION: Independent with household mobility with device, needs help with socks,uses lift chair to get up, help to bath legs  PATIENT GOALS Decrease pain and swelling, improve ROM   OBJECTIVE  COGNITION:  Overall cognitive status: Within functional limits for tasks assessed   PALPATION: Tender right lateral breast  OBSERVATIONS / OTHER ASSESSMENTS: macerated skin under right breast being treated with Neosporin, Dry desquamation medial breast, Swelling noted at lateral breast  with large healed incision lateral breast  SENSATION:  Light touch: Appears intact   POSTURE: forward head, rounded shoulders  UPPER EXTREMITY AROM/PROM:  A/PROM RIGHT  05/26/2021   Shoulder extension   Shoulder flexion 95  Shoulder abduction 88  Shoulder internal rotation   Shoulder external rotation     (Blank rows = not tested)  A/PROM LEFT  05/26/2021  Shoulder extension   Shoulder flexion 82  Shoulder abduction 30  Shoulder internal rotation   Shoulder external rotation     (Blank rows = not tested)   CERVICAL AROM: All within normal limits:      UPPER EXTREMITY STRENGTH: WFL      (Blank rows = not tested)    LYMPHEDEMA ASSESSMENTS:   SURGERY TYPE/DATE: 01/25/2021 excisional biopsy, followed by reexcision on 02/14/2021  NUMBER OF LYMPH NODES REMOVED: 0  CHEMOTHERAPY: no  RADIATION:yes, final rx 04/19/2021  HORMONE TREATMENT: yes  INFECTIONS: no  LYMPHEDEMA ASSESSMENTS: circumference of chest;  LANDMARK RIGHT  05/26/2021  10 cm proximal to olecranon process 38.7  Olecranon process 26.8  10 cm proximal to ulnar styloid  process 19.3  Just proximal to ulnar styloid process 15.0  Across hand at thumb web space 18.1  At base of 2nd digit 5.8  Chest  109.3  (Blank rows = not  tested)    LANDMARK LEFT  05/26/2021  10 cm proximal to olecranon process 37.9  Olecranon process 27.5  10 cm proximal to ulnar styloid process 18.9  Just proximal to ulnar styloid process 15.7  Across hand at thumb web space 18.3  At base of 2nd digit 5.8  (Blank rows = not tested)     QUICK DASH SURVEY: 73% Breast complaints scale: 27/8 questions   TODAY'S TREATMENT   06/27/2021 Lateral breast scar massage prior to MLD Continued MLD to the Right breast; Supraclavicular,  right inguinal LN's, 5 breaths,  right axillo inguinal pathway and right lateral breast retracing pathways with emphasis on lateral breast to axillo-inguinal pathway, and ending with LN's. Explained to pt while performing and then had pt perform  all steps. Soft tissue mobilization to Right pectorals, UT, and lats in supine. PROM right shoulder Pt performed AA shoulder flexion and stargazer x 5 ea. Issued written instruction   06/20/2021 Pts son Kerry Dory assisted pts legs onto table and helped with bolster under knees.(Pt put on gown prior to getting on table with table elevated slightly. Assessed skin under right breast ;healed with 1 very small area still pink.  Advised pt it is OK to get compression bra now to help with lateral breast swelling Continued MLD to the Right breast; Supraclavicular, bilateral Axillary LN's and right inguinal LN's, 5 breaths, anterior interaxillary pathway, right axillo inguinal pathway and right breast retracing pathways with emphasis on lateral breast to axillo-inguinal pathway, and ending with LN's. Explained to pt while performing. Soft tissue mobilization to Right pectorals, UT, and lats in supine. PROM right shoulder Pt performed AA shoulder flexion and stargazer   06/15/2021 Pts son assisted pts legs onto table and helped  with bolster under knees.(Pt put on gown prior to getting on table with table elevated slightly. Assessed skin under right breast ;Mainly healed with 1 very small area still pink.  Advised pt it is OK to get compression bra now to help with lateral breast swelling Initiated MLD to the Right breast; Supraclavicular, bilateral Axillary LN's and right inguinal LN's, 5 breaths, anterior interaxillary pathway, right axillo inguinal pathway and right breast retracing pathways with emphasis on lateral breast to axillo-inguinal pathway, and ending with LN's. Explained to pt while performing. Therapist also performed some scar massage to lateral breast incision. Educated pt in supine clasped hands flexion and stargazer stretch with emphasis on stretching and no pain, and sitting or standing scapular retraction. PROM was performed to right shoulder for flexion, scaption and ER. Pts son assisted with getting pts legs off bolster and helping legs to floor. Table was raised slightly and pt was able to stand independently.   PATIENT EDUCATION:  Education details: supine clasped hands flexion and stargazer, standing scapular retraction Person educated: patient Education method: Theatre stage manager Education comprehension: verbalized understandingdemonstrated understanding   HOME EXERCISE PROGRAM: Supine clasped hands flexion and stargazer/standing scapular retraction  ASSESSMENT:  CLINICAL IMPRESSION: Pt continues with firmness/swelling at right lateral breast. She has not yet purchased her compression bra but has appt on Monday. Therapist peformed MLD and instructed pt in same. Required VC's and TC's but was able to do with minimal left shoulder discomfort. OBJECTIVE IMPAIRMENTS decreased ROM, decreased strength, increased edema, impaired UE functional use, postural dysfunction, obesity, and pain.   ACTIVITY LIMITATIONS cleaning, meal prep, and shopping.   PERSONAL FACTORS Age and 3+ comorbidities:  Right breast Cancer s/p radiation, Bilateral TKA that do not bend well, bilateral shoulder OA,  poor overall mobility  are also affecting patient's functional outcome.    REHAB POTENTIAL: Good  CLINICAL DECISION MAKING: Stable/uncomplicated  EVALUATION COMPLEXITY: Low  GOALS: Goals reviewed with patient? Yes  SHORT TERM GOALS: Target date: 07/18/2021  (Remove Blue Hyperlink)   Pt will have decreased swelling by 25% Baseline: Goal status: INITIAL  2.  Pt will report decreased pain by 25% Baseline:  Goal status: INITIAL    LONG TERM GOALS    1. Pt will report decreased breast swelling by 50% Goal Status: INITIAL  2. Pt will be independent in Right breast MLD Goal Status:INITIAL  3.  Pt will be independent with HEP for right shoulder ROM  PT FREQUENCY: 2x/week  PT DURATION: 6 weeks  PLANNED INTERVENTIONS: Therapeutic exercises, Patient/Family education, Joint mobilization, Manual lymph drainage, scar mobilization, Vasopneumatic device, and Manual therapy  PLAN FOR NEXT SESSION:  Compression bra?,try AAROM exs in supine with head of table elevated,, continue right breast MLD and review with pt to lateral breast. Have family assist pt onto table if needed. STM prn, PROM prn (has severe OA)   Claris Pong, PT 06/27/2021, 1:56 PM

## 2021-06-29 ENCOUNTER — Ambulatory Visit: Payer: Medicare PPO

## 2021-06-29 DIAGNOSIS — C50411 Malignant neoplasm of upper-outer quadrant of right female breast: Secondary | ICD-10-CM | POA: Diagnosis not present

## 2021-06-29 DIAGNOSIS — Z170 Estrogen receptor positive status [ER+]: Secondary | ICD-10-CM

## 2021-06-29 DIAGNOSIS — R600 Localized edema: Secondary | ICD-10-CM

## 2021-06-29 DIAGNOSIS — R293 Abnormal posture: Secondary | ICD-10-CM

## 2021-06-29 NOTE — Therapy (Signed)
OUTPATIENT PHYSICAL THERAPY ONCOLOGY TREATMENT  Patient Name: Vickie Collier MRN: [id number] DOB:02-Dec-1950, 71 y.o., female Today's Date: 06/29/2021   PT End of Session - 06/29/21 1312     Visit Number 5   late   Number of Visits 12    Date for PT Re-Evaluation 07/07/21    PT Start Time 1311    PT Stop Time 1350    PT Time Calculation (min) 39 min    Activity Tolerance Patient tolerated treatment well    Behavior During Therapy [facility] for tasks assessed/performed             Past Medical History:  Diagnosis Date   Arthritis    Atypical ductal hyperplasia of right breast    Breast cancer ([place]) 01/25/2021   Family history of ovarian cancer    Family history of pancreatic cancer    Family history of prostate cancer    Family history of stomach cancer    GERD (gastroesophageal reflux disease)    Hyperlipidemia    Hypertension    Past Surgical History:  Procedure Laterality Date   ABDOMINAL HYSTERECTOMY     JOINT REPLACEMENT Bilateral    TKR   RADIOACTIVE SEED GUIDED EXCISIONAL BREAST BIOPSY Right 01/25/2021   Procedure: RADIOACTIVE SEED GUIDED EXCISIONAL RIGHT BREAST BIOPSY;  Surgeon: Stark Klein, MD;  Location: [place];  Service: General;  Laterality: Right;   RE-EXCISION OF BREAST LUMPECTOMY Right 02/14/2021   Procedure: RE-EXCISION OF RIGHT BREAST LUMPECTOMY;  Surgeon: Stark Klein, MD;  Location: [place];  Service: General;  Laterality: Right;   Patient Active Problem List   Diagnosis Date Noted   Genetic testing 04/03/2021   Family history of prostate cancer 03/22/2021   Family history of ovarian cancer 03/22/2021   Family history of pancreatic cancer 03/22/2021   Family history of stomach cancer 03/22/2021   Malignant neoplasm of upper-outer quadrant of right breast in female, estrogen receptor positive ([place]) 02/16/2021    PCP: Beatrix Shipper, MD  REFERRING PROVIDER: Wilber Bihari, NP  REFERRING DIAG: Right Breast  Cancer  THERAPY DIAG:  Malignant neoplasm of upper-outer quadrant of right breast in female, estrogen receptor positive ([place])  Abnormal posture  Localized edema  ONSET DATE: 10/06/2021  SUBJECTIVE                                                                                                                                                                                           SUBJECTIVE STATEMENT: Breast is still swollen, but I go for my compression bra on Monday  PERTINENT HISTORY: Pt had an abnormal right mammogram and pt  underwent seed localized excisional biopsy 01/25/21. The pathology was positive for cancer and margins were not clear so she underwent a re-excision on 02/14/2021. She had radiation which ended on 04/19/2021. 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 No LN's were removed. It was ER=, PR +, and HER 2 -. Pt ambulates with a rollator with left leg outside rollator. She walks with stiff knees from prior TKA's, She does not flex well at the hips and has difficulty sitting erect. She requires assist to get up and down but family will help. She thinks she can get on the table with head of table elevated  PAIN:  Are you having pain? 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 Knees don't bend well, nor do her hips  FALLS:  Has patient fallen in last 6 months? No  LIVING ENVIRONMENT: Lives with: lives with her granddaughter, just moved from [place] to be closer to her family. Lives in: House/apartment Stairs: Yes; Internal: 13 steps; on right  going up Has following equipment at home: Walker - 4 wheeled, sleeps in lift chair  OCCUPATION: retired  LEISURE: be with family  HAND DOMINANCE : right   PRIOR LEVEL OF FUNCTION: Independent with household mobility with device, needs help with socks,uses lift chair to get up, help to bath legs  PATIENT GOALS Decrease pain and swelling, improve ROM   OBJECTIVE  COGNITION:  Overall cognitive status: Within functional limits for tasks assessed   PALPATION: Tender right lateral breast  OBSERVATIONS / OTHER ASSESSMENTS: macerated skin under right breast being treated with Neosporin, Dry desquamation medial breast, Swelling noted at lateral breast  with large healed incision lateral breast  SENSATION:  Light touch: Appears intact   POSTURE: forward head, rounded shoulders  UPPER EXTREMITY AROM/PROM:  A/PROM RIGHT  05/26/2021   Shoulder extension   Shoulder flexion 95  Shoulder abduction 88  Shoulder internal rotation   Shoulder external rotation     (Blank rows = not tested)  A/PROM LEFT  05/26/2021  Shoulder extension   Shoulder flexion 82  Shoulder abduction 30  Shoulder internal rotation   Shoulder external rotation     (Blank rows = not tested)   CERVICAL AROM: All within normal limits:      UPPER EXTREMITY STRENGTH: WFL      (Blank rows = not tested)    LYMPHEDEMA ASSESSMENTS:   SURGERY TYPE/DATE: 01/25/2021 excisional biopsy, followed by reexcision on 02/14/2021  NUMBER OF LYMPH NODES REMOVED: 0  CHEMOTHERAPY: no  RADIATION:yes, final rx 04/19/2021  HORMONE TREATMENT: yes  INFECTIONS: no  LYMPHEDEMA ASSESSMENTS: circumference of chest;  LANDMARK RIGHT  05/26/2021  10 cm proximal to olecranon process 38.7  Olecranon process 26.8  10 cm proximal to ulnar styloid process 19.3  Just proximal to ulnar styloid process 15.0  Across hand at thumb web space 18.1  At base of 2nd digit 5.8  Chest  109.3  (Blank rows = not tested)    LANDMARK  LEFT  05/26/2021  10 cm proximal to olecranon process 37.9  Olecranon process 27.5  10 cm proximal to ulnar styloid process 18.9  Just proximal to ulnar styloid process 15.7  Across hand at thumb web space 18.3  At base of 2nd digit 5.8  (Blank rows = not tested)     QUICK DASH SURVEY: 73% Breast complaints scale: 27/8 questions   TODAY'S TREATMENT  06/29/2021 Lateral breast scar massage prior to MLD Continued MLD to the Right breast; Supraclavicular,  right inguinal LN's, 5 breaths,  right axillo inguinal pathway and right lateral breast retracing pathways with emphasis on lateral breast to axillo-inguinal pathway, and ending with LN's. Explained to pt while performing . Sis not have pt perform today secondary to significant left shoulder pain.. Soft tissue mobilization to Right pectorals, UT, and lats in supine. PROM right shoulder   06/27/2021 Lateral breast scar massage prior to MLD Continued MLD to the Right breast; Supraclavicular,  right inguinal LN's, 5 breaths,  right axillo inguinal pathway and right lateral breast retracing pathways with emphasis on lateral breast to axillo-inguinal pathway, and ending with LN's. Explained to pt while performing and then had pt perform  all steps. Soft tissue mobilization to Right pectorals, UT, and lats in supine. PROM right shoulder Pt performed AA shoulder flexion and stargazer x 5 ea. Issued written instruction   06/20/2021 Pts son Kerry Dory assisted pts legs onto table and helped with bolster under knees.(Pt put on gown prior to getting on table with table elevated slightly. Assessed skin under right breast ;healed with 1 very small area still pink.  Advised pt it is OK to get compression bra now to help with lateral breast swelling Continued MLD to the Right breast; Supraclavicular, bilateral Axillary LN's and right inguinal LN's, 5 breaths, anterior interaxillary pathway, right axillo inguinal pathway and right breast retracing pathways  with emphasis on lateral breast to axillo-inguinal pathway, and ending with LN's. Explained to pt while performing. Soft tissue mobilization to Right pectorals, UT, and lats in supine. PROM right shoulder Pt performed AA shoulder flexion and stargazer   06/15/2021 Pts son assisted pts legs onto table and helped with bolster under knees.(Pt put on gown prior to getting on table with table elevated slightly. Assessed skin under right breast ;Mainly healed with 1 very small area still pink.  Advised pt it is OK to get compression bra now to help with lateral breast swelling Initiated MLD to the Right breast; Supraclavicular, bilateral Axillary LN's and right inguinal LN's, 5 breaths, anterior interaxillary pathway, right axillo inguinal pathway and right breast retracing pathways with emphasis on lateral breast to axillo-inguinal pathway, and ending with LN's. Explained to pt while performing. Therapist also performed some scar massage to lateral breast incision. Educated pt in supine clasped hands flexion and stargazer stretch with emphasis on stretching and no pain, and sitting or standing scapular retraction. PROM was performed to right shoulder for flexion, scaption and ER. Pts son assisted with getting pts legs off bolster and helping legs to floor. Table was raised slightly and pt was able to stand independently.   PATIENT EDUCATION:  Education details: supine clasped hands flexion and stargazer, standing scapular retraction Person educated: patient Education method: Theatre stage manager Education comprehension: verbalized understandingdemonstrated understanding   HOME EXERCISE PROGRAM: Supine clasped hands flexion and stargazer/standing scapular retraction  ASSESSMENT:  CLINICAL IMPRESSION: Pt continues with significant right breast swelling and enlarged pores. Did not have her try MLD today because she was having significant left shoulder pain.  She will get her compression bra on  Monday and this should help. Right shoulder ROM continues to be limited  by OA   OBJECTIVE IMPAIRMENTS decreased ROM, decreased strength, increased edema, impaired UE functional use, postural dysfunction, obesity, and pain.   ACTIVITY LIMITATIONS cleaning, meal prep, and shopping.   PERSONAL FACTORS Age and 3+ comorbidities: Right breast Cancer s/p radiation, Bilateral TKA that do not bend well, bilateral shoulder OA, poor overall mobility  are also affecting patient's functional outcome.    REHAB POTENTIAL: Good  CLINICAL DECISION MAKING: Stable/uncomplicated  EVALUATION COMPLEXITY: Low  GOALS: Goals reviewed with patient? Yes  SHORT TERM GOALS: Target date: 07/20/2021  (Remove Blue Hyperlink)   Pt will have decreased swelling by 25% Baseline: Goal status: INITIAL  2.  Pt will report decreased pain by 25% Baseline:  Goal status: INITIAL    LONG TERM GOALS    1. Pt will report decreased breast swelling by 50% Goal Status: INITIAL  2. Pt will be independent in Right breast MLD Goal Status:INITIAL  3.  Pt will be independent with HEP for right shoulder ROM  PT FREQUENCY: 2x/week  PT DURATION: 6 weeks  PLANNED INTERVENTIONS: Therapeutic exercises, Patient/Family education, Joint mobilization, Manual lymph drainage, scar mobilization, Vasopneumatic device, and Manual therapy  PLAN FOR NEXT SESSION:  Compression bra?,try AAROM exs in supine with head of table elevated,, continue right breast MLD and review with pt to lateral breast. Have family assist pt onto table if needed. STM prn, PROM prn (has severe OA)   Claris Pong, PT 06/29/2021, 1:58 PM

## 2021-07-05 ENCOUNTER — Ambulatory Visit: Payer: Medicare PPO

## 2021-07-11 ENCOUNTER — Ambulatory Visit: Payer: Medicare PPO

## 2021-07-11 DIAGNOSIS — R600 Localized edema: Secondary | ICD-10-CM

## 2021-07-11 DIAGNOSIS — C50411 Malignant neoplasm of upper-outer quadrant of right female breast: Secondary | ICD-10-CM

## 2021-07-11 DIAGNOSIS — R293 Abnormal posture: Secondary | ICD-10-CM
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
## 2021-07-20 ENCOUNTER — Ambulatory Visit: Payer: Medicare PPO

## 2021-08-14 ENCOUNTER — Ambulatory Visit: Payer: Medicare PPO | Attending: Adult Health

## 2021-08-14 DIAGNOSIS — Z170 Estrogen receptor positive status [ER+]: Secondary | ICD-10-CM | POA: Diagnosis present

## 2021-08-14 DIAGNOSIS — R293 Abnormal posture: Secondary | ICD-10-CM | POA: Insufficient documentation

## 2021-08-14 DIAGNOSIS — C50411 Malignant neoplasm of upper-outer quadrant of right female breast: Secondary | ICD-10-CM | POA: Insufficient documentation

## 2021-08-14 DIAGNOSIS — R600 Localized edema: Secondary | ICD-10-CM | POA: Diagnosis present

## 2021-08-14 NOTE — Patient Instructions (Signed)
Self manual lymph drainage:      [facility] [phone] Perform this sequence once a day.  Only give enough pressure to your skin to make the skin move.  Hug yourself.  Do circles at your neck just above your collarbones.  Repeat this 10 times.  Diaphragmatic - Supine   Inhale through nose making navel move out toward hands. Exhale through puckered lips, hands follow navel in. Repeat _5__ times. Rest _10__ seconds between repeats.    Axilla - One at a Time   Using full weight of flat hand and fingers at center of uninvolved armpit, make _10__ in-place circles.   Copyright  VHI. All rights reserved.  LEG: Inguinal Nodes Stimulation   With small finger side of hand against hip crease on involved side, gently perform circles at the crease. Repeat __10_ times.   Copyright  VHI. All rights reserved.  Axilla to Inguinal Nodes - Sweep   On involved side, pump _4__ times from armpit along side of trunk to hip crease.  Now gently stretch skin from the involved side to the uninvolved side across the chest at the shoulder line.  Repeat that 4 times.  Draw an imaginary diagonal line from upper outer breast through the nipple area toward lower inner breast.  Direct fluid upward and inward from this line toward the pathway across your upper chest .  Do this in three rows to treat all of the upper inner breast tissue, and do each row 3-4x.      Direct fluid to treat all of lower outer breast tissue downward and outward toward pathway that is aimed at the left groin.  Finish by doing the pathways as described above going from your involved armpit to the same side groin and going across your upper chest from the involved shoulder to the uninvolved shoulder.  Repeat the steps above where you do circles in your right groin and left armpit. Copyright  VHI. All rights reserved.

## 2021-08-14 NOTE — Therapy (Addendum)
OUTPATIENT PHYSICAL THERAPY ONCOLOGY TREATMENT  Patient Name: Vickie Collier MRN: [id number] DOB:[birth year], 71 y.o., female Today's Date: 08/14/2021   PT End of Session - 08/14/21 1549     Visit Number 7    Number of Visits 14    Date for PT Re-Evaluation 08/08/21   D/C this visit   PT Start Time 1502    PT Stop Time 2263    PT Time Calculation (min) 46 min    Activity Tolerance Patient tolerated treatment well    Behavior During Therapy [facility] for tasks assessed/performed              Past Medical History:  Diagnosis Date   Arthritis    Atypical ductal hyperplasia of right breast    Breast cancer ([place]) 01/25/2021   Family history of ovarian cancer    Family history of pancreatic cancer    Family history of prostate cancer    Family history of stomach cancer    GERD (gastroesophageal reflux disease)    Hyperlipidemia    Hypertension    Past Surgical History:  Procedure Laterality Date   ABDOMINAL HYSTERECTOMY     JOINT REPLACEMENT Bilateral    TKR   RADIOACTIVE SEED GUIDED EXCISIONAL BREAST BIOPSY Right 01/25/2021   Procedure: RADIOACTIVE SEED GUIDED EXCISIONAL RIGHT BREAST BIOPSY;  Surgeon: Stark Klein, MD;  Location: [place];  Service: General;  Laterality: Right;   RE-EXCISION OF BREAST LUMPECTOMY Right 02/14/2021   Procedure: RE-EXCISION OF RIGHT BREAST LUMPECTOMY;  Surgeon: Stark Klein, MD;  Location: [place];  Service: General;  Laterality: Right;   Patient Active Problem List   Diagnosis Date Noted   Genetic testing 04/03/2021   Family history of prostate cancer 03/22/2021   Family history of ovarian cancer 03/22/2021   Family history of pancreatic cancer 03/22/2021   Family history of stomach cancer 03/22/2021   Malignant neoplasm of upper-outer quadrant of right breast in female, estrogen receptor positive ([place]) 02/16/2021    PCP: Beatrix Shipper, MD  REFERRING PROVIDER: Wilber Bihari, NP  REFERRING DIAG: Right  Breast Cancer  THERAPY DIAG:  Malignant neoplasm of upper-outer quadrant of right breast in female, estrogen receptor positive ([place])  Abnormal posture  Localized edema  ONSET DATE: 10/06/2021  SUBJECTIVE                                                                                                                                                                                           SUBJECTIVE STATEMENT: I've been doing good since I was here last. I got my compression bra and it fits well though it does  ride up my chest some. I've also been doing the self MLD to my Rt breast about 2x/day and it feels a lot better. My incision is still a little firm and that's really my only complaint now. I'm ready to D/C.  PERTINENT HISTORY: Pt had an abnormal right mammogram and pt underwent seed localized excisional biopsy 01/25/21. The pathology was positive for cancer and margins were not clear so she underwent a re-excision on 02/14/2021. She had radiation which ended on 04/19/2021. 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 No LN's were removed. It was ER=, PR +, and HER 2 -. Pt ambulates with a rollator with left leg outside rollator. She walks with stiff knees from prior TKA's, She does not flex well at the hips and has difficulty sitting erect. She requires assist to get up and down but family will help. She thinks she can get on the table with head of table elevated  PAIN:  Are you having pain? 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 Knees don't bend well, nor do her  hips  FALLS:  Has patient fallen in last 6 months? No  LIVING ENVIRONMENT: Lives with: lives with her granddaughter, just moved from [place] to be closer to her family. Lives in: House/apartment Stairs: Yes; Internal: 13 steps; on right going up Has following equipment at home: Walker - 4 wheeled, sleeps in lift chair  OCCUPATION: retired  LEISURE: be with family  HAND DOMINANCE : right   PRIOR LEVEL OF FUNCTION: Independent with household mobility with device, needs help with socks,uses lift chair to get up, help to bath legs  PATIENT GOALS Decrease pain and swelling, improve ROM   OBJECTIVE  COGNITION:  Overall cognitive status: Within functional limits for tasks assessed   PALPATION: Tender right lateral breast  OBSERVATIONS / OTHER ASSESSMENTS: macerated skin under right breast being treated with Neosporin, Dry desquamation medial breast, Swelling noted at lateral breast  with large healed incision lateral breast  SENSATION:  Light touch: Appears intact   POSTURE: forward head, rounded shoulders  UPPER EXTREMITY AROM/PROM:  A/PROM RIGHT  05/26/2021  07/11/2021  Shoulder extension    Shoulder flexion 95 85 pain  Shoulder abduction 87 75 pain  Shoulder internal rotation    Shoulder external rotation      (Blank rows = not tested)  A/PROM LEFT  05/26/2021  Shoulder extension   Shoulder flexion 82  Shoulder abduction 30  Shoulder internal rotation   Shoulder external rotation     (Blank rows = not tested)   CERVICAL AROM: All within normal limits:      UPPER EXTREMITY STRENGTH: WFL      (Blank rows = not tested)    LYMPHEDEMA ASSESSMENTS:   SURGERY TYPE/DATE: 01/25/2021 excisional biopsy, followed by reexcision on 02/14/2021  NUMBER OF LYMPH NODES REMOVED: 0  CHEMOTHERAPY: no  RADIATION:yes, final rx 04/19/2021  HORMONE TREATMENT: yes  INFECTIONS: no  LYMPHEDEMA ASSESSMENTS: circumference of chest;  [facility] RIGHT  05/26/2021 07/11/2021   10 cm proximal to olecranon process 38.7   Olecranon process 26.8   10 cm proximal to ulnar styloid  process 19.3   Just proximal to ulnar styloid process 15.0   Across hand at thumb web space 18.1   At base of 2nd digit 5.8   Chest  109.3 110  (Blank rows = not tested)    [facility] LEFT  05/26/2021  10 cm proximal to olecranon process 37.9  Olecranon process 27.5  10 cm proximal to ulnar styloid process 18.9  Just proximal to ulnar styloid process 15.7  Across hand at thumb web space 18.3  At base of 2nd digit 5.8  (Blank rows = not tested)     QUICK DASH SURVEY: 73% Breast complaints scale: 27/8 questions   TODAY'S TREATMENT  08/14/21 Manual Therapy Supine with HOB elevated and assisted pt with getting her legs onto mat table for following: Short neck, Lt axilla and Rt inguinal nodes, anterior inter-axillary and Rt axillo-inguinal anastomosis, then focused on Rt breast reviewing technique with pt. Scar tissue massage to lateral breast incision  Gentle P/ROM of Rt shoulder to pts available end motions which was ~ 100 degrees of flex and 90 degrees of abd  07/11/2021 Pt requested treatment in sitting today secondary to no one available to help onto table. Checked goals for recert. Lateral breast scar massage prior to MLD Continued MLD to the Right breast; Supraclavicular,  right inguinal LN's, 5 breaths,  right axillo inguinal pathway and right lateral breast retracing pathways with emphasis on lateral breast to axillo-inguinal pathway, and ending with LN's.   06/29/2021 Lateral breast scar massage prior to MLD Continued MLD to the Right breast; Supraclavicular,  right inguinal LN's, 5 breaths,  right axillo inguinal pathway and right lateral breast retracing pathways with emphasis on lateral breast to axillo-inguinal pathway, and ending with LN's. Explained to pt while performing . Sis not have pt perform today secondary to significant left shoulder pain.. Soft tissue  mobilization to Right pectorals, UT, and lats in supine. PROM right shoulder    PATIENT EDUCATION:  Education details: supine clasped hands flexion and stargazer, standing scapular retraction Person educated: patient Education method: Theatre stage manager Education comprehension: verbalized understandingdemonstrated understanding   HOME EXERCISE PROGRAM: Supine clasped hands flexion and stargazer/standing scapular retraction  ASSESSMENT:  CLINICAL IMPRESSION: Pt has made excellent progress since here last about 1 month ago. She has received her compression bra and wears this daily. She also has been performing her self MLD 2x/day and is able to demonstrate good technique and understanding of sequence. She no longer has enlarged pores on her breast and only has mild fibrosis around her incision which is where she reports feeling some tightness still, from the scar tissue. She has met her goals and is ready to be discharged at this time.    OBJECTIVE IMPAIRMENTS decreased ROM, decreased strength, increased edema, impaired UE functional use, postural dysfunction, obesity, and pain.   ACTIVITY LIMITATIONS cleaning, meal prep, and shopping.   PERSONAL FACTORS Age and 3+ comorbidities: Right breast Cancer s/p radiation, Bilateral TKA that do not bend well, bilateral shoulder OA, poor overall mobility  are also affecting patient's functional outcome.    REHAB POTENTIAL: Good  CLINICAL DECISION MAKING: Stable/uncomplicated  EVALUATION COMPLEXITY: Low  GOALS: Goals reviewed with patient? Yes  SHORT TERM GOALS: Target date: 09/04/2021  (Remove Blue Hyperlink)   Pt will have decreased breast swelling by 25% Baseline: Goal status: MET  2.  Pt will report decreased pain by 25% Baseline:  Goal status: MET    LONG TERM GOALS    1. Pt will  report decreased breast swelling by 50% Goal Status: MET- Pt reports 70% improvement at this time - 08/14/21  2. Pt will be independent in  Right breast MLD Goal Status: MET - Pt reports doing this about 2x/day - 08/14/21  3.  Pt will be independent with HEP for right shoulder ROM.             MET  PT FREQUENCY: 2x/week  PT DURATION: 4 weeks  PLANNED INTERVENTIONS: Therapeutic exercises, Patient/Family education, Joint mobilization, Manual lymph drainage, scar mobilization, Vasopneumatic device, and Manual therapy  PLAN FOR NEXT SESSION:  D/C this visit   Otelia Limes, PTA 08/14/2021, 4:03 PM   Self manual lymph drainage:      Cancer Rehab [phone] Perform this sequence once a day.  Only give enough pressure to your skin to make the skin move.  Hug yourself.  Do circles at your neck just above your collarbones.  Repeat this 10 times.  Diaphragmatic - Supine   Inhale through nose making navel move out toward hands. Exhale through puckered lips, hands follow navel in. Repeat _5__ times. Rest _10__ seconds between repeats.    Axilla - One at a Time   Using full weight of flat hand and fingers at center of uninvolved armpit, make _10__ in-place circles.   Copyright  VHI. All rights reserved.  LEG: Inguinal Nodes Stimulation   With small finger side of hand against hip crease on involved side, gently perform circles at the crease. Repeat __10_ times.   Copyright  VHI. All rights reserved.  Axilla to Inguinal Nodes - Sweep   On involved side, pump _4__ times from armpit along side of trunk to hip crease.  Now gently stretch skin from the involved side to the uninvolved side across the chest at the shoulder line.  Repeat that 4 times.  Draw an imaginary diagonal line from upper outer breast through the nipple area toward lower inner breast.  Direct fluid upward and inward from this line toward the pathway across your upper chest .  Do this in three rows to treat all of the upper inner breast tissue, and do each row 3-4x.      Direct fluid to treat all of lower outer breast tissue downward and  outward toward pathway that is aimed at the left groin.  Finish by doing the pathways as described above going from your involved armpit to the same side groin and going across your upper chest from the involved shoulder to the uninvolved shoulder.  Repeat the steps above where you do circles in your right groin and left armpit.   PHYSICAL THERAPY DISCHARGE SUMMARY  Visits from Start of Care: 7  Current functional level related to goals / functional outcomes: See above for objective findings. Goals met.   Remaining deficits: Continues to have ROM deficits but feels ready for D/C and has met goals.   Education / Equipment: HEP and instructed with self manual lymph drainage   Patient agrees to discharge. Patient goals were met. Patient is being discharged due to meeting the stated rehab goals.  Annia Friendly, [place] 08/14/21 4:30 PM

## 2021-08-18 ENCOUNTER — Encounter: Payer: Medicare PPO | Admitting: Rehabilitation

## 2021-08-21 ENCOUNTER — Ambulatory Visit: Payer: Medicare PPO | Admitting: Rehabilitation

## 2021-08-24 ENCOUNTER — Ambulatory Visit: Payer: Medicare PPO | Admitting: Rehabilitation

## 2021-08-31 ENCOUNTER — Encounter: Payer: Medicare PPO | Admitting: Rehabilitation

## 2021-09-04 ENCOUNTER — Encounter: Payer: Medicare PPO | Admitting: Rehabilitation

## 2021-09-07 ENCOUNTER — Encounter: Payer: Medicare PPO | Admitting: Rehabilitation

## 2021-11-01 ENCOUNTER — Other Ambulatory Visit: Payer: Self-pay | Admitting: Radiology

## 2021-11-24 ENCOUNTER — Telehealth: Payer: Self-pay | Admitting: Hematology and Oncology

## 2021-11-24 NOTE — Telephone Encounter (Signed)
Called patient per 11/10 in basket. Left voicemail for upcoming appointment.

## 2021-11-26 NOTE — Progress Notes (Incomplete)
Patient Care Team: Lucianne Lei, MD as PCP - General (Family Medicine) Nicholas Lose, MD as Consulting Physician (Hematology and Oncology) Kyung Rudd, MD as Consulting Physician (Radiation Oncology) Stark Klein, MD as Consulting Physician (General Surgery)  DIAGNOSIS: No diagnosis found.  SUMMARY OF ONCOLOGIC HISTORY: Oncology History  Malignant neoplasm of upper-outer quadrant of right breast in female, estrogen receptor positive ([place])  01/25/2021 Surgery   Right lumpectomy: Grade 1 invasive lobular cancer, 4 mm, LCIS, DCIS grade 1-2, margins positive, DCIS posterior margin positive, ER 100%, PR 100%, Ki-67 10%, HER2 2+ by IHC, FISH negative   02/20/2021 Cancer Staging   Staging form: Breast, AJCC 8th Edition - Pathologic stage from 02/20/2021: Stage IA (pT1a, pN0, cM0, G1, ER+, PR+, HER2-) - Signed by Gardenia Phlegm, NP on 05/19/2021 Stage prefix: Initial diagnosis Method of lymph node assessment: Clinical Multigene prognostic tests performed: None Histologic grading system: 3 grade system   03/22/2021 - 04/19/2021 Radiation Therapy   Site Technique Total Dose (Gy) Dose per Fx (Gy) Completed Fx Beam Energies  Breast, Right: Breast_R 3D 42.56/42.56 2.66 16/16 10X  Breast, Right: Breast_R_Bst 3D 10/10 2.5 4/4 10X     04/03/2021 Genetic Testing   Genetic testing reported through the CancerNext-Expanded+RNAinsight cancer panel found no pathogenic mutations.   The CancerNext-Expanded gene panel offered by [facility] and includes sequencing and rearrangement analysis for the following 77 genes: AIP, ALK, APC*, ATM*, AXIN2, BAP1, BARD1, BLM, BMPR1A, BRCA1*, BRCA2*, BRIP1*, CDC73, CDH1*, CDK4, CDKN1B, CDKN2A, CHEK2*, CTNNA1, DICER1, FANCC, FH, FLCN, GALNT12, KIF1B, LZTR1, MAX, MEN1, MET, MLH1*, MSH2*, MSH3, MSH6*, MUTYH*, NBN, NF1*, NF2, NTHL1, PALB2*, PHOX2B, PMS2*, POT1, PRKAR1A, PTCH1, PTEN*, RAD51C*, RAD51D*, RB1, RECQL, RET, SDHA, SDHAF2, SDHB, SDHC, SDHD, SMAD4, SMARCA4,  SMARCB1, SMARCE1, STK11, SUFU, TMEM127, TP53*, TSC1, TSC2, VHL and XRCC2 (sequencing and deletion/duplication); EGFR, EGLN1, HOXB13, KIT, MITF, PDGFRA, POLD1, and POLE (sequencing only); EPCAM and GREM1 (deletion/duplication only). DNA and RNA analyses performed for * genes.    04/2021 -  Anti-estrogen oral therapy   Letrozole daily     CHIEF COMPLIANT:   INTERVAL HISTORY: Vickie Collier is a   ALLERGIES:  is allergic to lisinopril.  MEDICATIONS:  Current Outpatient Medications  Medication Sig Dispense Refill   amLODipine (NORVASC) 10 MG tablet Take 10 mg by mouth daily.     aspirin EC 81 MG tablet Take 81 mg by mouth daily. Swallow whole.     baclofen (LIORESAL) 10 MG tablet Take 10 mg by mouth 2 (two) times daily.     cholecalciferol (VITAMIN D3) 25 MCG (1000 UNIT) tablet Take 1,000 Units by mouth daily.     cyanocobalamin 100 MCG tablet Take 100 mcg by mouth daily.     felodipine (PLENDIL) 5 MG 24 hr tablet Take 5 mg by mouth daily.     letrozole (FEMARA) 2.5 MG tablet Take 1 tablet (2.5 mg total) by mouth daily. 90 tablet 3   metoprolol succinate (TOPROL-XL) 50 MG 24 hr tablet Take 50 mg by mouth daily. Take with or immediately following a meal.     oxyCODONE (OXY IR/ROXICODONE) 5 MG immediate release tablet Take 1 tablet (5 mg total) by mouth every 6 (six) hours as needed for severe pain. 8 tablet 0   pregabalin (LYRICA) 150 MG capsule Take 150 mg by mouth 2 (two) times daily.     rosuvastatin (CRESTOR) 5 MG tablet Take 5 mg by mouth daily.     valsartan-hydrochlorothiazide (DIOVAN-HCT) 80-12.5 MG tablet Take 1 tablet by  mouth daily.     No current facility-administered medications for this visit.    PHYSICAL EXAMINATION: ECOG PERFORMANCE STATUS: {CHL ONC ECOG PS:[id number]}  There were no vitals filed for this visit. There were no vitals filed for this visit.  BREAST:*** No palpable masses or nodules in either right or left breasts. No palpable axillary supraclavicular or  infraclavicular adenopathy no breast tenderness or nipple discharge. (exam performed in the presence of a chaperone)  LABORATORY DATA:  I have reviewed the data as listed    Latest Ref Rng & Units 01/20/2021    2:00 PM  CMP  Glucose 70 - 99 mg/dL 88   BUN 8 - 23 mg/dL 20   Creatinine 0.44 - 1.00 mg/dL 1.39   Sodium 135 - 145 mmol/L 139   Potassium 3.5 - 5.1 mmol/L 4.3   Chloride 98 - 111 mmol/L 105   CO2 22 - 32 mmol/L 24   Calcium 8.9 - 10.3 mg/dL 9.7     No results found for: "WBC", "HGB", "HCT", "MCV", "PLT", "NEUTROABS"  ASSESSMENT & PLAN:  No problem-specific Assessment & Plan notes found for this encounter.    No orders of the defined types were placed in this encounter.  The patient has a good understanding of the overall plan. she agrees with it. she will call with any problems that may develop before the next visit here. Total time spent: 30 mins including face to face time and time spent for planning, charting and co-ordination of care   Suzzette Righter, [place] 11/26/21    I Gardiner Coins am scribing for Dr. Lindi Adie  ***

## 2021-11-28 ENCOUNTER — Inpatient Hospital Stay: Payer: Medicare PPO | Admitting: Hematology and Oncology

## 2021-12-12 NOTE — Progress Notes (Signed)
Patient Care Team: Lucianne Lei, MD as PCP - General (Family Medicine) Nicholas Lose, MD as Consulting Physician (Hematology and Oncology) Kyung Rudd, MD as Consulting Physician (Radiation Oncology) Stark Klein, MD as Consulting Physician (General Surgery)  DIAGNOSIS: No diagnosis found.  SUMMARY OF ONCOLOGIC HISTORY: Oncology History  Malignant neoplasm of upper-outer quadrant of right breast in female, estrogen receptor positive ([place])  01/25/2021 Surgery   Right lumpectomy: Grade 1 invasive lobular cancer, 4 mm, LCIS, DCIS grade 1-2, margins positive, DCIS posterior margin positive, ER 100%, PR 100%, Ki-67 10%, HER2 2+ by IHC, FISH negative   02/20/2021 Cancer Staging   Staging form: Breast, AJCC 8th Edition - Pathologic stage from 02/20/2021: Stage IA (pT1a, pN0, cM0, G1, ER+, PR+, HER2-) - Signed by Gardenia Phlegm, NP on 05/19/2021 Stage prefix: Initial diagnosis Method of lymph node assessment: Clinical Multigene prognostic tests performed: None Histologic grading system: 3 grade system   03/22/2021 - 04/19/2021 Radiation Therapy   Site Technique Total Dose (Gy) Dose per Fx (Gy) Completed Fx Beam Energies  Breast, Right: Breast_R 3D 42.56/42.56 2.66 16/16 10X  Breast, Right: Breast_R_Bst 3D 10/10 2.5 4/4 10X     04/03/2021 Genetic Testing   Genetic testing reported through the CancerNext-Expanded+RNAinsight cancer panel found no pathogenic mutations.   The CancerNext-Expanded gene panel offered by [facility] and includes sequencing and rearrangement analysis for the following 77 genes: AIP, ALK, APC*, ATM*, AXIN2, BAP1, BARD1, BLM, BMPR1A, BRCA1*, BRCA2*, BRIP1*, CDC73, CDH1*, CDK4, CDKN1B, CDKN2A, CHEK2*, CTNNA1, DICER1, FANCC, FH, FLCN, GALNT12, KIF1B, LZTR1, MAX, MEN1, MET, MLH1*, MSH2*, MSH3, MSH6*, MUTYH*, NBN, NF1*, NF2, NTHL1, PALB2*, PHOX2B, PMS2*, POT1, PRKAR1A, PTCH1, PTEN*, RAD51C*, RAD51D*, RB1, RECQL, RET, SDHA, SDHAF2, SDHB, SDHC, SDHD, SMAD4, SMARCA4,  SMARCB1, SMARCE1, STK11, SUFU, TMEM127, TP53*, TSC1, TSC2, VHL and XRCC2 (sequencing and deletion/duplication); EGFR, EGLN1, HOXB13, KIT, MITF, PDGFRA, POLD1, and POLE (sequencing only); EPCAM and GREM1 (deletion/duplication only). DNA and RNA analyses performed for * genes.    04/2021 -  Anti-estrogen oral therapy   Letrozole daily     CHIEF COMPLIANT: History of right breast ADH   INTERVAL HISTORY: Vickie Collier is a 71 y.o. female is here because of history of right breast ADH.  She presents to the clinic for a follow-up.    ALLERGIES:  is allergic to lisinopril.  MEDICATIONS:  Current Outpatient Medications  Medication Sig Dispense Refill   amLODipine (NORVASC) 10 MG tablet Take 10 mg by mouth daily.     aspirin EC 81 MG tablet Take 81 mg by mouth daily. Swallow whole.     baclofen (LIORESAL) 10 MG tablet Take 10 mg by mouth 2 (two) times daily.     cholecalciferol (VITAMIN D3) 25 MCG (1000 UNIT) tablet Take 1,000 Units by mouth daily.     cyanocobalamin 100 MCG tablet Take 100 mcg by mouth daily.     felodipine (PLENDIL) 5 MG 24 hr tablet Take 5 mg by mouth daily.     letrozole (FEMARA) 2.5 MG tablet Take 1 tablet (2.5 mg total) by mouth daily. 90 tablet 3   metoprolol succinate (TOPROL-XL) 50 MG 24 hr tablet Take 50 mg by mouth daily. Take with or immediately following a meal.     oxyCODONE (OXY IR/ROXICODONE) 5 MG immediate release tablet Take 1 tablet (5 mg total) by mouth every 6 (six) hours as needed for severe pain. 8 tablet 0   pregabalin (LYRICA) 150 MG capsule Take 150 mg by mouth 2 (two) times daily.  rosuvastatin (CRESTOR) 5 MG tablet Take 5 mg by mouth daily.     valsartan-hydrochlorothiazide (DIOVAN-HCT) 80-12.5 MG tablet Take 1 tablet by mouth daily.     No current facility-administered medications for this visit.    PHYSICAL EXAMINATION: ECOG PERFORMANCE STATUS: {CHL ONC ECOG PS:[id number]}  There were no vitals filed for this visit. There were no vitals  filed for this visit.  BREAST:*** No palpable masses or nodules in either right or left breasts. No palpable axillary supraclavicular or infraclavicular adenopathy no breast tenderness or nipple discharge. (exam performed in the presence of a chaperone)  LABORATORY DATA:  I have reviewed the data as listed    Latest Ref Rng & Units 01/20/2021    2:00 PM  CMP  Glucose 70 - 99 mg/dL 88   BUN 8 - 23 mg/dL 20   Creatinine 0.44 - 1.00 mg/dL 1.39   Sodium 135 - 145 mmol/L 139   Potassium 3.5 - 5.1 mmol/L 4.3   Chloride 98 - 111 mmol/L 105   CO2 22 - 32 mmol/L 24   Calcium 8.9 - 10.3 mg/dL 9.7     No results found for: "WBC", "HGB", "HCT", "MCV", "PLT", "NEUTROABS"  ASSESSMENT & PLAN:  No problem-specific Assessment & Plan notes found for this encounter.    No orders of the defined types were placed in this encounter.  The patient has a good understanding of the overall plan. she agrees with it. she will call with any problems that may develop before the next visit here. Total time spent: 30 mins including face to face time and time spent for planning, charting and co-ordination of care   Suzzette Righter, [place] 12/12/21    I Gardiner Coins am scribing for Dr. Lindi Adie  ***

## 2021-12-14 ENCOUNTER — Inpatient Hospital Stay: Payer: Medicare PPO | Attending: Hematology and Oncology | Admitting: Hematology and Oncology

## 2021-12-14 ENCOUNTER — Other Ambulatory Visit: Payer: Self-pay

## 2021-12-14 VITALS — BP 159/80 | HR 65 | Temp 97.7°F | Resp 18 | Ht 65.0 in | Wt 251.3 lb

## 2021-12-14 DIAGNOSIS — N6452 Nipple discharge: Secondary | ICD-10-CM | POA: Diagnosis not present

## 2021-12-14 DIAGNOSIS — Z79811 Long term (current) use of aromatase inhibitors: Secondary | ICD-10-CM | POA: Insufficient documentation

## 2021-12-14 DIAGNOSIS — C50411 Malignant neoplasm of upper-outer quadrant of right female breast: Secondary | ICD-10-CM | POA: Insufficient documentation

## 2021-12-14 DIAGNOSIS — Z780 Asymptomatic menopausal state: Secondary | ICD-10-CM | POA: Diagnosis not present

## 2021-12-14 DIAGNOSIS — Z170 Estrogen receptor positive status [ER+]: Secondary | ICD-10-CM | POA: Insufficient documentation

## 2021-12-14 DIAGNOSIS — Z79899 Other long term (current) drug therapy: Secondary | ICD-10-CM | POA: Diagnosis not present

## 2021-12-14 MED ORDER — LETROZOLE 2.5 MG PO TABS: 2.5000 mg | ORAL_TABLET | Freq: Every day | ORAL | 3 refills | Status: DC

## 2021-12-14 NOTE — Assessment & Plan Note (Addendum)
01/25/2021:Right lumpectomy: Grade 1 invasive lobular cancer, 4 mm, LCIS, DCIS grade 1-2, margins positive, DCIS posterior margin positive, ER 100%, PR 100%, Ki-67 10%, HER2 2+ by IHC, FISH negative   Recommendations: 1.  Resection of the positive margins 02/15/2020: Benign 2. Adjuvant radiation therapy completed 04/19/2021 3. Adjuvant antiestrogen therapy started 03/15/2021 Genetic counseling  Letrozole toxicities: Mild hot flashes Joint aches and pains: Although she had joint pains even before starting treatment.  Breast cancer surveillance: Breast exam 12/14/2021: Benign Mammogram needs to be scheduled  Return to clinic in 1 year for follow-up

## 2021-12-15 ENCOUNTER — Telehealth: Payer: Self-pay | Admitting: *Deleted

## 2021-12-15 ENCOUNTER — Telehealth: Payer: Self-pay | Admitting: Hematology and Oncology

## 2021-12-15 NOTE — Telephone Encounter (Signed)
Scheduled appointment per 11/30 los. Left voicemail.

## 2021-12-15 NOTE — Telephone Encounter (Signed)
Received call from pt with complaint of ongoing weakness bilaterally x 1 week. Pt denies recent injury/ trauma/ or poor nutritional intake.  Per MD pt needing to f/u with PCP for further evaluation and tx. Pt educated and verbalized understanding.

## 2022-01-02 DIAGNOSIS — Z170 Estrogen receptor positive status [ER+]: Secondary | ICD-10-CM | POA: Diagnosis not present

## 2022-01-02 DIAGNOSIS — I890 Lymphedema, not elsewhere classified: Secondary | ICD-10-CM | POA: Diagnosis not present

## 2022-01-02 DIAGNOSIS — C50411 Malignant neoplasm of upper-outer quadrant of right female breast: Secondary | ICD-10-CM | POA: Diagnosis not present

## 2022-01-04 ENCOUNTER — Other Ambulatory Visit: Payer: Self-pay | Admitting: General Surgery

## 2022-01-04 DIAGNOSIS — I890 Lymphedema, not elsewhere classified: Secondary | ICD-10-CM

## 2022-01-30 ENCOUNTER — Ambulatory Visit
Admission: RE | Admit: 2022-01-30 | Discharge: 2022-01-30 | Disposition: A | Discharge status: Home / Self Care | Payer: Medicare PPO | Source: Ambulatory Visit | Attending: General Surgery | Admitting: General Surgery

## 2022-01-30 DIAGNOSIS — N6341 Unspecified lump in right breast, subareolar: Secondary | ICD-10-CM | POA: Diagnosis not present

## 2022-01-30 DIAGNOSIS — Z853 Personal history of malignant neoplasm of breast: Secondary | ICD-10-CM | POA: Diagnosis not present

## 2022-01-30 DIAGNOSIS — I890 Lymphedema, not elsewhere classified: Secondary | ICD-10-CM

## 2022-01-30 DIAGNOSIS — N6489 Other specified disorders of breast: Secondary | ICD-10-CM | POA: Diagnosis not present

## 2022-01-30 DIAGNOSIS — R928 Other abnormal and inconclusive findings on diagnostic imaging of breast: Secondary | ICD-10-CM | POA: Diagnosis not present

## 2022-01-30 HISTORY — DX: Personal history of irradiation: Z92.3

## 2022-01-31 ENCOUNTER — Other Ambulatory Visit: Payer: Self-pay | Admitting: General Surgery

## 2022-01-31 DIAGNOSIS — N6310 Unspecified lump in the right breast, unspecified quadrant: Secondary | ICD-10-CM

## 2022-02-05 DIAGNOSIS — M797 Fibromyalgia: Secondary | ICD-10-CM | POA: Diagnosis not present

## 2022-02-05 DIAGNOSIS — I10 Essential (primary) hypertension: Secondary | ICD-10-CM | POA: Diagnosis not present

## 2022-02-05 DIAGNOSIS — Z853 Personal history of malignant neoplasm of breast: Secondary | ICD-10-CM | POA: Diagnosis not present

## 2022-02-13 NOTE — Therapy (Signed)
OUTPATIENT PHYSICAL THERAPY ONCOLOGY EVALUATION  Patient Name: Vickie Collier MRN: [id number] DOB:1950/11/20, 72 y.o., female Today's Date: 02/14/2022  END OF SESSION:  PT End of Session - 02/14/22 1656     Visit Number 1    Number of Visits 12    Date for PT Re-Evaluation 03/28/22    PT Start Time 1605    PT Stop Time 0263    PT Time Calculation (min) 45 min    Activity Tolerance Patient tolerated treatment well    Behavior During Therapy [facility] for tasks assessed/performed             Past Medical History:  Diagnosis Date   Arthritis    Atypical ductal hyperplasia of right breast    Breast cancer ([place]) 01/25/2021   Family history of ovarian cancer    Family history of pancreatic cancer    Family history of prostate cancer    Family history of stomach cancer    GERD (gastroesophageal reflux disease)    Hyperlipidemia    Hypertension    Personal history of radiation therapy    Past Surgical History:  Procedure Laterality Date   ABDOMINAL HYSTERECTOMY     BREAST LUMPECTOMY     JOINT REPLACEMENT Bilateral    TKR   RADIOACTIVE SEED GUIDED EXCISIONAL BREAST BIOPSY Right 01/25/2021   Procedure: RADIOACTIVE SEED GUIDED EXCISIONAL RIGHT BREAST BIOPSY;  Surgeon: Stark Klein, MD;  Location: [place];  Service: General;  Laterality: Right;   RE-EXCISION OF BREAST LUMPECTOMY Right 02/14/2021   Procedure: RE-EXCISION OF RIGHT BREAST LUMPECTOMY;  Surgeon: Stark Klein, MD;  Location: [place];  Service: General;  Laterality: Right;   Patient Active Problem List   Diagnosis Date Noted   Genetic testing 04/03/2021   Family history of prostate cancer 03/22/2021   Family history of ovarian cancer 03/22/2021   Family history of pancreatic cancer 03/22/2021   Family history of stomach cancer 03/22/2021   Malignant neoplasm of upper-outer quadrant of right breast in female, estrogen receptor positive ([place]) 02/16/2021    PCP: Herma Carson bland,  MD  REFERRING PROVIDER: Dr. Stark Klein  REFERRING DIAG: Right Breast Lymphedema s/p right Lumpectomy  THERAPY DIAG:  Malignant neoplasm of upper-outer quadrant of right breast in female, estrogen receptor positive ([place])  Abnormal posture  Lymphedema of breast  ONSET DATE: 4 months ago  Rationale for Evaluation and Treatment: Rehabilitation  SUBJECTIVE:  SUBJECTIVE STATEMENT:  My right breast is draining at the nipple for about 3-4 months.  I had an [place] and they drew 7 syringes of fluid off a seroma but it wasn't infected. I had another [place] and they tried to get some more drainage but she wasn't able to. It was behind my nipple and they didn't want to go that deep. I wore the compression bra yesterday but I have a regular bra on today. It isn't draining as much today. Pt was attended today by her daughter Deirdre.  PERTINENT HISTORY:  Pt had an abnormal right mammogram and pt underwent seed localized excisional biopsy 01/25/21. The pathology was positive for cancer and margins were not clear so she underwent a re-excision on 02/14/2021. She had radiation which ended on 04/19/2021. 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 No LN's were removed. It was ER=, PR +, and HER 2 -. Pt ambulates with a rollator with left leg outside rollator. She walks with stiff knees from prior TKA's, She does not flex well at the hips and has difficulty sitting erect. She requires assist to get up and down but family will help. She thinks she can get on the table with head of table elevated    PAIN:  Are you having pain? Nonot now, but occasionally a little tenderness  PRECAUTIONS: Right UE lymphedema risk, Right breast lymphedema,bilateral TKA, unable to sit erect, uses hands behind her, Bilateral shoulder  OA  WEIGHT BEARING RESTRICTIONS: No, but uses a rollator and requires help with sit to stand. Knees don't bend well, nor do her hips    FALLS:  Has patient fallen in last 6 months? No  LIVING ENVIRONMENT: Lives with: grandaughter and her  4 grand children Lives in: House/apartment Stairs: Yes; Internal: 13 steps; none Has following equipment at home: Walker - 4 wheeled and lift chair  OCCUPATION: retired  LEISURE: family  HAND DOMINANCE: right   PRIOR LEVEL OF FUNCTION: Independent with household mobility with device, needs help with socks,uses lift chair to get up, help to bath legs   PATIENT GOALS: ant to get breast better and to stop draining   OBJECTIVE:  COGNITION: Overall cognitive status: Within functional limits for tasks assessed   PALPATION: Area of induration noted at lateral breast with defined borders. No tenderness per pt  OBSERVATIONS / OTHER ASSESSMENTS: hyperpigmentation noted right breast secondary to radiation. Right breast drawn up from radiation. Swelling noted at lateral breast with induration noted  around lateral breast near incision. Small amount of drainage from right nipple. No significantly enlarged pores  SENSATION: Light touch: Appears intact   POSTURE: forward head, rounded shoulders,   UPPER EXTREMITY AROM/PROM:  A/PROM RIGHT   eval   Shoulder extension   Shoulder flexion   Shoulder abduction   Shoulder internal rotation   Shoulder external rotation     (Blank rows = not tested)  A/PROM LEFT   eval  Shoulder extension   Shoulder flexion   Shoulder abduction   Shoulder internal rotation   Shoulder external rotation     (Blank rows = not tested)  CERVICAL AROM: All within funcitonal limits:        LYMPHEDEMA ASSESSMENTS:   SURGERY TYPE/DATE: 01/25/2021 excisional biopsy Right breast followed by reexcision on 02/14/2021 ,   NUMBER OF LYMPH NODES REMOVED: 0  CHEMOTHERAPY: NO  RADIATION:YES, final  04/19/2021  HORMONE TREATMENT: YES  INFECTIONS: NO  BREAST COMPLAINTS QUESTIONNAIRE Pain:0 Heaviness:0 Swollen feeling:2 Tense Skin:0 Redness:0  Bra Print:0 Size of Pores:0 Hard feeling: 6 Total:     8/80 A Score over 9 indicates lymphedema issues in the breast  TODAY'S TREATMENT:                                                                                                                                         DATE: 02/14/2022  PATIENT EDUCATION:  Education details: importance of compression to help seroma area drain, how to use foam pads that were made for lower border of bra and over indurated area Person educated: Patient and her daughter Education method: Explanation Education comprehension: verbalized understanding  HOME EXERCISE PROGRAM: None given  ASSESSMENT:  CLINICAL IMPRESSION: Patient is a 72 y.o. female who was seen today for physical therapy evaluation and treatment for Right breast swelling. She was attended by her daughter  She has very poor mobility and requires assistance to get up and down. She is using a rollator. Her knees dont bend well from prior TKA's, and she has advanced bilateral shoulder arthritis. Her right breast continues with hyperpigmentation from radiation and a large area of induration at the lateral breast with fairly well defined borders. There is a small amount of drainage from the nipple and pt is wearing a pad in her bra.  Pores are minimally enlarged and lymphedema for which she was previously treated is much better. MD reports the area is a seroma, and she has had 7 syringes of fluid drawn off in October. It is smaller, but still visualized with [place].It was emphasized that she wear her compression bra with absorbent pad for drainage, and place foam pads covered in TG soft in bottom of bra to prevent rolling. Another foam pad was made to place over area of induration but must be worn in compression bra to encourage drainage.  She will wear  compression and foam for a week and will return for reassessment. She is advised to keep the area clean and always watch for redness to be sure she isn't getting infection. Will initiate MLD  to the right breast next week if indicated . She will benefit from skilled PT to address right breast swelling and drainage for return to normal .    OBJECTIVE IMPAIRMENTS: .decreased ROM, decreased strength, increased edema, impaired UE functional use, postural dysfunction, obesity,    ACTIVITY LIMITATIONS: carrying, bending, sitting, standing, bathing, reach over head, and locomotion level  PARTICIPATION LIMITATIONS: meal prep, cleaning, laundry, driving, shopping, and community activity  PERSONAL FACTORS: Age and 3+ comorbidities: Right breast Cancer s/p radiation, right breast swelling with drainage, Bilateral TKA that do not bend well, bilateral shoulder OA, poor overall mobility  are also affecting patient's functional outcome  are also affecting patient's functional outcome.   REHAB POTENTIAL: Good  CLINICAL DECISION MAKING: Stable/uncomplicated  EVALUATION COMPLEXITY: Low  GOALS: Goals reviewed with patient? Yes  LONG TERM GOALS                           1. Pt will report decreased breast swelling/induration at lateral breast  by 50% Goal Status: INITIAL   2. Pts. Right breast will no longer have drainage from nipple area   Baseline:still draining    Goal status:INITIAL  PLAN:  PT FREQUENCY: 1-2x/week  PT DURATION: 6 weeks  PLANNED INTERVENTIONS: Therapeutic exercises, Therapeutic activity, Patient/Family education, Self Care, Joint mobilization, Manual lymph drainage, Vasopneumatic device, Manual therapy, and Re-evaluation  PLAN FOR NEXT SESSION:  Reassess area of induration at right lateral breast and nipple drainage, initiate right breast MLD. Have family assist pt onto table if needed. PROM prn (has severe OA)    Claris Pong, PT 02/14/2022, 4:57  PM

## 2022-02-14 ENCOUNTER — Ambulatory Visit: Payer: Medicare PPO | Attending: General Surgery

## 2022-02-14 ENCOUNTER — Other Ambulatory Visit: Payer: Self-pay

## 2022-02-14 DIAGNOSIS — C50411 Malignant neoplasm of upper-outer quadrant of right female breast: Secondary | ICD-10-CM | POA: Insufficient documentation

## 2022-02-14 DIAGNOSIS — Z170 Estrogen receptor positive status [ER+]: Secondary | ICD-10-CM | POA: Diagnosis not present

## 2022-02-14 DIAGNOSIS — N6489 Other specified disorders of breast: Secondary | ICD-10-CM | POA: Diagnosis not present

## 2022-02-14 DIAGNOSIS — I890 Lymphedema, not elsewhere classified: Secondary | ICD-10-CM | POA: Diagnosis not present

## 2022-02-14 DIAGNOSIS — R293 Abnormal posture: Secondary | ICD-10-CM | POA: Diagnosis not present

## 2022-02-19 DIAGNOSIS — I10 Essential (primary) hypertension: Secondary | ICD-10-CM | POA: Diagnosis not present

## 2022-03-06 ENCOUNTER — Ambulatory Visit: Payer: Medicare PPO | Attending: General Surgery

## 2022-03-06 DIAGNOSIS — N6489 Other specified disorders of breast: Secondary | ICD-10-CM | POA: Diagnosis not present

## 2022-03-06 DIAGNOSIS — I890 Lymphedema, not elsewhere classified: Secondary | ICD-10-CM | POA: Insufficient documentation

## 2022-03-06 DIAGNOSIS — Z170 Estrogen receptor positive status [ER+]: Secondary | ICD-10-CM | POA: Insufficient documentation

## 2022-03-06 DIAGNOSIS — R600 Localized edema: Secondary | ICD-10-CM | POA: Insufficient documentation

## 2022-03-06 DIAGNOSIS — R293 Abnormal posture: Secondary | ICD-10-CM | POA: Insufficient documentation

## 2022-03-06 DIAGNOSIS — C50411 Malignant neoplasm of upper-outer quadrant of right female breast: Secondary | ICD-10-CM | POA: Insufficient documentation

## 2022-03-06 NOTE — Therapy (Signed)
OUTPATIENT PHYSICAL THERAPY ONCOLOGY EVALUATION  Patient Name: Vickie Collier MRN: [id number] DOB:[birth year], 72 y.o., female Today's Date: 03/06/2022  END OF SESSION:  PT End of Session - 03/06/22 1659     Visit Number 2    Number of Visits 12    Date for PT Re-Evaluation 03/28/22    Authorization - Visit Number 2    Authorization - Number of Visits 12    PT Start Time 1602    PT Stop Time [id number]    PT Time Calculation (min) 48 min    Activity Tolerance Patient tolerated treatment well    Behavior During Therapy [facility] for tasks assessed/performed             Past Medical History:  Diagnosis Date   Arthritis    Atypical ductal hyperplasia of right breast    Breast cancer ([place]) 01/25/2021   Family history of ovarian cancer    Family history of pancreatic cancer    Family history of prostate cancer    Family history of stomach cancer    GERD (gastroesophageal reflux disease)    Hyperlipidemia    Hypertension    Personal history of radiation therapy    Past Surgical History:  Procedure Laterality Date   ABDOMINAL HYSTERECTOMY     BREAST LUMPECTOMY     JOINT REPLACEMENT Bilateral    TKR   RADIOACTIVE SEED GUIDED EXCISIONAL BREAST BIOPSY Right 01/25/2021   Procedure: RADIOACTIVE SEED GUIDED EXCISIONAL RIGHT BREAST BIOPSY;  Surgeon: Stark Klein, MD;  Location: [place];  Service: General;  Laterality: Right;   RE-EXCISION OF BREAST LUMPECTOMY Right 02/14/2021   Procedure: RE-EXCISION OF RIGHT BREAST LUMPECTOMY;  Surgeon: Stark Klein, MD;  Location: [place];  Service: General;  Laterality: Right;   Patient Active Problem List   Diagnosis Date Noted   Genetic testing 04/03/2021   Family history of prostate cancer 03/22/2021   Family history of ovarian cancer 03/22/2021   Family history of pancreatic cancer 03/22/2021   Family history of stomach cancer 03/22/2021   Malignant neoplasm of upper-outer quadrant of right breast in  female, estrogen receptor positive ([place]) 02/16/2021    PCP: Herma Carson bland, MD  REFERRING PROVIDER: Dr. Stark Klein  REFERRING DIAG: Right Breast Lymphedema s/p right Lumpectomy  THERAPY DIAG:  Malignant neoplasm of upper-outer quadrant of right breast in female, estrogen receptor positive ([place])  Abnormal posture  Lymphedema of breast  Seroma of breast  Localized edema  ONSET DATE: 4 months ago  Rationale for Evaluation and Treatment: Rehabilitation  SUBJECTIVE:  SUBJECTIVE STATEMENT: It doesn't seem to be leaking as much as it did. It still leaks some. I have been wearing the compression bra and the foam consistently.  It feels a little softer where it was so hard.   PERTINENT HISTORY:  Pt had an abnormal right mammogram and pt underwent seed localized excisional biopsy 01/25/21. The pathology was positive for cancer and margins were not clear so she underwent a re-excision on 02/14/2021. She had radiation which ended on 04/19/2021. 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 No LN's were removed. It was ER=, PR +, and HER 2 -. Pt ambulates with a rollator with left leg outside rollator. She walks with stiff knees from prior TKA's, She does not flex well at the hips and has difficulty sitting erect. She requires assist to get up and down but family will help. She thinks she can get on the table with head of table elevated    PAIN:  Are you having pain? Nonot now, but occasionally a little tenderness  PRECAUTIONS: Right UE lymphedema risk, Right breast lymphedema,bilateral TKA, unable to sit erect, uses hands behind her, Bilateral shoulder OA  WEIGHT BEARING RESTRICTIONS: No, but uses a rollator and requires help with sit to stand. Knees don't bend well, nor do her hips    FALLS:   Has patient fallen in last 6 months? No  LIVING ENVIRONMENT: Lives with: grandaughter and her  4 grand children Lives in: House/apartment Stairs: Yes; Internal: 13 steps; none Has following equipment at home: Walker - 4 wheeled and lift chair  OCCUPATION: retired  LEISURE: family  HAND DOMINANCE: right   PRIOR LEVEL OF FUNCTION: Independent with household mobility with device, needs help with socks,uses lift chair to get up, help to bath legs   PATIENT GOALS: ant to get breast better and to stop draining   OBJECTIVE:  COGNITION: Overall cognitive status: Within functional limits for tasks assessed   PALPATION: Area of induration noted at lateral breast with defined borders. No tenderness per pt  OBSERVATIONS / OTHER ASSESSMENTS: hyperpigmentation noted right breast secondary to radiation. Right breast drawn up from radiation. Swelling noted at lateral breast with induration noted  around lateral breast near incision. Small amount of drainage from right nipple. No significantly enlarged pores  SENSATION: Light touch: Appears intact   POSTURE: forward head, rounded shoulders,   UPPER EXTREMITY AROM/PROM:  A/PROM RIGHT   eval   Shoulder extension   Shoulder flexion   Shoulder abduction   Shoulder internal rotation   Shoulder external rotation     (Blank rows = not tested)  A/PROM LEFT   eval  Shoulder extension   Shoulder flexion   Shoulder abduction   Shoulder internal rotation   Shoulder external rotation     (Blank rows = not tested)  CERVICAL AROM: All within funcitonal limits:        LYMPHEDEMA ASSESSMENTS:   SURGERY TYPE/DATE: 01/25/2021 excisional biopsy Right breast followed by reexcision on 02/14/2021 ,   NUMBER OF LYMPH NODES REMOVED: 0  CHEMOTHERAPY: NO  RADIATION:YES, final 04/19/2021  HORMONE TREATMENT: YES  INFECTIONS: NO  BREAST COMPLAINTS QUESTIONNAIRE Pain:0 Heaviness:0 Swollen feeling:2 Tense Skin:0 Redness:0 Bra  Print:0 Size of Pores:0 Hard feeling: 6 Total:     8/80 A Score over 9 indicates lymphedema issues in the breast  TODAY'S TREATMENT  DATE:   03/06/2022 Pt assisted by PT onto table;pt requires max assist of PT to get legs onto table Therapist assessed right breast with pt permission. No sign of drainage today. Firmness still noted at lateral breast incision but non tender Soft tissue mobilization to area of induration and scar mobilization to lateral breast scar prior to MLD techniques In supine: Short neck, 5 diaphragmatic breaths, L axillary nodes and establishment of interaxillary pathway, R inguinal nodes and establishment of axilloinguinal pathway, then R breast moving fluid towards pathways spending extra time in any areas of fibrosis then retracing all steps. Assisted pt with donning bra and getting off of table  02/14/2022  PATIENT EDUCATION:  Education details: importance of compression to help seroma area drain, how to use foam pads that were made for lower border of bra and over indurated area Person educated: Patient and her daughter Education method: Explanation Education comprehension: verbalized understanding  HOME EXERCISE PROGRAM: None given  ASSESSMENT:  CLINICAL IMPRESSION: Pt has been compliant with wearing her compression bra and foam pads that were made for her. Pt continues with induration at the lateral breast and borders can still be palpated but does feel somewhat softer in places. There is no visible drainage from the nipple area. Pt was advised to continue soft tissue mobilization to areas of induration/scar. She will return in  a week to 10 days. She may be ready for release at that time.  OBJECTIVE IMPAIRMENTS: .decreased ROM, decreased strength, increased edema, impaired UE functional use, postural dysfunction, obesity,     ACTIVITY LIMITATIONS: carrying, bending, sitting, standing, bathing, reach over head, and locomotion level  PARTICIPATION LIMITATIONS: meal prep, cleaning, laundry, driving, shopping, and community activity  PERSONAL FACTORS: Age and 3+ comorbidities: Right breast Cancer s/p radiation, right breast swelling with drainage, Bilateral TKA that do not bend well, bilateral shoulder OA, poor overall mobility  are also affecting patient's functional outcome  are also affecting patient's functional outcome.   REHAB POTENTIAL: Good  CLINICAL DECISION MAKING: Stable/uncomplicated  EVALUATION COMPLEXITY: Low  GOALS: Goals reviewed with patient? Yes                            LONG TERM GOALS                           1. Pt will report decreased breast swelling/induration at lateral breast  by 50% Goal Status: INITIAL   2. Pts. Right breast will no longer have drainage from nipple area   Baseline:still draining    Goal status:INITIAL  PLAN:  PT FREQUENCY: 1-2x/week  PT DURATION: 6 weeks  PLANNED INTERVENTIONS: Therapeutic exercises, Therapeutic activity, Patient/Family education, Self Care, Joint mobilization, Manual lymph drainage, Vasopneumatic device, Manual therapy, and Re-evaluation  PLAN FOR NEXT SESSION:  Reassess area of induration at right lateral breast and nipple drainage, initiate right breast MLD. Have family assist pt onto table if needed. PROM prn (has severe OA)    Claris Pong, PT 03/06/2022, 5:10 PM

## 2022-03-21 ENCOUNTER — Ambulatory Visit: Payer: Medicare PPO | Attending: General Surgery

## 2022-03-21 DIAGNOSIS — C50411 Malignant neoplasm of upper-outer quadrant of right female breast: Secondary | ICD-10-CM | POA: Insufficient documentation

## 2022-03-21 DIAGNOSIS — Z170 Estrogen receptor positive status [ER+]: Secondary | ICD-10-CM | POA: Diagnosis not present

## 2022-03-21 DIAGNOSIS — N6489 Other specified disorders of breast: Secondary | ICD-10-CM | POA: Diagnosis not present

## 2022-03-21 DIAGNOSIS — I890 Lymphedema, not elsewhere classified: Secondary | ICD-10-CM | POA: Diagnosis not present

## 2022-03-21 DIAGNOSIS — R293 Abnormal posture: Secondary | ICD-10-CM | POA: Diagnosis not present

## 2022-03-21 NOTE — Therapy (Signed)
OUTPATIENT PHYSICAL THERAPY ONCOLOGY EVALUATION  Patient Name: Vickie Collier MRN: [id number] DOB:02-21-1950, 72 y.o., female Today's Date: 03/21/2022  END OF SESSION:  PT End of Session - 03/21/22 1458     Visit Number 3    Number of Visits 12    Date for PT Re-Evaluation 03/28/22    Authorization - Visit Number 3    Authorization - Number of Visits 12    PT Start Time 1500    PT Stop Time [id number]    PT Time Calculation (min) 52 min    Activity Tolerance Patient tolerated treatment well    Behavior During Therapy WFL for tasks assessed/performed             Past Medical History:  Diagnosis Date   Arthritis    Atypical ductal hyperplasia of right breast    Breast cancer ([place]) 01/25/2021   Family history of ovarian cancer    Family history of pancreatic cancer    Family history of prostate cancer    Family history of stomach cancer    GERD (gastroesophageal reflux disease)    Hyperlipidemia    Hypertension    Personal history of radiation therapy    Past Surgical History:  Procedure Laterality Date   ABDOMINAL HYSTERECTOMY     BREAST LUMPECTOMY     JOINT REPLACEMENT Bilateral    TKR   RADIOACTIVE SEED GUIDED EXCISIONAL BREAST BIOPSY Right 01/25/2021   Procedure: RADIOACTIVE SEED GUIDED EXCISIONAL RIGHT BREAST BIOPSY;  Surgeon: Stark Klein, MD;  Location: [facility];  Service: General;  Laterality: Right;   RE-EXCISION OF BREAST LUMPECTOMY Right 02/14/2021   Procedure: RE-EXCISION OF RIGHT BREAST LUMPECTOMY;  Surgeon: Stark Klein, MD;  Location: [facility];  Service: General;  Laterality: Right;   Patient Active Problem List   Diagnosis Date Noted   Genetic testing 04/03/2021   Family history of prostate cancer 03/22/2021   Family history of ovarian cancer 03/22/2021   Family history of pancreatic cancer 03/22/2021   Family history of stomach cancer 03/22/2021   Malignant neoplasm of upper-outer quadrant of right breast in  female, estrogen receptor positive ([place]) 02/16/2021    PCP: Herma Carson bland, MD  REFERRING PROVIDER: Dr. Stark Klein  REFERRING DIAG: Right Breast Lymphedema s/p right Lumpectomy  THERAPY DIAG:  Malignant neoplasm of upper-outer quadrant of right breast in female, estrogen receptor positive ([place])  Abnormal posture  Lymphedema of breast  Seroma of breast  ONSET DATE: 4 months ago  Rationale for Evaluation and Treatment: Rehabilitation  SUBJECTIVE:  SUBJECTIVE STATEMENT:  I havent had any drainage in about a week and a half. The breast is a little hard in some places. No pain in a while. I have tried to do some of the MLD  PERTINENT HISTORY:  Pt had an abnormal right mammogram and pt underwent seed localized excisional biopsy 01/25/21. The pathology was positive for cancer and margins were not clear so she underwent a re-excision on 02/14/2021. She had radiation which ended on 04/19/2021. 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 No LN's were removed. It was ER=, PR +, and HER 2 -. Pt ambulates with a rollator with left leg outside rollator. She walks with stiff knees from prior TKA's, She does not flex well at the hips and has difficulty sitting erect. She requires assist to get up and down but family will help. She thinks she can get on the table with head of table elevated    PAIN:  Are you having pain? No pain or tenderness  PRECAUTIONS: Right UE lymphedema risk, Right breast lymphedema,bilateral TKA, unable to sit erect, uses hands behind her, Bilateral shoulder OA  WEIGHT BEARING RESTRICTIONS: No, but uses a rollator and requires help with sit to stand. Knees don't bend well, nor do her hips    FALLS:  Has patient fallen in last 6 months? No  LIVING ENVIRONMENT: Lives with:  grandaughter and her  4 grand children Lives in: House/apartment Stairs: Yes; Internal: 13 steps; none Has following equipment at home: Walker - 4 wheeled and lift chair  OCCUPATION: retired  LEISURE: family  HAND DOMINANCE: right   PRIOR LEVEL OF FUNCTION: Independent with household mobility with device, needs help with socks,uses lift chair to get up, help to bath legs   PATIENT GOALS: ant to get breast better and to stop draining   OBJECTIVE:  COGNITION: Overall cognitive status: Within functional limits for tasks assessed   PALPATION: Area of induration noted at lateral breast with defined borders. No tenderness per pt  OBSERVATIONS / OTHER ASSESSMENTS: hyperpigmentation noted right breast secondary to radiation. Right breast drawn up from radiation. Swelling noted at lateral breast with induration noted  around lateral breast near incision. Small amount of drainage from right nipple. No significantly enlarged pores  SENSATION: Light touch: Appears intact   POSTURE: forward head, rounded shoulders,   UPPER EXTREMITY AROM/PROM:  A/PROM RIGHT   eval   Shoulder extension   Shoulder flexion   Shoulder abduction   Shoulder internal rotation   Shoulder external rotation     (Blank rows = not tested)  A/PROM LEFT   eval  Shoulder extension   Shoulder flexion   Shoulder abduction   Shoulder internal rotation   Shoulder external rotation     (Blank rows = not tested)  CERVICAL AROM: All within funcitonal limits:        LYMPHEDEMA ASSESSMENTS:   SURGERY TYPE/DATE: 01/25/2021 excisional biopsy Right breast followed by reexcision on 02/14/2021 ,   NUMBER OF LYMPH NODES REMOVED: 0  CHEMOTHERAPY: NO  RADIATION:YES, final 04/19/2021  HORMONE TREATMENT: YES  INFECTIONS: NO  BREAST COMPLAINTS QUESTIONNAIRE Pain:0 Heaviness:0 Swollen feeling:2 Tense Skin:0 Redness:0 Bra Print:0 Size of Pores:0 Hard feeling: 6 Total:     8/80 A Score over 9 indicates  lymphedema issues in the breast  TODAY'S TREATMENT  DATE:   03/21/2022 Pt assisted with gown and with getting legs up on table and putting bolster under Therapist assessed right breast with pt permission. No sign of drainage today. Firmness still noted at lateral breast incision but non tender Soft tissue mobilization to area of induration and scar mobilization to lateral breast scar prior to MLD techniques In supine: Short neck, 5 diaphragmatic breaths, L axillary nodes and establishment of interaxillary pathway, R inguinal nodes and establishment of axilloinguinal pathway, then R breast moving fluid towards pathways spending extra time in any areas of fibrosis then retracing all steps. Assessed goals;pt has achieved both Assisted pt with donning bra and getting off of table   03/06/2022 Pt assisted by PT onto table;pt requires max assist of PT to get legs onto table Therapist assessed right breast with pt permission. No sign of drainage today. Firmness still noted at lateral breast incision but non tender Soft tissue mobilization to area of induration and scar mobilization to lateral breast scar prior to MLD techniques In supine: Short neck, 5 diaphragmatic breaths, L axillary nodes and establishment of interaxillary pathway, R inguinal nodes and establishment of axilloinguinal pathway, then R breast moving fluid towards pathways spending extra time in any areas of fibrosis then retracing all steps. Assisted pt with donning bra and getting off of table  02/14/2022  PATIENT EDUCATION:  Education details: importance of compression to help seroma area drain, how to use foam pads that were made for lower border of bra and over indurated area Person educated: Patient and her daughter Education method: Explanation Education comprehension: verbalized  understanding  HOME EXERCISE PROGRAM: None given  ASSESSMENT:  CLINICAL IMPRESSION: Pt has been compliant with wearing her compression bra and foam pads that were made for her. She has no complaints of pain, and has had no drainage in approx. 10 days. She notes firmness/swelling atleast 50% improved.  There was no tenderness today and no fluid able to be expressed. Lateral breast is still mildly indurated, but not like it was. She has achieved her goals, and feels ready to be discharged.   OBJECTIVE IMPAIRMENTS: .decreased ROM, decreased strength, increased edema, impaired UE functional use, postural dysfunction, obesity,    ACTIVITY LIMITATIONS: carrying, bending, sitting, standing, bathing, reach over head, and locomotion level  PARTICIPATION LIMITATIONS: meal prep, cleaning, laundry, driving, shopping, and community activity  PERSONAL FACTORS: Age and 3+ comorbidities: Right breast Cancer s/p radiation, right breast swelling with drainage, Bilateral TKA that do not bend well, bilateral shoulder OA, poor overall mobility  are also affecting patient's functional outcome  are also affecting patient's functional outcome.   REHAB POTENTIAL: Good  CLINICAL DECISION MAKING: Stable/uncomplicated  EVALUATION COMPLEXITY: Low  GOALS: Goals reviewed with patient? Yes                            LONG TERM GOALS                           1. Pt will report decreased breast swelling/induration at lateral breast  by 50% Goal Status: MET 03/21/2022  2. Pts. Right breast will no longer have drainage from nipple area   Baseline:still draining    Goal status:MET 03/21/2022 PLAN:  PT FREQUENCY: 1-2x/week  PT DURATION: 6 weeks  PLANNED INTERVENTIONS: Therapeutic exercises, Therapeutic activity, Patient/Family education, Self Care, Joint mobilization, Manual lymph drainage, Vasopneumatic device, Manual therapy, and Re-evaluation  PLAN FOR NEXT  SESSION:  Pt discharged to independent self management     Claris Pong, PT 03/21/2022, 3:56 PM

## 2022-04-18 DIAGNOSIS — R7303 Prediabetes: Secondary | ICD-10-CM | POA: Diagnosis not present

## 2022-04-18 DIAGNOSIS — E782 Mixed hyperlipidemia: Secondary | ICD-10-CM | POA: Diagnosis not present

## 2022-04-18 DIAGNOSIS — Z6841 Body Mass Index (BMI) 40.0 and over, adult: Secondary | ICD-10-CM | POA: Diagnosis not present

## 2022-04-18 DIAGNOSIS — I10 Essential (primary) hypertension: Secondary | ICD-10-CM | POA: Diagnosis not present

## 2022-04-18 DIAGNOSIS — Z853 Personal history of malignant neoplasm of breast: Secondary | ICD-10-CM | POA: Diagnosis not present

## 2022-04-18 DIAGNOSIS — M797 Fibromyalgia: Secondary | ICD-10-CM | POA: Diagnosis not present

## 2022-04-18 DIAGNOSIS — E559 Vitamin D deficiency, unspecified: Secondary | ICD-10-CM | POA: Diagnosis not present

## 2022-05-11 DIAGNOSIS — Z1212 Encounter for screening for malignant neoplasm of rectum: Secondary | ICD-10-CM | POA: Diagnosis not present

## 2022-05-11 DIAGNOSIS — Z1211 Encounter for screening for malignant neoplasm of colon: Secondary | ICD-10-CM | POA: Diagnosis not present

## 2022-05-19 LAB — COLOGUARD: COLOGUARD: NEGATIVE

## 2022-05-25 DIAGNOSIS — I10 Essential (primary) hypertension: Secondary | ICD-10-CM | POA: Diagnosis not present

## 2022-07-02 DIAGNOSIS — C50411 Malignant neoplasm of upper-outer quadrant of right female breast: Secondary | ICD-10-CM | POA: Diagnosis not present

## 2022-07-02 DIAGNOSIS — I890 Lymphedema, not elsewhere classified: Secondary | ICD-10-CM | POA: Diagnosis not present

## 2022-07-02 DIAGNOSIS — N6091 Unspecified benign mammary dysplasia of right breast: Secondary | ICD-10-CM | POA: Diagnosis not present

## 2022-07-02 DIAGNOSIS — Z170 Estrogen receptor positive status [ER+]: Secondary | ICD-10-CM | POA: Diagnosis not present

## 2022-08-31 DIAGNOSIS — Z0001 Encounter for general adult medical examination with abnormal findings: Secondary | ICD-10-CM | POA: Diagnosis not present

## 2022-08-31 DIAGNOSIS — Z853 Personal history of malignant neoplasm of breast: Secondary | ICD-10-CM | POA: Diagnosis not present

## 2022-08-31 DIAGNOSIS — E782 Mixed hyperlipidemia: Secondary | ICD-10-CM | POA: Diagnosis not present

## 2022-08-31 DIAGNOSIS — M1380 Other specified arthritis, unspecified site: Secondary | ICD-10-CM | POA: Diagnosis not present

## 2022-08-31 DIAGNOSIS — M797 Fibromyalgia: Secondary | ICD-10-CM | POA: Diagnosis not present

## 2022-08-31 DIAGNOSIS — M62838 Other muscle spasm: Secondary | ICD-10-CM | POA: Diagnosis not present

## 2022-08-31 DIAGNOSIS — M1389 Other specified arthritis, multiple sites: Secondary | ICD-10-CM | POA: Diagnosis not present

## 2022-08-31 DIAGNOSIS — I10 Essential (primary) hypertension: Secondary | ICD-10-CM | POA: Diagnosis not present

## 2022-11-30 DIAGNOSIS — M1380 Other specified arthritis, unspecified site: Secondary | ICD-10-CM | POA: Diagnosis not present

## 2022-11-30 DIAGNOSIS — M13829 Other specified arthritis, unspecified elbow: Secondary | ICD-10-CM | POA: Diagnosis not present

## 2022-11-30 DIAGNOSIS — E782 Mixed hyperlipidemia: Secondary | ICD-10-CM | POA: Diagnosis not present

## 2022-11-30 DIAGNOSIS — I10 Essential (primary) hypertension: Secondary | ICD-10-CM | POA: Diagnosis not present

## 2022-11-30 DIAGNOSIS — M797 Fibromyalgia: Secondary | ICD-10-CM | POA: Diagnosis not present

## 2022-12-17 ENCOUNTER — Inpatient Hospital Stay: Payer: Medicare PPO | Attending: Hematology and Oncology | Admitting: Hematology and Oncology

## 2022-12-17 NOTE — Assessment & Plan Note (Signed)
01/25/2021:Right lumpectomy: Grade 1 invasive lobular cancer, 4 mm, LCIS, 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  Resection of the positive margins 02/15/2020: Benign 2. Adjuvant radiation therapy completed 04/19/2021 3. Adjuvant antiestrogen therapy started 03/15/2021 Genetic counseling   Letrozole toxicities: Mild hot flashes Joint aches and pains: Although she had joint pains even before starting treatment.   Breast cancer surveillance: Breast exam 12/17/2022: Benign Mammogram and ultrasound 01/30/2022: Retroareolar right breast heterogeneous collection 2.4 cm possibly seroma or scar tissue.  Decrease in the seroma right breast UOQ 4.2 cm   Nipple discharge: Patient had procedure done at [facility] to stop the nipple discharge but it is still mild and ongoing issue.   Return to clinic in 1 year for follow-up

## 2022-12-21 ENCOUNTER — Emergency Department (HOSPITAL_BASED_OUTPATIENT_CLINIC_OR_DEPARTMENT_OTHER): Payer: Medicare PPO

## 2022-12-21 ENCOUNTER — Emergency Department (HOSPITAL_BASED_OUTPATIENT_CLINIC_OR_DEPARTMENT_OTHER)
Admission: EM | Admit: 2022-12-21 | Discharge: 2022-12-21 | Disposition: A | Discharge status: Home / Self Care | Payer: Medicare PPO

## 2022-12-21 ENCOUNTER — Emergency Department (HOSPITAL_BASED_OUTPATIENT_CLINIC_OR_DEPARTMENT_OTHER): Payer: Medicare PPO | Admitting: Radiology

## 2022-12-21 ENCOUNTER — Other Ambulatory Visit: Payer: Self-pay

## 2022-12-21 DIAGNOSIS — W01198A Fall on same level from slipping, tripping and stumbling with subsequent striking against other object, initial encounter: Secondary | ICD-10-CM | POA: Diagnosis not present

## 2022-12-21 DIAGNOSIS — Z853 Personal history of malignant neoplasm of breast: Secondary | ICD-10-CM | POA: Diagnosis not present

## 2022-12-21 DIAGNOSIS — Z043 Encounter for examination and observation following other accident: Secondary | ICD-10-CM | POA: Diagnosis not present

## 2022-12-21 DIAGNOSIS — R109 Unspecified abdominal pain: Secondary | ICD-10-CM | POA: Insufficient documentation

## 2022-12-21 DIAGNOSIS — M1812 Unilateral primary osteoarthritis of first carpometacarpal joint, left hand: Secondary | ICD-10-CM | POA: Diagnosis not present

## 2022-12-21 DIAGNOSIS — S3993XA Unspecified injury of pelvis, initial encounter: Secondary | ICD-10-CM | POA: Diagnosis not present

## 2022-12-21 DIAGNOSIS — K5730 Diverticulosis of large intestine without perforation or abscess without bleeding: Secondary | ICD-10-CM | POA: Diagnosis not present

## 2022-12-21 DIAGNOSIS — M25552 Pain in left hip: Secondary | ICD-10-CM | POA: Diagnosis not present

## 2022-12-21 DIAGNOSIS — M79643 Pain in unspecified hand: Secondary | ICD-10-CM

## 2022-12-21 DIAGNOSIS — Z7982 Long term (current) use of aspirin: Secondary | ICD-10-CM | POA: Insufficient documentation

## 2022-12-21 DIAGNOSIS — M25532 Pain in left wrist: Secondary | ICD-10-CM | POA: Diagnosis not present

## 2022-12-21 DIAGNOSIS — K439 Ventral hernia without obstruction or gangrene: Secondary | ICD-10-CM | POA: Diagnosis not present

## 2022-12-21 DIAGNOSIS — W19XXXA Unspecified fall, initial encounter: Secondary | ICD-10-CM

## 2022-12-21 DIAGNOSIS — Z923 Personal history of irradiation: Secondary | ICD-10-CM | POA: Diagnosis not present

## 2022-12-21 DIAGNOSIS — I517 Cardiomegaly: Secondary | ICD-10-CM | POA: Diagnosis not present

## 2022-12-21 DIAGNOSIS — M160 Bilateral primary osteoarthritis of hip: Secondary | ICD-10-CM | POA: Diagnosis not present

## 2022-12-21 DIAGNOSIS — R928 Other abnormal and inconclusive findings on diagnostic imaging of breast: Secondary | ICD-10-CM | POA: Diagnosis not present

## 2022-12-21 DIAGNOSIS — S0990XA Unspecified injury of head, initial encounter: Secondary | ICD-10-CM | POA: Diagnosis not present

## 2022-12-21 MED ORDER — LIDOCAINE 4 % EX PTCH: 1.0000 | MEDICATED_PATCH | CUTANEOUS | 0 refills | Status: DC

## 2022-12-21 MED ORDER — LIDOCAINE 5 % EX PTCH
1.0000 | MEDICATED_PATCH | Freq: Once | CUTANEOUS | Status: DC
  Administered 2022-12-21: 1 via TRANSDERMAL
  Filled 2022-12-21: qty 1

## 2022-12-21 MED ORDER — OXYCODONE-ACETAMINOPHEN 5-325 MG PO TABS
1.0000 | ORAL_TABLET | Freq: Once | ORAL | Status: AC
  Administered 2022-12-21: 1 via ORAL
  Filled 2022-12-21: qty 1

## 2022-12-21 NOTE — ED Notes (Signed)
Patient ambulates at home with a walker. Pt provided with walker and ambulates with minimal assistance.

## 2022-12-21 NOTE — Discharge Instructions (Signed)
You may take over-the-counter pain medication such as Tylenol and ibuprofen.  We are prescribing a lidocaine patch.  Please follow-up with the orthopedic doctor regarding your thumb.  Please remain in the splint until you are seen by them.  Return immediately 12 fevers, chills, sudden onset headache, chest pain, shortness of breath, worsening abdominal pain or any new or worsening symptoms that are concerning to you.

## 2022-12-21 NOTE — ED Triage Notes (Signed)
Pt slipped away from walker twice today while walking outside, landing on butt on pavement in first fall and hitting face on ground in 2nd fall.  Pt reports mild nosebleed after 2nd fall and states some swelling in nose and left upper lip from fall.  Pt c/o pain in left leg and side since fall as well.  Denies LOC, no blood thinners.

## 2022-12-21 NOTE — ED Provider Notes (Signed)
[place] EMERGENCY DEPARTMENT AT [facility] Provider Note   CSN: [id number] Arrival date & time: 12/21/22  1657     History {Add pertinent medical, surgical, social history, OB history to HPI:1} Chief Complaint  Patient presents with   Vickie Collier is a 72 y.o. female.  72 year old female presenting emergency department for evaluation after a fall x 2 reports falling while leaving the house to go to an appointment.  Landed on her butt.  Then fell again after coming home tripped over the curb.  Hit her head.  No LOC.  Denies blood thinners.  Denies any chest pain, shortness of breath, abdominal pain, notes pain to her left hip area and flank.  Also complaining of pain to her left base of thumb/wrist   Fall       Home Medications Prior to Admission medications   Medication Sig Start Date End Date Taking? Authorizing Provider  amLODipine (NORVASC) 10 MG tablet Take 10 mg by mouth daily.    [provider]  aspirin EC 81 MG tablet Take 81 mg by mouth daily. Swallow whole.    [provider]  baclofen (LIORESAL) 10 MG tablet Take 10 mg by mouth 2 (two) times daily.    [provider]  cholecalciferol (VITAMIN D3) 25 MCG (1000 UNIT) tablet Take 1,000 Units by mouth daily.    [provider]  cyanocobalamin 100 MCG tablet Take 100 mcg by mouth daily.    [provider]  felodipine (PLENDIL) 5 MG 24 hr tablet Take 5 mg by mouth daily.    [provider]  letrozole (FEMARA) 2.5 MG tablet Take 1 tablet (2.5 mg total) by mouth daily. 12/14/21   Serena Croissant, MD  metoprolol succinate (TOPROL-XL) 50 MG 24 hr tablet Take 50 mg by mouth daily. Take with or immediately following a meal.    [provider]  pregabalin (LYRICA) 150 MG capsule Take 150 mg by mouth 2 (two) times daily.    [provider]  rosuvastatin (CRESTOR) 5 MG tablet Take 5 mg by mouth daily.    [provider]   valsartan-hydrochlorothiazide (DIOVAN-HCT) 80-12.5 MG tablet Take 1 tablet by mouth daily.    [provider]      Allergies    Lisinopril    Review of Systems   Review of Systems  Physical Exam Updated Vital Signs BP (!) 176/73   Pulse (!) 56   Temp 97.9 F (36.6 C) (Oral)   Resp 16   SpO2 100%  Physical Exam Vitals and nursing note reviewed.  Constitutional:      General: She is not in acute distress.    Appearance: She is obese. She is not toxic-appearing.  HENT:     Head: Normocephalic and atraumatic.     Nose: Nose normal.     Mouth/Throat:     Mouth: Mucous membranes are moist.  Eyes:     Conjunctiva/sclera: Conjunctivae normal.  Cardiovascular:     Rate and Rhythm: Normal rate and regular rhythm.  Pulmonary:     Effort: Pulmonary effort is normal.     Breath sounds: Normal breath sounds.  Abdominal:     General: Abdomen is flat. There is no distension.     Palpations: Abdomen is soft.     Tenderness: There is no abdominal tenderness. There is no guarding or rebound.  Musculoskeletal:     Cervical back: Neck supple. No tenderness.     Comments: Chest  wall stable nontender.  Left wrist with some tenderness to the anatomical snuffbox.  Pelvis with some minor tenderness to the left hip.  No other bony tenderness.  5 out of 5 plantarflexion dorsiflexion.  Bicep strength tricep take.  Skin:    General: Skin is warm and dry.     Capillary Refill: Capillary refill takes less than 2 seconds.  Neurological:     Mental Status: She is alert and oriented to person, place, and time.  Psychiatric:        Mood and Affect: Mood normal.        Behavior: Behavior normal.     ED Results / Procedures / Treatments   Labs (all labs ordered are listed, but only abnormal results are displayed) Labs Reviewed - No data to display  EKG None  Radiology CT PELVIS WO CONTRAST  Result Date: 12/21/2022 CLINICAL DATA:  Hip trauma, fracture suspected.  Multiple falls.  EXAM: CT PELVIS WITHOUT CONTRAST TECHNIQUE: Multidetector CT imaging of the pelvis was performed following the standard protocol without intravenous contrast. RADIATION DOSE REDUCTION: This exam was performed according to the departmental dose-optimization program which includes automated exposure control, adjustment of the mA and/or kV according to patient size and/or use of iterative reconstruction technique. COMPARISON:  12/21/2022. FINDINGS: Urinary Tract:  No abnormality visualized. Bowel: No evidence of bowel obstruction. Scattered diverticula are present along the colon without evidence of diverticulitis. Vascular/Lymphatic: Aortic atherosclerosis. No pelvic lymphadenopathy. Reproductive:  Status post hysterectomy.  No adnexal mass. Other: No free fluid in the pelvis. A fat containing umbilical hernia is noted. Multiple fat containing hernias are noted in the anterior abdominal wall. A small hernia containing a nonobstructed loop of small bowel is noted in the low anterior abdomen in the midline. Musculoskeletal: Subcutaneous fat stranding is noted over the lateral aspect of the left hip, likely contusion. Severe degenerative changes are noted at the hips bilaterally. No acute fracture or dislocation is seen. Degenerative changes are noted in the lower lumbar spine. IMPRESSION: 1. 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 No acute fracture or dislocation is seen. 2. Diverticulosis without diverticulitis. 3. Multifocal fat containing ventral abdominal wall hernias. There is a low anterior abdominal hernia in the midline containing nonobstructed small bowel. 4. Aortic atherosclerosis. Electronically Signed   By: Thornell Sartorius M.D.   On: 12/21/2022 20:34   DG Wrist Complete Left  Result Date: 12/21/2022 CLINICAL DATA:  Fall EXAM: LEFT WRIST - COMPLETE 3+ VIEW COMPARISON:  None Available. FINDINGS: No malalignment. Chondrocalcinosis. Moderate degenerative change at the first [place] joint. Question  deformity at the distal scaphoid. IMPRESSION: 1. Question deformity at the distal scaphoid, recommend correlation for snuffbox tenderness with follow-up CT if symptoms are suspicious for fracture 2. Chondrocalcinosis and degenerative change. Electronically Signed   By: Jasmine Pang M.D.   On: 12/21/2022 20:14   DG Pelvis 1-2 Views  Result Date: 12/21/2022 CLINICAL DATA:  Fall, left hip pain EXAM: PELVIS - 1-2 VIEW COMPARISON:  None Available. FINDINGS: Advanced degenerative changes in the hips bilaterally with complete joint space loss, osteophyte formation and subchondral sclerosis. No definite visible fracture, but fracture difficult to exclude given the extensive osteophytes. No subluxation or dislocation. IMPRESSION: Advanced osteoarthritis within the hips bilaterally. Extensive osteophytes makes confident exclusion of fracture difficult. No fracture seen, but if there is high clinical suspicion, CT may be helpful for further evaluation. Electronically Signed   By: Charlett Nose M.D.   On: 12/21/2022 19:43   DG Chest Portable  1 View  Result Date: 12/21/2022 CLINICAL DATA:  Fall EXAM: PORTABLE CHEST 1 VIEW COMPARISON:  None Available. FINDINGS: Cardiomegaly. No confluent opacities, effusions or edema. No acute bony abnormality. IMPRESSION: Cardiomegaly.  No active disease. Electronically Signed   By: Charlett Nose M.D.   On: 12/21/2022 19:41   CT Head Wo Contrast  Result Date: 12/21/2022 CLINICAL DATA:  Head trauma, minor (Age >= 65y) EXAM: CT HEAD WITHOUT CONTRAST TECHNIQUE: Contiguous axial images were obtained from the base of the skull through the vertex without intravenous contrast. RADIATION DOSE REDUCTION: This exam was performed according to the departmental dose-optimization program which includes automated exposure control, adjustment of the mA and/or kV according to patient size and/or use of iterative reconstruction technique. COMPARISON:  None Available. FINDINGS: Brain: No hemorrhage. No  hydrocephalus. No extra-axial fluid collection. No CT evidence of an acute cortical infarct. No mass effect. No mass lesion. Vascular: No hyperdense vessel or unexpected calcification. Skull: Normal. Negative for fracture or focal lesion. Sinuses/Orbits: No middle ear or mastoid effusion paranasal sinuses are clear. Orbits are unremarkable. Other: None. IMPRESSION: No CT evidence of intracranial injury. Electronically Signed   By: Lorenza Cambridge M.D.   On: 12/21/2022 19:30    Procedures Procedures  {Document cardiac monitor, telemetry assessment procedure when appropriate:1}  Medications Ordered in ED Medications  lidocaine (LIDODERM) 5 % 1 patch (1 patch Transdermal Patch Applied 12/21/22 2040)  oxyCODONE-acetaminophen (PERCOCET/ROXICET) 5-325 MG per tablet 1 tablet (1 tablet Oral Given 12/21/22 2040)    ED Course/ Medical Decision Making/ A&P   {   Click here for ABCD2, HEART and other calculatorsREFRESH Note before signing :1}                              Medical Decision Making 72 year old female to the emergency department after fall.  Per chart review does not appear to be taking blood thinner.  Does have advanced age.  Some tenderness to her left snuffbox into her left hip.  Placed in thumb spica.  X-ray of hip with inconclusive results.  Follow-up CT with no acute fracture.  CT head negative for acute intracranial pathology.  Considered labs, however vital signs reassuring and physical exam reassuring.  Low suspicion for acute intraabdominal/intrathoracic pathology, and sounds that this was a mechanical fall and not a syncopal type event.  Patient treated supportively with pain medications Percocet, lidocaine patch with improvement of her symptoms.  Able to ambulate with her walker which is at her baseline.  Stable for discharge at this time.  Amount and/or Complexity of Data Reviewed Radiology: ordered.  Risk Prescription drug management.   ***  {Document critical care time when  appropriate:1} {Document review of labs and clinical decision tools ie heart score, Chads2Vasc2 etc:1}  {Document your independent review of radiology images, and any outside records:1} {Document your discussion with family members, caretakers, and with consultants:1} {Document social determinants of health affecting pt's care:1} {Document your decision making why or why not admission, treatments were needed:1} Final Clinical Impression(s) / ED Diagnoses Final diagnoses:  None    Rx / DC Orders ED Discharge Orders     None

## 2022-12-24 ENCOUNTER — Encounter: Payer: Self-pay | Admitting: Hematology and Oncology

## 2023-01-10 ENCOUNTER — Telehealth: Payer: Self-pay | Admitting: Hematology and Oncology

## 2023-01-10 NOTE — Telephone Encounter (Signed)
Scheduled appointment per patients request via incoming call. Patient is aware of the made appointment.

## 2023-01-24 ENCOUNTER — Telehealth: Payer: Self-pay | Admitting: Hematology and Oncology

## 2023-01-24 NOTE — Telephone Encounter (Signed)
 Patient called to reschedule appt for 01/21.. Scheduled for the next ava appt.

## 2023-02-05 ENCOUNTER — Ambulatory Visit: Payer: Medicare PPO | Admitting: Hematology and Oncology

## 2023-02-16 ENCOUNTER — Other Ambulatory Visit: Payer: Self-pay | Admitting: Hematology and Oncology

## 2023-02-18 NOTE — Assessment & Plan Note (Signed)
01/25/2021:Right lumpectomy: Grade 1 invasive lobular cancer, 4 mm, LCIS, 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  Resection of the positive margins 02/15/2020: Benign 2. Adjuvant radiation therapy completed 04/19/2021 3. Adjuvant antiestrogen therapy started 03/15/2021 Genetic counseling   Letrozole toxicities: Mild hot flashes Joint aches and pains: Although she had joint pains even before starting treatment.   Breast cancer surveillance: Breast exam 02/19/2023: Benign Mammogram 12/21/2022 at [facility]: Benign breast density category 8   Nipple discharge: Patient had procedure done at [facility] to stop the nipple discharge but it is still mild and ongoing issue.   Return to clinic in 1 year for follow-up

## 2023-02-19 ENCOUNTER — Inpatient Hospital Stay: Payer: Medicare PPO | Attending: Hematology and Oncology | Admitting: Hematology and Oncology

## 2023-02-19 VITALS — BP 156/62 | HR 69 | Temp 99.0°F | Resp 20 | Wt 252.0 lb

## 2023-02-19 DIAGNOSIS — Z780 Asymptomatic menopausal state: Secondary | ICD-10-CM | POA: Diagnosis not present

## 2023-02-19 DIAGNOSIS — Z79899 Other long term (current) drug therapy: Secondary | ICD-10-CM | POA: Diagnosis not present

## 2023-02-19 DIAGNOSIS — Z1731 Human epidermal growth factor receptor 2 positive status: Secondary | ICD-10-CM | POA: Insufficient documentation

## 2023-02-19 DIAGNOSIS — Z79811 Long term (current) use of aromatase inhibitors: Secondary | ICD-10-CM | POA: Insufficient documentation

## 2023-02-19 DIAGNOSIS — Z170 Estrogen receptor positive status [ER+]: Secondary | ICD-10-CM

## 2023-02-19 DIAGNOSIS — Z993 Dependence on wheelchair: Secondary | ICD-10-CM | POA: Insufficient documentation

## 2023-02-19 DIAGNOSIS — C50411 Malignant neoplasm of upper-outer quadrant of right female breast: Secondary | ICD-10-CM | POA: Diagnosis not present

## 2023-02-19 DIAGNOSIS — Z1721 Progesterone receptor positive status: Secondary | ICD-10-CM | POA: Insufficient documentation

## 2023-02-19 MED ORDER — LETROZOLE 2.5 MG PO TABS: 2.5000 mg | ORAL_TABLET | Freq: Every day | ORAL | 3 refills | Status: AC

## 2023-02-19 NOTE — Progress Notes (Signed)
 Patient Care Team: Benjamine Aland, MD as PCP - General (Family Medicine) Odean Potts, MD as Consulting Physician (Hematology and Oncology) Dewey Rush, MD as Consulting Physician (Radiation Oncology) Aron Shoulders, MD as Consulting Physician (General Surgery)  DIAGNOSIS:  Encounter Diagnoses  Name Primary?   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, margins positive, DCIS posterior margin positive, ER 100%, PR 100%, Ki-67 10%, HER2 2+ by IHC, FISH negative   02/20/2021 Cancer Staging   Staging form: Breast, AJCC 8th Edition - Pathologic stage from 02/20/2021: Stage IA (pT1a, pN0, cM0, G1, ER+, PR+, HER2-) - Signed by Crawford Morna Pickle, NP on 05/19/2021 Stage prefix: Initial diagnosis Method of lymph node assessment: Clinical Multigene prognostic tests performed: None Histologic grading system: 3 grade system   03/22/2021 - 04/19/2021 Radiation Therapy   Site Technique Total Dose (Gy) Dose per Fx (Gy) Completed Fx Beam Energies  Breast, Right: Breast_R 3D 42.56/42.56 2.66 16/16 10X  Breast, Right: Breast_R_Bst 3D 10/10 2.5 4/4 10X     04/03/2021 Genetic Testing   Genetic testing reported through the CancerNext-Expanded+RNAinsight cancer panel found no pathogenic mutations.   The CancerNext-Expanded gene panel offered by [facility] and includes sequencing and rearrangement analysis for the following 77 genes: AIP, ALK, APC*, ATM*, AXIN2, BAP1, BARD1, BLM, BMPR1A, BRCA1*, BRCA2*, BRIP1*, CDC73, CDH1*, CDK4, CDKN1B, CDKN2A, CHEK2*, CTNNA1, DICER1, FANCC, FH, FLCN, GALNT12, KIF1B, LZTR1, MAX, MEN1, MET, MLH1*, MSH2*, MSH3, MSH6*, MUTYH*, NBN, NF1*,  NF2, NTHL1, PALB2*, PHOX2B, PMS2*, POT1, PRKAR1A, PTCH1, PTEN*, RAD51C*, RAD51D*, RB1, RECQL, RET, SDHA, SDHAF2, SDHB, SDHC, SDHD, SMAD4, SMARCA4, SMARCB1, SMARCE1, STK11, SUFU, TMEM127, TP53*, TSC1, TSC2, VHL and XRCC2 (sequencing and deletion/duplication); EGFR, EGLN1, HOXB13, KIT, MITF, PDGFRA, POLD1, and POLE (sequencing only); EPCAM and GREM1 (deletion/duplication only). DNA and RNA analyses performed for * genes.    04/2021 -  Anti-estrogen oral therapy   Letrozole  daily     CHIEF COMPLIANT: Follow-up on letrozole  therapy  HISTORY OF PRESENT ILLNESS:  History of Present Illness   Vickie Collier is a 74 year old female with breast cancer who presents for follow-up care.  She is two years post-diagnosis of breast cancer and is currently on letrozole  therapy. She has experienced some improvement in body aches and pains, which were previously associated with letrozole . No hot flashes are reported.  Her recent mammogram in December was normal. She does not have any breast pain or discomfort.  She has not had a bone density test in the past two years and is uncertain about who ordered the last one.         ALLERGIES:  is allergic to lisinopril.  MEDICATIONS:  Current Outpatient Medications  Medication Sig Dispense Refill   amLODipine  (NORVASC ) 10 MG tablet Take 10 mg by mouth daily.     aspirin  EC 81 MG tablet Take 81 mg by mouth daily. Swallow whole.     baclofen (LIORESAL) 10 MG tablet Take 10 mg by mouth 2 (two) times daily.     cholecalciferol (VITAMIN D3) 25 MCG (1000 UNIT) tablet Take 1,000 Units by mouth daily.     cyanocobalamin 100 MCG tablet Take 100 mcg by mouth daily.     felodipine (  PLENDIL) 5 MG 24 hr tablet Take 5 mg by mouth daily.     letrozole  (FEMARA ) 2.5 MG tablet Take 1 tablet (2.5 mg total) by mouth daily. 90 tablet 3   lidocaine  4 % Place 1 patch onto the skin daily. 10 patch 0   metoprolol succinate (TOPROL-XL) 50 MG 24 hr tablet Take 50 mg by mouth daily.  Take with or immediately following a meal.     pregabalin  (LYRICA ) 150 MG capsule Take 150 mg by mouth 2 (two) times daily.     rosuvastatin  (CRESTOR ) 5 MG tablet Take 5 mg by mouth daily.     valsartan-hydrochlorothiazide (DIOVAN-HCT) 80-12.5 MG tablet Take 1 tablet by mouth daily.     No current facility-administered medications for this visit.    PHYSICAL EXAMINATION: ECOG PERFORMANCE STATUS: 1 - Symptomatic but completely ambulatory  Vitals:   02/19/23 1530  BP: (!) 156/62  Pulse: 69  Resp: 20  Temp: 99 F (37.2 C)  SpO2: 98%   Filed Weights   02/19/23 1530  Weight: 252 lb (114.3 kg)    Physical Exam          (exam performed in the presence of a chaperone)  LABORATORY DATA:  I have reviewed the data as listed    Latest Ref Rng & Units 01/20/2021    2:00 PM  CMP  Glucose 70 - 99 mg/dL 88   BUN 8 - 23 mg/dL 20   Creatinine 9.55 - 1.00 mg/dL 8.60   Sodium 864 - 854 mmol/L 139   Potassium 3.5 - 5.1 mmol/L 4.3   Chloride 98 - 111 mmol/L 105   CO2 22 - 32 mmol/L 24   Calcium  8.9 - 10.3 mg/dL 9.7     No results found for: WBC, HGB, HCT, MCV, PLT, NEUTROABS  ASSESSMENT & PLAN:  Malignant neoplasm of upper-outer quadrant of right breast in female, estrogen receptor positive (HCC) 01/25/2021:Right lumpectomy: Grade 1 invasive lobular cancer, 4 mm, LCIS, 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  Resection of the positive margins 02/15/2020: Benign 2. Adjuvant radiation therapy completed 04/19/2021 3. Adjuvant antiestrogen therapy started 03/15/2021 Genetic counseling   Letrozole  toxicities: Mild hot flashes Joint aches and pains: Although she had joint pains even before starting treatment.   Breast cancer surveillance: Breast exam 02/19/2023: Benign, right breast lymphedema. Mammogram 12/21/2022 at [facility]: Benign breast density category A   She uses a  wheelchair for ambulation   Return to clinic in 1 year for follow-up   Orders Placed This Encounter  Procedures   DG Bone Density    Standing Status:   Future    Expected Date:   05/19/2023    Expiration Date:   02/19/2024    Reason for Exam (SYMPTOM  OR DIAGNOSIS REQUIRED):   post menopausal    Preferred imaging location?:   [facility]    Release to patient:   Immediate   The patient has a good understanding of the overall plan. she agrees with it. she will call with any problems that may develop before the next visit here. Total time spent: 30 mins including face to face time and time spent for planning, charting and co-ordination of care   Viinay K Kadarrius Yanke, MD 02/19/23

## 2023-03-14 DIAGNOSIS — L853 Xerosis cutis: Secondary | ICD-10-CM | POA: Diagnosis not present

## 2023-03-14 DIAGNOSIS — R7303 Prediabetes: Secondary | ICD-10-CM | POA: Diagnosis not present

## 2023-03-14 DIAGNOSIS — D649 Anemia, unspecified: Secondary | ICD-10-CM | POA: Diagnosis not present

## 2023-03-14 DIAGNOSIS — I10 Essential (primary) hypertension: Secondary | ICD-10-CM | POA: Diagnosis not present

## 2023-03-14 DIAGNOSIS — R42 Dizziness and giddiness: Secondary | ICD-10-CM | POA: Diagnosis not present

## 2023-03-14 DIAGNOSIS — E782 Mixed hyperlipidemia: Secondary | ICD-10-CM | POA: Diagnosis not present

## 2023-03-14 DIAGNOSIS — K649 Unspecified hemorrhoids: Secondary | ICD-10-CM | POA: Diagnosis not present

## 2023-03-14 DIAGNOSIS — R059 Cough, unspecified: Secondary | ICD-10-CM | POA: Diagnosis not present

## 2023-04-10 DIAGNOSIS — D649 Anemia, unspecified: Secondary | ICD-10-CM | POA: Diagnosis not present

## 2023-04-10 DIAGNOSIS — D509 Iron deficiency anemia, unspecified: Secondary | ICD-10-CM | POA: Diagnosis not present

## 2023-04-10 DIAGNOSIS — K649 Unspecified hemorrhoids: Secondary | ICD-10-CM | POA: Diagnosis not present

## 2023-04-10 DIAGNOSIS — R269 Unspecified abnormalities of gait and mobility: Secondary | ICD-10-CM | POA: Diagnosis not present

## 2023-04-10 DIAGNOSIS — I10 Essential (primary) hypertension: Secondary | ICD-10-CM | POA: Diagnosis not present

## 2023-04-24 DIAGNOSIS — Z860100 Personal history of colon polyps, unspecified: Secondary | ICD-10-CM | POA: Diagnosis not present

## 2023-04-24 DIAGNOSIS — K219 Gastro-esophageal reflux disease without esophagitis: Secondary | ICD-10-CM | POA: Diagnosis not present

## 2023-04-24 DIAGNOSIS — D649 Anemia, unspecified: Secondary | ICD-10-CM | POA: Diagnosis not present

## 2023-04-24 DIAGNOSIS — D500 Iron deficiency anemia secondary to blood loss (chronic): Secondary | ICD-10-CM | POA: Diagnosis not present

## 2023-05-14 DIAGNOSIS — D500 Iron deficiency anemia secondary to blood loss (chronic): Secondary | ICD-10-CM | POA: Diagnosis not present

## 2023-05-14 DIAGNOSIS — Z1211 Encounter for screening for malignant neoplasm of colon: Secondary | ICD-10-CM | POA: Diagnosis not present

## 2023-05-21 DIAGNOSIS — Z1211 Encounter for screening for malignant neoplasm of colon: Secondary | ICD-10-CM | POA: Diagnosis not present

## 2023-05-21 DIAGNOSIS — Z860100 Personal history of colon polyps, unspecified: Secondary | ICD-10-CM | POA: Diagnosis not present

## 2023-05-21 DIAGNOSIS — K635 Polyp of colon: Secondary | ICD-10-CM | POA: Diagnosis not present

## 2023-05-22 DIAGNOSIS — K2281 Esophageal polyp: Secondary | ICD-10-CM | POA: Diagnosis not present

## 2023-05-31 DIAGNOSIS — K635 Polyp of colon: Secondary | ICD-10-CM | POA: Diagnosis not present

## 2023-10-15 DIAGNOSIS — D649 Anemia, unspecified: Secondary | ICD-10-CM | POA: Diagnosis not present

## 2023-10-15 DIAGNOSIS — K208 Other esophagitis without bleeding: Secondary | ICD-10-CM | POA: Diagnosis not present

## 2023-10-15 DIAGNOSIS — Z860100 Personal history of colon polyps, unspecified: Secondary | ICD-10-CM | POA: Diagnosis not present

## 2023-11-02 ENCOUNTER — Emergency Department (HOSPITAL_BASED_OUTPATIENT_CLINIC_OR_DEPARTMENT_OTHER)

## 2023-11-02 ENCOUNTER — Encounter (HOSPITAL_BASED_OUTPATIENT_CLINIC_OR_DEPARTMENT_OTHER): Payer: Self-pay | Admitting: Emergency Medicine

## 2023-11-02 ENCOUNTER — Emergency Department (HOSPITAL_BASED_OUTPATIENT_CLINIC_OR_DEPARTMENT_OTHER)
Admission: EM | Admit: 2023-11-02 | Discharge: 2023-11-03 | Disposition: A | Discharge status: Home / Self Care | Attending: Emergency Medicine | Admitting: Emergency Medicine

## 2023-11-02 ENCOUNTER — Other Ambulatory Visit: Payer: Self-pay

## 2023-11-02 DIAGNOSIS — R350 Frequency of micturition: Secondary | ICD-10-CM | POA: Insufficient documentation

## 2023-11-02 DIAGNOSIS — Z79899 Other long term (current) drug therapy: Secondary | ICD-10-CM | POA: Diagnosis not present

## 2023-11-02 DIAGNOSIS — R0602 Shortness of breath: Secondary | ICD-10-CM | POA: Insufficient documentation

## 2023-11-02 DIAGNOSIS — R42 Dizziness and giddiness: Secondary | ICD-10-CM | POA: Diagnosis not present

## 2023-11-02 DIAGNOSIS — R944 Abnormal results of kidney function studies: Secondary | ICD-10-CM | POA: Insufficient documentation

## 2023-11-02 DIAGNOSIS — R262 Difficulty in walking, not elsewhere classified: Secondary | ICD-10-CM | POA: Insufficient documentation

## 2023-11-02 DIAGNOSIS — R630 Anorexia: Secondary | ICD-10-CM | POA: Insufficient documentation

## 2023-11-02 DIAGNOSIS — M79604 Pain in right leg: Secondary | ICD-10-CM | POA: Diagnosis not present

## 2023-11-02 DIAGNOSIS — R531 Weakness: Secondary | ICD-10-CM | POA: Diagnosis not present

## 2023-11-02 DIAGNOSIS — Z7982 Long term (current) use of aspirin: Secondary | ICD-10-CM | POA: Diagnosis not present

## 2023-11-02 DIAGNOSIS — M79605 Pain in left leg: Secondary | ICD-10-CM | POA: Diagnosis not present

## 2023-11-02 DIAGNOSIS — M79606 Pain in leg, unspecified: Secondary | ICD-10-CM | POA: Diagnosis not present

## 2023-11-02 DIAGNOSIS — I517 Cardiomegaly: Secondary | ICD-10-CM | POA: Diagnosis not present

## 2023-11-02 LAB — COMPREHENSIVE METABOLIC PANEL WITH GFR
ALT: 6 U/L (ref 0–44)
AST: 21 U/L (ref 15–41)
Albumin: 4.2 g/dL (ref 3.5–5.0)
Alkaline Phosphatase: 71 U/L (ref 38–126)
Anion gap: 22 — ABNORMAL HIGH (ref 5–15)
BUN: 14 mg/dL (ref 8–23)
CO2: 21 mmol/L — ABNORMAL LOW (ref 22–32)
Calcium: 10.3 mg/dL (ref 8.9–10.3)
Chloride: 102 mmol/L (ref 98–111)
Creatinine, Ser: 1.42 mg/dL — ABNORMAL HIGH (ref 0.44–1.00)
GFR, Estimated: 39 mL/min — ABNORMAL LOW (ref >60)
Glucose, Bld: 93 mg/dL (ref 70–99)
Potassium: 3.5 mmol/L (ref 3.5–5.1)
Sodium: 145 mmol/L (ref 135–145)
Total Bilirubin: 1.1 mg/dL (ref 0.0–1.2)
Total Protein: 8.9 g/dL — ABNORMAL HIGH (ref 6.5–8.1)

## 2023-11-02 LAB — CBC
HCT: 34.3 % — ABNORMAL LOW (ref 36.0–46.0)
Hemoglobin: 10.8 g/dL — ABNORMAL LOW (ref 12.0–15.0)
MCH: 25.8 pg — ABNORMAL LOW (ref 26.0–34.0)
MCHC: 31.5 g/dL (ref 30.0–36.0)
MCV: 81.9 fL (ref 80.0–100.0)
Platelets: 254 K/uL (ref 150–400)
RBC: 4.19 MIL/uL (ref 3.87–5.11)
RDW: 17.6 % — ABNORMAL HIGH (ref 11.5–15.5)
WBC: 6.1 K/uL (ref 4.0–10.5)
nRBC: 0 % (ref 0.0–0.2)

## 2023-11-02 LAB — DIFFERENTIAL
Abs Immature Granulocytes: 0.01 K/uL (ref 0.00–0.07)
Basophils Absolute: 0 K/uL (ref 0.0–0.1)
Basophils Relative: 0 %
Eosinophils Absolute: 0 K/uL (ref 0.0–0.5)
Eosinophils Relative: 0 %
Immature Granulocytes: 0 %
Lymphocytes Relative: 12 %
Lymphs Abs: 0.7 K/uL (ref 0.7–4.0)
Monocytes Absolute: 0.3 K/uL (ref 0.1–1.0)
Monocytes Relative: 5 %
Neutro Abs: 5 K/uL (ref 1.7–7.7)
Neutrophils Relative %: 83 %

## 2023-11-02 LAB — CBG MONITORING, ED: Glucose-Capillary: 99 mg/dL (ref 70–99)

## 2023-11-02 NOTE — ED Triage Notes (Signed)
 Pt c/o dizziness since one day last week with occasional SOB. Pt reports hx HTN, compliant with medications. Also reports bilateral knee pain radiating to hips (chronic) and says she has been unable to tolerate bearing weight for a couple of weeks.

## 2023-11-02 NOTE — ED Provider Notes (Signed)
 [place] EMERGENCY DEPARTMENT AT [facility] Provider Note   CSN: [id number] Arrival date & time: 11/02/23  1818     Patient presents with: Leg Pain and Dizziness   Vickie Collier is a 73 y.o. female.   HPI   73 year old female presents emergency department with concern for generalized weakness, worse in the lower extremities along with a couple episodes of lightheadedness and shortness of breath with exertion.  She denies any chest pain or back pain.  No swelling of the lower extremities.  No recent illness, fever.  She endorses urinary frequency but denies any dysuria.  She has had a decreased appetite but denies any N/V/D.  Currently she feels generally weak with cramping muscles.  Prior to Admission medications   Medication Sig Start Date End Date Taking? Authorizing Provider  amLODipine (NORVASC) 10 MG tablet Take 10 mg by mouth daily.    [provider]  aspirin EC 81 MG tablet Take 81 mg by mouth daily. Swallow whole.    [provider]  baclofen (LIORESAL) 10 MG tablet Take 10 mg by mouth 2 (two) times daily.    [provider]  cholecalciferol (VITAMIN D3) 25 MCG (1000 UNIT) tablet Take 1,000 Units by mouth daily.    [provider]  cyanocobalamin 100 MCG tablet Take 100 mcg by mouth daily.    [provider]  felodipine (PLENDIL) 5 MG 24 hr tablet Take 5 mg by mouth daily.    [provider]  letrozole  (FEMARA ) 2.5 MG tablet Take 1 tablet (2.5 mg total) by mouth daily. 02/19/23   Gudena, Vinay, MD  lidocaine  4 % Place 1 patch onto the skin daily. 12/21/22   Neysa Caron PARAS, DO  metoprolol succinate (TOPROL-XL) 50 MG 24 hr tablet Take 50 mg by mouth daily. Take with or immediately following a meal.    [provider]  pregabalin (LYRICA) 150 MG capsule Take 150 mg by mouth 2 (two) times daily.    [provider]  rosuvastatin (CRESTOR) 5 MG tablet Take 5 mg by mouth daily.    [provider]  valsartan-hydrochlorothiazide (DIOVAN-HCT) 80-12.5 MG tablet Take 1 tablet by mouth daily.    [provider]    Allergies: Lisinopril    Review of Systems  Updated Vital Signs BP (!) 150/77   Pulse 60   Temp 98.7 F (37.1 C) (Oral)   Resp 14   Ht 5' 5 (1.651 m)   Wt 120.2 kg   SpO2 97%   BMI 44.10 kg/m   Physical Exam  (all labs ordered are listed, but only abnormal results are displayed) Labs Reviewed  CBC - Abnormal; Notable for the following components:      Result Value   Hemoglobin 10.8 (*)    HCT 34.3 (*)    MCH 25.8 (*)    RDW 17.6 (*)    All other components within normal limits  COMPREHENSIVE METABOLIC PANEL WITH GFR - Abnormal; Notable for the following components:   CO2 21 (*)    Creatinine, Ser 1.42 (*)    Total Protein 8.9 (*)    GFR, Estimated 39 (*)    Anion gap 22 (*)    All other components within normal limits  DIFFERENTIAL  URINALYSIS, ROUTINE W REFLEX MICROSCOPIC  CK  CBG MONITORING, ED  TROPONIN T, HIGH SENSITIVITY    EKG: EKG Interpretation Date/Time:  Saturday November 02 2023 18:37:57 EDT Ventricular Rate:  75 PR Interval:  156 QRS  Duration:  84 QT Interval:  486 QTC Calculation: 542 R Axis:   38  Text Interpretation: Normal sinus rhythm Anterior infarct (cited on or before 20-Jan-2021) ST & T wave abnormality, consider inferolateral ischemia Prolonged QT Abnormal ECG When compared with ECG of 20-Jan-2021 13:59, Significant changes have occurred T wave inversions new Confirmed by Bari Flank [phone]) on 11/02/2023 6:41:20 PM  Radiology: No results found.   Procedures   Medications Ordered in the ED - No data to display                                  Medical Decision Making Amount and/or Complexity of Data Reviewed Labs: ordered. Radiology: ordered.   73 year old female presents to the emergency department with generalized weakness, muscle cramping and episodes of lightheadedness or  shortness of breath.  Currently feels fatigued while lying but no active chest pain.  Vitals are stable on arrival.  EKG has new T wave inversions, compared to previous EKG in 2023.  Initial blood work shows a baseline anemia and creatinine of 1.42 slightly up from a baseline around 1.3.  Will expand her workup to include a urinalysis, respiratory panel, and cardiac workup with x-ray given EKG T wave changes.  Patient signed out pending results and reevaluation.     Final diagnoses:  None    ED Discharge Orders     None          Bari Flank HERO, DO 11/02/23 2325

## 2023-11-03 DIAGNOSIS — E66813 Obesity, class 3: Secondary | ICD-10-CM | POA: Diagnosis present

## 2023-11-03 DIAGNOSIS — J984 Other disorders of lung: Secondary | ICD-10-CM | POA: Diagnosis not present

## 2023-11-03 DIAGNOSIS — Z1152 Encounter for screening for COVID-19: Secondary | ICD-10-CM | POA: Diagnosis not present

## 2023-11-03 DIAGNOSIS — I82462 Acute embolism and thrombosis of left calf muscular vein: Secondary | ICD-10-CM | POA: Diagnosis present

## 2023-11-03 DIAGNOSIS — R570 Cardiogenic shock: Secondary | ICD-10-CM | POA: Diagnosis present

## 2023-11-03 DIAGNOSIS — Z6841 Body Mass Index (BMI) 40.0 and over, adult: Secondary | ICD-10-CM | POA: Diagnosis not present

## 2023-11-03 DIAGNOSIS — I4891 Unspecified atrial fibrillation: Secondary | ICD-10-CM | POA: Diagnosis not present

## 2023-11-03 DIAGNOSIS — R930 Abnormal findings on diagnostic imaging of skull and head, not elsewhere classified: Secondary | ICD-10-CM | POA: Diagnosis not present

## 2023-11-03 DIAGNOSIS — M6281 Muscle weakness (generalized): Secondary | ICD-10-CM | POA: Diagnosis not present

## 2023-11-03 DIAGNOSIS — I071 Rheumatic tricuspid insufficiency: Secondary | ICD-10-CM | POA: Diagnosis present

## 2023-11-03 DIAGNOSIS — R279 Unspecified lack of coordination: Secondary | ICD-10-CM | POA: Diagnosis not present

## 2023-11-03 DIAGNOSIS — C50411 Malignant neoplasm of upper-outer quadrant of right female breast: Secondary | ICD-10-CM | POA: Diagnosis present

## 2023-11-03 DIAGNOSIS — E872 Acidosis, unspecified: Secondary | ICD-10-CM | POA: Diagnosis present

## 2023-11-03 DIAGNOSIS — I5021 Acute systolic (congestive) heart failure: Secondary | ICD-10-CM | POA: Diagnosis not present

## 2023-11-03 DIAGNOSIS — Z7901 Long term (current) use of anticoagulants: Secondary | ICD-10-CM | POA: Diagnosis not present

## 2023-11-03 DIAGNOSIS — E1122 Type 2 diabetes mellitus with diabetic chronic kidney disease: Secondary | ICD-10-CM | POA: Diagnosis present

## 2023-11-03 DIAGNOSIS — I484 Atypical atrial flutter: Secondary | ICD-10-CM | POA: Diagnosis not present

## 2023-11-03 DIAGNOSIS — I5082 Biventricular heart failure: Secondary | ICD-10-CM | POA: Diagnosis present

## 2023-11-03 DIAGNOSIS — D509 Iron deficiency anemia, unspecified: Secondary | ICD-10-CM | POA: Diagnosis present

## 2023-11-03 DIAGNOSIS — I249 Acute ischemic heart disease, unspecified: Secondary | ICD-10-CM | POA: Diagnosis not present

## 2023-11-03 DIAGNOSIS — E119 Type 2 diabetes mellitus without complications: Secondary | ICD-10-CM | POA: Diagnosis not present

## 2023-11-03 DIAGNOSIS — I2510 Atherosclerotic heart disease of native coronary artery without angina pectoris: Secondary | ICD-10-CM | POA: Diagnosis not present

## 2023-11-03 DIAGNOSIS — N179 Acute kidney failure, unspecified: Secondary | ICD-10-CM | POA: Diagnosis not present

## 2023-11-03 DIAGNOSIS — R079 Chest pain, unspecified: Secondary | ICD-10-CM | POA: Diagnosis not present

## 2023-11-03 DIAGNOSIS — I2699 Other pulmonary embolism without acute cor pulmonale: Secondary | ICD-10-CM | POA: Diagnosis not present

## 2023-11-03 DIAGNOSIS — R42 Dizziness and giddiness: Secondary | ICD-10-CM | POA: Diagnosis not present

## 2023-11-03 DIAGNOSIS — N390 Urinary tract infection, site not specified: Secondary | ICD-10-CM | POA: Diagnosis present

## 2023-11-03 DIAGNOSIS — Z4682 Encounter for fitting and adjustment of non-vascular catheter: Secondary | ICD-10-CM | POA: Diagnosis not present

## 2023-11-03 DIAGNOSIS — J9811 Atelectasis: Secondary | ICD-10-CM | POA: Diagnosis present

## 2023-11-03 DIAGNOSIS — Z170 Estrogen receptor positive status [ER+]: Secondary | ICD-10-CM | POA: Diagnosis not present

## 2023-11-03 DIAGNOSIS — R0602 Shortness of breath: Secondary | ICD-10-CM | POA: Diagnosis not present

## 2023-11-03 DIAGNOSIS — I480 Paroxysmal atrial fibrillation: Secondary | ICD-10-CM | POA: Diagnosis present

## 2023-11-03 DIAGNOSIS — R531 Weakness: Secondary | ICD-10-CM | POA: Diagnosis present

## 2023-11-03 DIAGNOSIS — N170 Acute kidney failure with tubular necrosis: Secondary | ICD-10-CM | POA: Diagnosis present

## 2023-11-03 DIAGNOSIS — I5022 Chronic systolic (congestive) heart failure: Secondary | ICD-10-CM | POA: Diagnosis present

## 2023-11-03 DIAGNOSIS — I517 Cardiomegaly: Secondary | ICD-10-CM | POA: Diagnosis not present

## 2023-11-03 DIAGNOSIS — I2609 Other pulmonary embolism with acute cor pulmonale: Secondary | ICD-10-CM | POA: Diagnosis present

## 2023-11-03 DIAGNOSIS — N1832 Chronic kidney disease, stage 3b: Secondary | ICD-10-CM | POA: Diagnosis present

## 2023-11-03 DIAGNOSIS — I130 Hypertensive heart and chronic kidney disease with heart failure and stage 1 through stage 4 chronic kidney disease, or unspecified chronic kidney disease: Secondary | ICD-10-CM | POA: Diagnosis present

## 2023-11-03 DIAGNOSIS — R609 Edema, unspecified: Secondary | ICD-10-CM | POA: Diagnosis not present

## 2023-11-03 DIAGNOSIS — Z452 Encounter for adjustment and management of vascular access device: Secondary | ICD-10-CM | POA: Diagnosis not present

## 2023-11-03 DIAGNOSIS — C50911 Malignant neoplasm of unspecified site of right female breast: Secondary | ICD-10-CM | POA: Diagnosis not present

## 2023-11-03 DIAGNOSIS — E1169 Type 2 diabetes mellitus with other specified complication: Secondary | ICD-10-CM | POA: Diagnosis present

## 2023-11-03 DIAGNOSIS — I10 Essential (primary) hypertension: Secondary | ICD-10-CM | POA: Diagnosis not present

## 2023-11-03 DIAGNOSIS — R7989 Other specified abnormal findings of blood chemistry: Secondary | ICD-10-CM | POA: Diagnosis not present

## 2023-11-03 DIAGNOSIS — R5383 Other fatigue: Secondary | ICD-10-CM | POA: Diagnosis not present

## 2023-11-03 DIAGNOSIS — E785 Hyperlipidemia, unspecified: Secondary | ICD-10-CM | POA: Diagnosis not present

## 2023-11-03 DIAGNOSIS — I214 Non-ST elevation (NSTEMI) myocardial infarction: Secondary | ICD-10-CM | POA: Diagnosis not present

## 2023-11-03 DIAGNOSIS — E66812 Obesity, class 2: Secondary | ICD-10-CM | POA: Diagnosis not present

## 2023-11-03 DIAGNOSIS — R2689 Other abnormalities of gait and mobility: Secondary | ICD-10-CM | POA: Diagnosis not present

## 2023-11-03 DIAGNOSIS — R918 Other nonspecific abnormal finding of lung field: Secondary | ICD-10-CM | POA: Diagnosis not present

## 2023-11-03 DIAGNOSIS — J9601 Acute respiratory failure with hypoxia: Secondary | ICD-10-CM | POA: Diagnosis present

## 2023-11-03 DIAGNOSIS — I509 Heart failure, unspecified: Secondary | ICD-10-CM | POA: Diagnosis not present

## 2023-11-03 DIAGNOSIS — I272 Pulmonary hypertension, unspecified: Secondary | ICD-10-CM | POA: Diagnosis present

## 2023-11-03 LAB — URINALYSIS, ROUTINE W REFLEX MICROSCOPIC
Bilirubin Urine: NEGATIVE
Glucose, UA: NEGATIVE mg/dL
Ketones, ur: 15 mg/dL — AB
Nitrite: NEGATIVE
Protein, ur: 100 mg/dL — AB
Specific Gravity, Urine: 1.016 (ref 1.005–1.030)
pH: 6 (ref 5.0–8.0)

## 2023-11-03 LAB — RESP PANEL BY RT-PCR (RSV, FLU A&B, COVID)  RVPGX2
Influenza A by PCR: NEGATIVE
Influenza B by PCR: NEGATIVE
Resp Syncytial Virus by PCR: NEGATIVE
SARS Coronavirus 2 by RT PCR: NEGATIVE

## 2023-11-03 LAB — TROPONIN T, HIGH SENSITIVITY
Troponin T High Sensitivity: 32 ng/L — ABNORMAL HIGH (ref 0–19)
Troponin T High Sensitivity: 46 ng/L — ABNORMAL HIGH (ref 0–19)

## 2023-11-03 LAB — CK: Total CK: 84 U/L (ref 38–234)

## 2023-11-03 MED ORDER — PREGABALIN 150 MG PO CAPS: 150.0000 mg | ORAL_CAPSULE | Freq: Two times a day (BID) | ORAL | 0 refills | Status: DC

## 2023-11-03 MED ORDER — ACETAMINOPHEN 500 MG PO TABS
1000.0000 mg | ORAL_TABLET | Freq: Once | ORAL | Status: AC
  Administered 2023-11-03: 1000 mg via ORAL
  Filled 2023-11-03: qty 2

## 2023-11-03 MED ORDER — METHOCARBAMOL 500 MG PO TABS
1000.0000 mg | ORAL_TABLET | Freq: Once | ORAL | Status: AC
  Administered 2023-11-03: 1000 mg via ORAL
  Filled 2023-11-03: qty 2

## 2023-11-03 NOTE — ED Provider Notes (Signed)
 I took over care of this patient at 5 PM.  She is pending troponins and remainder of her workup.  Her troponins were elevated but flat and the second 1 decreased.  I had a long discussion with patient and family at bedside and patient really wants to go home. Physical Exam  BP (!) 177/84 (BP Location: Left Arm)   Pulse 60   Temp 98.5 F (36.9 C) (Oral)   Resp 19   Ht 5' 5 (1.651 m)   Wt 120.2 kg   SpO2 95%   BMI 44.10 kg/m   Physical Exam Vitals and nursing note reviewed.  Constitutional:      General: She is not in acute distress.    Appearance: She is well-developed.  HENT:     Head: Normocephalic and atraumatic.  Eyes:     Conjunctiva/sclera: Conjunctivae normal.  Cardiovascular:     Rate and Rhythm: Normal rate and regular rhythm.     Heart sounds: No murmur heard. Pulmonary:     Effort: Pulmonary effort is normal. No respiratory distress.     Breath sounds: Normal breath sounds.  Abdominal:     Palpations: Abdomen is soft.     Tenderness: There is no abdominal tenderness.  Musculoskeletal:        General: No swelling.     Cervical back: Neck supple.  Skin:    General: Skin is warm and dry.     Capillary Refill: Capillary refill takes less than 2 seconds.  Neurological:     Mental Status: She is alert.  Psychiatric:        Mood and Affect: Mood normal.     Procedures  Procedures  ED Course / MDM    Medical Decision Making Workup largely unremarkable except for mild elevation in troponins that is flat and even the second 1 has decreased.  Recommended admission for further workup and management she has had some difficulty walking at home as well.  She really insist on going home.  She has run out of her Lyrica recently and I think this may be contributing to her leg pain.  I will refill this.  Will give her Robaxin and Tylenol  here.  She was able to ambulate with her walker, although it was difficult for her had a very long discussion with patient and family at  bedside.  They insist on going home.  Recommend obtaining close follow-up with primary care and given strict return precautions.  Told to return for any new or worsening symptoms or if she changes her mind.  She feels comfortable to plan to be discharged.  Problems Addressed: Difficulty in walking: undiagnosed new problem with uncertain prognosis Dizziness: undiagnosed new problem with uncertain prognosis Pain of lower extremity, unspecified laterality: acute illness or injury  Amount and/or Complexity of Data Reviewed Labs: ordered. Decision-making details documented in ED Course. Radiology: ordered and independent interpretation performed. Decision-making details documented in ED Course.  Risk OTC drugs. Prescription drug management.          Gennaro Duwaine CROME, DO 11/03/23 [phone]

## 2023-11-03 NOTE — Discharge Instructions (Addendum)
 You can use Tylenol  as needed for pain.  Restart your Lyrica when you can pick up the medicine from the pharmacy.  Please call your primary care doctor Monday to make a follow-up appointment.  Return to the ER for any new or worsening symptoms.

## 2023-11-06 ENCOUNTER — Emergency Department (HOSPITAL_COMMUNITY)

## 2023-11-06 ENCOUNTER — Encounter (HOSPITAL_COMMUNITY): Payer: Self-pay

## 2023-11-06 ENCOUNTER — Inpatient Hospital Stay (HOSPITAL_COMMUNITY)
Admission: EM | Admit: 2023-11-06 | Discharge: 2023-11-15 | DRG: 175 | Disposition: A | Discharge status: Home / Self Care | Attending: Internal Medicine | Admitting: Internal Medicine

## 2023-11-06 ENCOUNTER — Other Ambulatory Visit: Payer: Self-pay

## 2023-11-06 DIAGNOSIS — J9811 Atelectasis: Secondary | ICD-10-CM | POA: Diagnosis present

## 2023-11-06 DIAGNOSIS — D509 Iron deficiency anemia, unspecified: Secondary | ICD-10-CM | POA: Diagnosis present

## 2023-11-06 DIAGNOSIS — I071 Rheumatic tricuspid insufficiency: Secondary | ICD-10-CM | POA: Diagnosis present

## 2023-11-06 DIAGNOSIS — E872 Acidosis, unspecified: Secondary | ICD-10-CM | POA: Diagnosis present

## 2023-11-06 DIAGNOSIS — I2609 Other pulmonary embolism with acute cor pulmonale: Principal | ICD-10-CM | POA: Diagnosis present

## 2023-11-06 DIAGNOSIS — E785 Hyperlipidemia, unspecified: Secondary | ICD-10-CM | POA: Diagnosis not present

## 2023-11-06 DIAGNOSIS — I484 Atypical atrial flutter: Secondary | ICD-10-CM | POA: Diagnosis not present

## 2023-11-06 DIAGNOSIS — G8929 Other chronic pain: Secondary | ICD-10-CM | POA: Diagnosis present

## 2023-11-06 DIAGNOSIS — Z923 Personal history of irradiation: Secondary | ICD-10-CM

## 2023-11-06 DIAGNOSIS — Z7901 Long term (current) use of anticoagulants: Secondary | ICD-10-CM

## 2023-11-06 DIAGNOSIS — I509 Heart failure, unspecified: Secondary | ICD-10-CM | POA: Diagnosis not present

## 2023-11-06 DIAGNOSIS — R5383 Other fatigue: Secondary | ICD-10-CM

## 2023-11-06 DIAGNOSIS — E66813 Obesity, class 3: Secondary | ICD-10-CM | POA: Diagnosis present

## 2023-11-06 DIAGNOSIS — K219 Gastro-esophageal reflux disease without esophagitis: Secondary | ICD-10-CM | POA: Diagnosis present

## 2023-11-06 DIAGNOSIS — I249 Acute ischemic heart disease, unspecified: Secondary | ICD-10-CM | POA: Diagnosis present

## 2023-11-06 DIAGNOSIS — I2699 Other pulmonary embolism without acute cor pulmonale: Secondary | ICD-10-CM

## 2023-11-06 DIAGNOSIS — J9601 Acute respiratory failure with hypoxia: Secondary | ICD-10-CM | POA: Diagnosis present

## 2023-11-06 DIAGNOSIS — I10 Essential (primary) hypertension: Secondary | ICD-10-CM | POA: Diagnosis not present

## 2023-11-06 DIAGNOSIS — I82462 Acute embolism and thrombosis of left calf muscular vein: Secondary | ICD-10-CM | POA: Diagnosis present

## 2023-11-06 DIAGNOSIS — Z90710 Acquired absence of both cervix and uterus: Secondary | ICD-10-CM

## 2023-11-06 DIAGNOSIS — N390 Urinary tract infection, site not specified: Secondary | ICD-10-CM | POA: Diagnosis present

## 2023-11-06 DIAGNOSIS — Z79811 Long term (current) use of aromatase inhibitors: Secondary | ICD-10-CM

## 2023-11-06 DIAGNOSIS — Z86718 Personal history of other venous thrombosis and embolism: Secondary | ICD-10-CM

## 2023-11-06 DIAGNOSIS — Z1152 Encounter for screening for COVID-19: Secondary | ICD-10-CM | POA: Diagnosis not present

## 2023-11-06 DIAGNOSIS — R079 Chest pain, unspecified: Secondary | ICD-10-CM | POA: Diagnosis not present

## 2023-11-06 DIAGNOSIS — M1990 Unspecified osteoarthritis, unspecified site: Secondary | ICD-10-CM | POA: Diagnosis present

## 2023-11-06 DIAGNOSIS — C50411 Malignant neoplasm of upper-outer quadrant of right female breast: Secondary | ICD-10-CM | POA: Diagnosis present

## 2023-11-06 DIAGNOSIS — Z7982 Long term (current) use of aspirin: Secondary | ICD-10-CM

## 2023-11-06 DIAGNOSIS — N170 Acute kidney failure with tubular necrosis: Secondary | ICD-10-CM | POA: Diagnosis present

## 2023-11-06 DIAGNOSIS — E7849 Other hyperlipidemia: Secondary | ICD-10-CM | POA: Diagnosis present

## 2023-11-06 DIAGNOSIS — Z79899 Other long term (current) drug therapy: Secondary | ICD-10-CM

## 2023-11-06 DIAGNOSIS — N1832 Chronic kidney disease, stage 3b: Secondary | ICD-10-CM | POA: Diagnosis present

## 2023-11-06 DIAGNOSIS — Z170 Estrogen receptor positive status [ER+]: Secondary | ICD-10-CM | POA: Diagnosis not present

## 2023-11-06 DIAGNOSIS — E1122 Type 2 diabetes mellitus with diabetic chronic kidney disease: Secondary | ICD-10-CM | POA: Diagnosis present

## 2023-11-06 DIAGNOSIS — G4733 Obstructive sleep apnea (adult) (pediatric): Secondary | ICD-10-CM | POA: Diagnosis present

## 2023-11-06 DIAGNOSIS — I214 Non-ST elevation (NSTEMI) myocardial infarction: Secondary | ICD-10-CM | POA: Diagnosis not present

## 2023-11-06 DIAGNOSIS — E66812 Obesity, class 2: Secondary | ICD-10-CM

## 2023-11-06 DIAGNOSIS — I480 Paroxysmal atrial fibrillation: Secondary | ICD-10-CM | POA: Diagnosis present

## 2023-11-06 DIAGNOSIS — Z96653 Presence of artificial knee joint, bilateral: Secondary | ICD-10-CM | POA: Diagnosis present

## 2023-11-06 DIAGNOSIS — E1169 Type 2 diabetes mellitus with other specified complication: Secondary | ICD-10-CM | POA: Diagnosis present

## 2023-11-06 DIAGNOSIS — I130 Hypertensive heart and chronic kidney disease with heart failure and stage 1 through stage 4 chronic kidney disease, or unspecified chronic kidney disease: Secondary | ICD-10-CM | POA: Diagnosis present

## 2023-11-06 DIAGNOSIS — Z6841 Body Mass Index (BMI) 40.0 and over, adult: Secondary | ICD-10-CM | POA: Diagnosis not present

## 2023-11-06 DIAGNOSIS — I5021 Acute systolic (congestive) heart failure: Secondary | ICD-10-CM | POA: Diagnosis not present

## 2023-11-06 DIAGNOSIS — R570 Cardiogenic shock: Secondary | ICD-10-CM | POA: Diagnosis present

## 2023-11-06 DIAGNOSIS — R000 Tachycardia, unspecified: Secondary | ICD-10-CM | POA: Diagnosis not present

## 2023-11-06 DIAGNOSIS — I493 Ventricular premature depolarization: Secondary | ICD-10-CM | POA: Diagnosis not present

## 2023-11-06 DIAGNOSIS — I4891 Unspecified atrial fibrillation: Secondary | ICD-10-CM | POA: Diagnosis not present

## 2023-11-06 DIAGNOSIS — R531 Weakness: Secondary | ICD-10-CM | POA: Diagnosis present

## 2023-11-06 DIAGNOSIS — I5082 Biventricular heart failure: Secondary | ICD-10-CM | POA: Diagnosis present

## 2023-11-06 DIAGNOSIS — R7989 Other specified abnormal findings of blood chemistry: Secondary | ICD-10-CM

## 2023-11-06 DIAGNOSIS — I5022 Chronic systolic (congestive) heart failure: Secondary | ICD-10-CM | POA: Diagnosis present

## 2023-11-06 DIAGNOSIS — I272 Pulmonary hypertension, unspecified: Secondary | ICD-10-CM | POA: Diagnosis present

## 2023-11-06 DIAGNOSIS — Z888 Allergy status to other drugs, medicaments and biological substances status: Secondary | ICD-10-CM

## 2023-11-06 DIAGNOSIS — N179 Acute kidney failure, unspecified: Secondary | ICD-10-CM | POA: Diagnosis not present

## 2023-11-06 DIAGNOSIS — Z8249 Family history of ischemic heart disease and other diseases of the circulatory system: Secondary | ICD-10-CM

## 2023-11-06 DIAGNOSIS — I9589 Other hypotension: Secondary | ICD-10-CM | POA: Diagnosis not present

## 2023-11-06 DIAGNOSIS — R609 Edema, unspecified: Secondary | ICD-10-CM | POA: Diagnosis not present

## 2023-11-06 DIAGNOSIS — R0602 Shortness of breath: Secondary | ICD-10-CM

## 2023-11-06 DIAGNOSIS — M549 Dorsalgia, unspecified: Secondary | ICD-10-CM | POA: Diagnosis present

## 2023-11-06 LAB — CBC WITH DIFFERENTIAL/PLATELET
Abs Immature Granulocytes: 0.02 K/uL (ref 0.00–0.07)
Basophils Absolute: 0 K/uL (ref 0.0–0.1)
Basophils Relative: 0 %
Eosinophils Absolute: 0 K/uL (ref 0.0–0.5)
Eosinophils Relative: 0 %
HCT: 36.1 % (ref 36.0–46.0)
Hemoglobin: 10.8 g/dL — ABNORMAL LOW (ref 12.0–15.0)
Immature Granulocytes: 0 %
Lymphocytes Relative: 14 %
Lymphs Abs: 1.1 K/uL (ref 0.7–4.0)
MCH: 26 pg (ref 26.0–34.0)
MCHC: 29.9 g/dL — ABNORMAL LOW (ref 30.0–36.0)
MCV: 86.8 fL (ref 80.0–100.0)
Monocytes Absolute: 0.5 K/uL (ref 0.1–1.0)
Monocytes Relative: 6 %
Neutro Abs: 6.2 K/uL (ref 1.7–7.7)
Neutrophils Relative %: 80 %
Platelets: 222 K/uL (ref 150–400)
RBC: 4.16 MIL/uL (ref 3.87–5.11)
RDW: 18.7 % — ABNORMAL HIGH (ref 11.5–15.5)
WBC: 7.9 K/uL (ref 4.0–10.5)
nRBC: 0 % (ref 0.0–0.2)

## 2023-11-06 LAB — COMPREHENSIVE METABOLIC PANEL WITH GFR
ALT: 6 U/L (ref 0–44)
AST: 47 U/L — ABNORMAL HIGH (ref 15–41)
Albumin: 3.7 g/dL (ref 3.5–5.0)
Alkaline Phosphatase: 65 U/L (ref 38–126)
Anion gap: 14 (ref 5–15)
BUN: 26 mg/dL — ABNORMAL HIGH (ref 8–23)
CO2: 24 mmol/L (ref 22–32)
Calcium: 9.4 mg/dL (ref 8.9–10.3)
Chloride: 107 mmol/L (ref 98–111)
Creatinine, Ser: 2.06 mg/dL — ABNORMAL HIGH (ref 0.44–1.00)
GFR, Estimated: 25 mL/min — ABNORMAL LOW (ref >60)
Glucose, Bld: 122 mg/dL — ABNORMAL HIGH (ref 70–99)
Potassium: 4.3 mmol/L (ref 3.5–5.1)
Sodium: 144 mmol/L (ref 135–145)
Total Bilirubin: 0.5 mg/dL (ref 0.0–1.2)
Total Protein: 7.8 g/dL (ref 6.5–8.1)

## 2023-11-06 LAB — I-STAT CG4 LACTIC ACID, ED
Lactic Acid, Venous: 3.9 mmol/L (ref 0.5–1.9)
Lactic Acid, Venous: 2.9 mmol/L (ref 0.5–1.9)

## 2023-11-06 LAB — RESP PANEL BY RT-PCR (RSV, FLU A&B, COVID)  RVPGX2
Influenza A by PCR: NEGATIVE
Influenza B by PCR: NEGATIVE
Resp Syncytial Virus by PCR: NEGATIVE
SARS Coronavirus 2 by RT PCR: NEGATIVE

## 2023-11-06 LAB — TROPONIN T, HIGH SENSITIVITY
Troponin T High Sensitivity: 1349 ng/L (ref 0–19)
Troponin T High Sensitivity: 1364 ng/L (ref 0–19)

## 2023-11-06 LAB — TSH: TSH: 2.23 u[IU]/mL (ref 0.350–4.500)

## 2023-11-06 LAB — PRO BRAIN NATRIURETIC PEPTIDE: Pro Brain Natriuretic Peptide: >35000 pg/mL — ABNORMAL HIGH (ref <300.0)

## 2023-11-06 MED ORDER — SODIUM CHLORIDE 0.9 % IV BOLUS
500.0000 mL | Freq: Once | INTRAVENOUS | Status: AC
  Administered 2023-11-06: 500 mL via INTRAVENOUS

## 2023-11-06 MED ORDER — ASPIRIN 300 MG RE SUPP: 300.0000 mg | RECTAL | Status: DC

## 2023-11-06 MED ORDER — ASPIRIN 81 MG PO TBEC
81.0000 mg | DELAYED_RELEASE_TABLET | Freq: Every day | ORAL | Status: DC
  Administered 2023-11-07 – 2023-11-08 (×2): 81 mg via ORAL
  Filled 2023-11-06 (×2): qty 1

## 2023-11-06 MED ORDER — HEPARIN BOLUS VIA INFUSION
4000.0000 [IU] | Freq: Once | INTRAVENOUS | Status: AC
  Administered 2023-11-06: 4000 [IU] via INTRAVENOUS
  Filled 2023-11-06: qty 4000

## 2023-11-06 MED ORDER — ASPIRIN 81 MG PO CHEW
324.0000 mg | CHEWABLE_TABLET | Freq: Once | ORAL | Status: AC
  Administered 2023-11-06: 324 mg via ORAL
  Filled 2023-11-06: qty 4

## 2023-11-06 MED ORDER — NITROGLYCERIN 0.4 MG SL SUBL
0.4000 mg | SUBLINGUAL_TABLET | SUBLINGUAL | Status: DC | PRN
  Filled 2023-11-06: qty 1

## 2023-11-06 MED ORDER — FUROSEMIDE 10 MG/ML IJ SOLN
40.0000 mg | Freq: Once | INTRAMUSCULAR | Status: AC
  Administered 2023-11-06: 40 mg via INTRAVENOUS
  Filled 2023-11-06: qty 4

## 2023-11-06 MED ORDER — ONDANSETRON HCL 4 MG/2ML IJ SOLN: 4.0000 mg | Freq: Four times a day (QID) | INTRAMUSCULAR | Status: DC | PRN

## 2023-11-06 MED ORDER — ROSUVASTATIN CALCIUM 5 MG PO TABS
10.0000 mg | ORAL_TABLET | Freq: Every day | ORAL | Status: DC
  Administered 2023-11-06 – 2023-11-07 (×2): 10 mg via ORAL
  Filled 2023-11-06 (×2): qty 1

## 2023-11-06 MED ORDER — ASPIRIN 81 MG PO CHEW: 324.0000 mg | CHEWABLE_TABLET | ORAL | Status: DC

## 2023-11-06 MED ORDER — PREGABALIN 75 MG PO CAPS
150.0000 mg | ORAL_CAPSULE | Freq: Two times a day (BID) | ORAL | Status: DC
  Administered 2023-11-06 – 2023-11-15 (×18): 150 mg via ORAL
  Filled 2023-11-06 (×11): qty 2
  Filled 2023-11-06 (×2): qty 3
  Filled 2023-11-06 (×5): qty 2

## 2023-11-06 MED ORDER — ACETAMINOPHEN 325 MG PO TABS
650.0000 mg | ORAL_TABLET | ORAL | Status: DC | PRN
  Administered 2023-11-07 – 2023-11-08 (×2): 650 mg via ORAL
  Filled 2023-11-06 (×2): qty 2

## 2023-11-06 MED ORDER — SODIUM CHLORIDE 0.9 % IV SOLN
2.0000 g | Freq: Once | INTRAVENOUS | Status: AC
  Administered 2023-11-06: 2 g via INTRAVENOUS
  Filled 2023-11-06: qty 20

## 2023-11-06 MED ORDER — HEPARIN (PORCINE) 25000 UT/250ML-% IV SOLN
1200.0000 [IU]/h | INTRAVENOUS | Status: DC
  Administered 2023-11-06: 1200 [IU]/h via INTRAVENOUS
  Filled 2023-11-06: qty 250

## 2023-11-06 NOTE — H&P (Signed)
 HISTORY AND PHYSICAL    Vickie Collier   FMW:[id number] DOB: 1950/10/08   Date of Service: 11/06/23 Requesting physician/APP from ED: Treatment Team:  Attending Provider: Simon Lavonia SAILOR, MD  PCP: Benjamine Aland, MD     HPI: Vickie Collier is a 73 y.o. female w/ PMH essential HTN, breast cancer, HLD. She presented to the emergency department today 11/06/23 with chief complaint of increased weakness over the past week or so.  Family states that they tried to get her up out of a chair today, she was unable to ambulate.  Patient states weakness relatively sudden onset, associated with shortness of breath, denies chest pain, reports vertigo/dizziness.  Of note, patient was seen in the ED few days ago, 11/02/2023 with chief complaint intermittent dizziness, knee pain.  At that visit, VSS, EKG new T wave inversions compared to previous EKG in 2023, troponins above normal but flat, recommendation was for admission to hospital for observation/EKG changes but patient and family insisted on going home, see EDP notes.  In ED, labs remarkable for troponin elevation 1349, BN P greater than 35,000, creatinine elevation 2.06 compared to 1.42 on 10/18, lactate mild elevation 3.9, hemoglobin stable 10.8, negative COVID/flu/RSV, CXR cardiomegaly no active disease otherwise -images personally reviewed, question mild pleural effusion.  Urinalysis pending.  EDP reached out to cardiology, started aspirin load and heparin drip.  Hospitalist is asked to admit     Consultants:  Cardiology   Procedures: none      ASSESSMENT & PLAN:   ACS/NSTEMI  ASA Heparin gtt Trend EKG - currently no STEMI, repeat 12 lead ordered for 02:00 Trend troponin to peak, check w/ AM labs  Cardiology consulted by EDP - expect will need ischemic w/u  Admit to progressive unit Maintain telemetry Echocardiogram  Beta blocker - home metoprolol --> Coreg bid  Statin - continue increased dose home Crestor Trend CBC, B.MP    CHF clinical diagnosis w/ rales, pulmonary edema on CXR, orthopnea, DOE,  Diuresis - caution w/ AKI but suspect this is cardiorenal  Strict I&O Ok for [place] if needed  Cardiology to follow - GCMT based on renal fxn and BP   AKI on CKD3a Suspect cardiorenal syndrome  Hold fluids for now and diuresis as above Monitor BMP  Mild elevation AST Suspect hepatic congestion from CHF Monitor hepatic panel w/ diuresis   Essential HTN Cardiac meds as above  Additional labetalol IV prn severe elevation BP   DVT prophylaxis: on heparin gtt  Pertinent IV fluids/nutrition: no IV fluids for now, cardiac diet w/ NPO p midnight  Central lines / invasive devices: none  Code Status: FULL CODE Family Communication: family ad bedside on admission  Disposition: inpatient progressive unit  TOC needs: TBD expect may need rehab or at least HH/DME                   Review of Systems:  Review of Systems  Constitutional:  Positive for malaise/fatigue. Negative for chills, fever and weight loss.  Respiratory:  Positive for shortness of breath and wheezing. Negative for cough, hemoptysis and sputum production.   Cardiovascular:  Positive for orthopnea and PND. Negative for chest pain, palpitations, claudication and leg swelling.  Gastrointestinal:  Negative for abdominal pain, blood in stool, constipation, diarrhea, heartburn, melena, nausea and vomiting.  Genitourinary:  Negative for dysuria, frequency and urgency.  Musculoskeletal:  Positive for myalgias (reports chronic lower extermity soreness and knee arthritis).  Neurological:  Positive for dizziness and weakness. Negative  for focal weakness, seizures and loss of consciousness.  Psychiatric/Behavioral:  Negative for depression.        has a past medical history of Arthritis, Atypical ductal hyperplasia of right breast, Breast cancer (HCC) (01/25/2021), Family history of ovarian cancer, Family history of pancreatic cancer,  Family history of prostate cancer, Family history of stomach cancer, GERD (gastroesophageal reflux disease), Hyperlipidemia, Hypertension, and Personal history of radiation therapy.   No current facility-administered medications on file prior to encounter.   Current Outpatient Medications on File Prior to Encounter  Medication Sig Dispense Refill   amLODipine (NORVASC) 10 MG tablet Take 10 mg by mouth daily.     aspirin EC 81 MG tablet Take 81 mg by mouth daily. Swallow whole.     baclofen (LIORESAL) 10 MG tablet Take 10 mg by mouth 2 (two) times daily.     cholecalciferol (VITAMIN D3) 25 MCG (1000 UNIT) tablet Take 1,000 Units by mouth daily.     cyanocobalamin 100 MCG tablet Take 100 mcg by mouth daily.     felodipine (PLENDIL) 5 MG 24 hr tablet Take 5 mg by mouth daily.     letrozole  (FEMARA ) 2.5 MG tablet Take 1 tablet (2.5 mg total) by mouth daily. 90 tablet 3   lidocaine  4 % Place 1 patch onto the skin daily. 10 patch 0   metoprolol succinate (TOPROL-XL) 50 MG 24 hr tablet Take 50 mg by mouth daily. Take with or immediately following a meal.     pregabalin (LYRICA) 150 MG capsule Take 150 mg by mouth 2 (two) times daily.     pregabalin (LYRICA) 150 MG capsule Take 1 capsule (150 mg total) by mouth 2 (two) times daily. 30 capsule 0   rosuvastatin (CRESTOR) 5 MG tablet Take 5 mg by mouth daily.     valsartan-hydrochlorothiazide (DIOVAN-HCT) 80-12.5 MG tablet Take 1 tablet by mouth daily.       Allergies  Allergen Reactions   Lisinopril Cough      family history includes Cancer in her cousin; Lung cancer in her cousin; Lupus in her father; Ovarian cancer (age of onset: 54) in her maternal grandmother; Pancreatic cancer in her cousin; Prostate cancer (age of onset: 37) in her half-brother; Stomach cancer in her cousin. Past Surgical History:  Procedure Laterality Date   ABDOMINAL HYSTERECTOMY     BREAST LUMPECTOMY     JOINT REPLACEMENT Bilateral    TKR   RADIOACTIVE SEED GUIDED  EXCISIONAL BREAST BIOPSY Right 01/25/2021   Procedure: RADIOACTIVE SEED GUIDED EXCISIONAL RIGHT BREAST BIOPSY;  Surgeon: Aron Shoulders, MD;  Location: [facility];  Service: General;  Laterality: Right;   RE-EXCISION OF BREAST LUMPECTOMY Right 02/14/2021   Procedure: RE-EXCISION OF RIGHT BREAST LUMPECTOMY;  Surgeon: Aron Shoulders, MD;  Location: [facility];  Service: General;  Laterality: Right;          Objective Findings:  Vitals:   11/06/23 1644 11/06/23 1647 11/06/23 1700 11/06/23 1945  BP: (!) 111/100  122/77 (!) 152/93  Pulse: 78  74 68  Resp: 20  16 (!) 23  Temp: 97.9 F (36.6 C)   97.6 F (36.4 C)  TempSrc: Oral   Oral  SpO2: 95% 95% 93% 99%    Intake/Output Summary (Last 24 hours) at 11/06/2023 1947 Last data filed at 11/06/2023 1856 Gross per 24 hour  Intake 100 ml  Output --  Net 100 ml   There were no vitals filed for this visit.  Examination:  Physical  Exam Constitutional:      General: She is not in acute distress.    Appearance: She is not ill-appearing.  Cardiovascular:     Rate and Rhythm: Normal rate and regular rhythm.  Pulmonary:     Effort: Pulmonary effort is normal. No respiratory distress.     Breath sounds: Wheezing and rales present.  Abdominal:     General: Bowel sounds are normal. There is no distension.     Tenderness: There is no abdominal tenderness.  Musculoskeletal:     Right lower leg: No edema.     Left lower leg: No edema.  Skin:    General: Skin is warm and dry.  Neurological:     General: No focal deficit present.     Mental Status: She is alert and oriented to person, place, and time.  Psychiatric:        Mood and Affect: Mood normal.        Behavior: Behavior normal.          Scheduled Medications:    Continuous Infusions:  heparin 1,200 Units/hr (11/06/23 1945)    PRN Medications:    Antimicrobials:  Anti-infectives (From admission, onward)    Start     Dose/Rate Route  Frequency Ordered Stop   11/06/23 1800  cefTRIAXone (ROCEPHIN) 2 g in sodium chloride  0.9 % 100 mL IVPB        2 g 200 mL/hr over 30 Minutes Intravenous  Once 11/06/23 1745 11/06/23 1856           Data Reviewed: I have personally reviewed following labs and imaging studies  CBC: Recent Labs  Lab 11/02/23 1843 11/06/23 1733  WBC 6.1 7.9  NEUTROABS 5.0 6.2  HGB 10.8* 10.8*  HCT 34.3* 36.1  MCV 81.9 86.8  PLT 254 222   Basic Metabolic Panel: Recent Labs  Lab 11/02/23 1843 11/06/23 1733  NA 145 144  K 3.5 4.3  CL 102 107  CO2 21* 24  GLUCOSE 93 122*  BUN 14 26*  CREATININE 1.42* 2.06*  CALCIUM 10.3 9.4   GFR: Estimated Creatinine Clearance: 31.6 mL/min (A) (by C-G formula based on SCr of 2.06 mg/dL (H)). Liver Function Tests: Recent Labs  Lab 11/02/23 1843 11/06/23 1733  AST 21 47*  ALT 6 6  ALKPHOS 71 65  BILITOT 1.1 0.5  PROT 8.9* 7.8  ALBUMIN 4.2 3.7   No results for input(s): LIPASE, AMYLASE in the last 168 hours. No results for input(s): AMMONIA in the last 168 hours. Coagulation Profile: No results for input(s): INR, PROTIME in the last 168 hours. Cardiac Enzymes: Recent Labs  Lab 11/02/23 1843  CKTOTAL 84   BNP (last 3 results) Recent Labs    11/06/23 1733  PROBNP >35,000.0*   HbA1C: No results for input(s): HGBA1C in the last 72 hours. CBG: Recent Labs  Lab 11/02/23 1834  GLUCAP 99   Lipid Profile: No results for input(s): CHOL, HDL, LDLCALC, TRIG, CHOLHDL, LDLDIRECT in the last 72 hours. Thyroid Function Tests: Recent Labs    11/06/23 1733  TSH 2.230   Anemia Panel: No results for input(s): VITAMINB12, FOLATE, FERRITIN, TIBC, IRON, RETICCTPCT in the last 72 hours. Most Recent Urinalysis On File:     Component Value Date/Time   COLORURINE YELLOW 11/02/2023 2355   APPEARANCEUR CLEAR 11/02/2023 2355   LABSPEC 1.016 11/02/2023 2355   PHURINE 6.0 11/02/2023 2355   GLUCOSEU NEGATIVE  11/02/2023 2355   HGBUR SMALL (A) 11/02/2023 2355   BILIRUBINUR NEGATIVE  11/02/2023 2355   KETONESUR 15 (A) 11/02/2023 2355   PROTEINUR 100 (A) 11/02/2023 2355   NITRITE NEGATIVE 11/02/2023 2355   LEUKOCYTESUR SMALL (A) 11/02/2023 2355   Sepsis Labs: @LABRCNTIP (procalcitonin:4,lacticidven:4)  Recent Results (from the past 240 hours)  Resp panel by RT-PCR (RSV, Flu A&B, Covid) Urine, Clean Catch     Status: None   Collection Time: 11/02/23 11:56 PM   Specimen: Urine, Clean Catch; Nasal Swab  Result Value Ref Range Status   SARS Coronavirus 2 by RT PCR NEGATIVE NEGATIVE Final    Comment: (NOTE) SARS-CoV-2 target nucleic acids are NOT DETECTED.  The SARS-CoV-2 RNA is generally detectable in upper respiratory specimens during the acute phase of infection. The lowest concentration of SARS-CoV-2 viral copies this assay can detect is 138 copies/mL. A negative result does not preclude SARS-Cov-2 infection and should not be used as the sole basis for treatment or other patient management decisions. A negative result may occur with  improper specimen collection/handling, submission of specimen other than nasopharyngeal swab, presence of viral mutation(s) within the areas targeted by this assay, and inadequate number of viral copies(<138 copies/mL). A negative result must be combined with clinical observations, patient history, and epidemiological information. The expected result is Negative.  Fact Sheet for Patients:  BloggerCourse.com  Fact Sheet for Healthcare Providers:  SeriousBroker.it  This test is no t yet approved or cleared by the [place]  FDA and  has been authorized for detection and/or diagnosis of SARS-CoV-2 by FDA under an Emergency Use Authorization (EUA). This EUA will remain  in effect (meaning this test can be used) for the duration of the COVID-19 declaration under Section 564(b)(1) of the Act, 21 U.S.C.section  360bbb-3(b)(1), unless the authorization is terminated  or revoked sooner.       Influenza A by PCR NEGATIVE NEGATIVE Final   Influenza B by PCR NEGATIVE NEGATIVE Final    Comment: (NOTE) The Xpert Xpress SARS-CoV-2/FLU/RSV plus assay is intended as an aid in the diagnosis of influenza from Nasopharyngeal swab specimens and should not be used as a sole basis for treatment. Nasal washings and aspirates are unacceptable for Xpert Xpress SARS-CoV-2/FLU/RSV testing.  Fact Sheet for Patients: BloggerCourse.com  Fact Sheet for Healthcare Providers: SeriousBroker.it  This test is not yet approved or cleared by the [place]  FDA and has been authorized for detection and/or diagnosis of SARS-CoV-2 by FDA under an Emergency Use Authorization (EUA). This EUA will remain in effect (meaning this test can be used) for the duration of the COVID-19 declaration under Section 564(b)(1) of the Act, 21 U.S.C. section 360bbb-3(b)(1), unless the authorization is terminated or revoked.     Resp Syncytial Virus by PCR NEGATIVE NEGATIVE Final    Comment: (NOTE) Fact Sheet for Patients: BloggerCourse.com  Fact Sheet for Healthcare Providers: SeriousBroker.it  This test is not yet approved or cleared by the [place]  FDA and has been authorized for detection and/or diagnosis of SARS-CoV-2 by FDA under an Emergency Use Authorization (EUA). This EUA will remain in effect (meaning this test can be used) for the duration of the COVID-19 declaration under Section 564(b)(1) of the Act, 21 U.S.C. section 360bbb-3(b)(1), unless the authorization is terminated or revoked.  Performed at [facility], [address] [place], [place] [address]   Resp panel by RT-PCR (RSV, Flu A&B, Covid) Anterior Nasal Swab     Status: None   Collection Time: 11/06/23  5:33 PM   Specimen: Anterior  Nasal Swab  Result Value Ref Range  Status   SARS Coronavirus 2 by RT PCR NEGATIVE NEGATIVE Final    Comment: (NOTE) SARS-CoV-2 target nucleic acids are NOT DETECTED.  The SARS-CoV-2 RNA is generally detectable in upper respiratory specimens during the acute phase of infection. The lowest concentration of SARS-CoV-2 viral copies this assay can detect is 138 copies/mL. A negative result does not preclude SARS-Cov-2 infection and should not be used as the sole basis for treatment or other patient management decisions. A negative result may occur with  improper specimen collection/handling, submission of specimen other than nasopharyngeal swab, presence of viral mutation(s) within the areas targeted by this assay, and inadequate number of viral copies(<138 copies/mL). A negative result must be combined with clinical observations, patient history, and epidemiological information. The expected result is Negative.  Fact Sheet for Patients:  BloggerCourse.com  Fact Sheet for Healthcare Providers:  SeriousBroker.it  This test is no t yet approved or cleared by the [place]  FDA and  has been authorized for detection and/or diagnosis of SARS-CoV-2 by FDA under an Emergency Use Authorization (EUA). This EUA will remain  in effect (meaning this test can be used) for the duration of the COVID-19 declaration under Section 564(b)(1) of the Act, 21 U.S.C.section 360bbb-3(b)(1), unless the authorization is terminated  or revoked sooner.       Influenza A by PCR NEGATIVE NEGATIVE Final   Influenza B by PCR NEGATIVE NEGATIVE Final    Comment: (NOTE) The Xpert Xpress SARS-CoV-2/FLU/RSV plus assay is intended as an aid in the diagnosis of influenza from Nasopharyngeal swab specimens and should not be used as a sole basis for treatment. Nasal washings and aspirates are unacceptable for Xpert Xpress SARS-CoV-2/FLU/RSV testing.  Fact Sheet for  Patients: BloggerCourse.com  Fact Sheet for Healthcare Providers: SeriousBroker.it  This test is not yet approved or cleared by the [place]  FDA and has been authorized for detection and/or diagnosis of SARS-CoV-2 by FDA under an Emergency Use Authorization (EUA). This EUA will remain in effect (meaning this test can be used) for the duration of the COVID-19 declaration under Section 564(b)(1) of the Act, 21 U.S.C. section 360bbb-3(b)(1), unless the authorization is terminated or revoked.     Resp Syncytial Virus by PCR NEGATIVE NEGATIVE Final    Comment: (NOTE) Fact Sheet for Patients: BloggerCourse.com  Fact Sheet for Healthcare Providers: SeriousBroker.it  This test is not yet approved or cleared by the [place]  FDA and has been authorized for detection and/or diagnosis of SARS-CoV-2 by FDA under an Emergency Use Authorization (EUA). This EUA will remain in effect (meaning this test can be used) for the duration of the COVID-19 declaration under Section 564(b)(1) of the Act, 21 U.S.C. section 360bbb-3(b)(1), unless the authorization is terminated or revoked.  Performed at [facility], [address] [place], [place] [address]          Radiology Studies: [facility] Chest Port 1 View Result Date: 11/06/2023 CLINICAL DATA:  Shortness of breath. EXAM: PORTABLE CHEST 1 VIEW COMPARISON:  Chest radiograph dated 11/02/2023 FINDINGS: No focal consolidation, pleural effusion or pneumothorax. Stable cardiomegaly. No acute osseous pathology. Degenerative changes of the spine and shoulders. IMPRESSION: 1. No active disease. 2. Cardiomegaly. Electronically Signed   By: Vanetta Chou M.D.   On: 11/06/2023 17:53   DG Chest Port 1 View Result Date: 11/02/2023 EXAM: 1 VIEW(S) XRAY OF THE CHEST 11/02/2023 11:29:00 PM COMPARISON: 12/21/2022 CLINICAL HISTORY: sob.  Pt c/o dizziness since one day last week with occasional SOB. Pt reports hx  HTN, compliant with medications. FINDINGS: LUNGS AND PLEURA: No focal pulmonary opacity. No pulmonary edema. No pleural effusion. No pneumothorax. HEART AND MEDIASTINUM: Cardiomegaly. No acute abnormality of the mediastinal silhouette. BONES AND SOFT TISSUES: Surgical clips in RIGHT breast. No acute osseous abnormality. JOINTS: Severe degenerative changes of LEFT shoulder. Mild degenerative changes of RIGHT shoulder. IMPRESSION: 1. No acute cardiopulmonary abnormality. 2. Cardiomegaly. Electronically signed by: Norman Gatlin MD 11/02/2023 11:45 PM EDT RP Workstation: [id number]             LOS: 0 days        Laneta Blunt, DO [facility] 11/06/2023, 7:47 PM    Dictation software may have been used to generate the above note. Typos may occur and escape review in typed/dictated notes. Please contact Dr Blunt directly for clarity if needed.  Staff may message me via secure chat in Epic  but this may not receive an immediate response,  please page me for urgent matters!  If 7PM-7AM, please contact night coverage [email or web address]

## 2023-11-06 NOTE — Progress Notes (Signed)
 PHARMACY - ANTICOAGULATION CONSULT NOTE  Pharmacy Consult for Heparin Indication: chest pain/ACS  Allergies  Allergen Reactions   Lisinopril Cough    Patient Measurements:   IBW:57 kg TBW: 120.2 kg Hep dosiing weight: 86kg  Vital Signs: Temp: 97.9 F (36.6 C) (10/22 1644) Temp Source: Oral (10/22 1644) BP: 122/77 (10/22 1700) Pulse Rate: 74 (10/22 1700)  Labs: Recent Labs    11/06/23 1733  HGB 10.8*  HCT 36.1  PLT 222  CREATININE 2.06*    Estimated Creatinine Clearance: 31.6 mL/min (A) (by C-G formula based on SCr of 2.06 mg/dL (H)).   Medical History: Past Medical History:  Diagnosis Date   Arthritis    Atypical ductal hyperplasia of right breast    Breast cancer (HCC) 01/25/2021   Family history of ovarian cancer    Family history of pancreatic cancer    Family history of prostate cancer    Family history of stomach cancer    GERD (gastroesophageal reflux disease)    Hyperlipidemia    Hypertension    Personal history of radiation therapy      Assessment: Active Problem(s): weakness, dizziness - Troponin 1349, LA 3.9, Scr 2 elevated, proBNP>35,000  - Previous ED visit 11/02/23 for same (troponins 46, 32)  AC/Heme: ACS +troponin, baseline Hgb 10.8, Plts 222 No anticoag PTA. Plan IV heparin  Goal of Therapy:  Heparin level 0.3-0.7 units/ml Monitor platelets by anticoagulation protocol: Yes   Plan: Heparin 4000 unit IV bolus Heparin 1200 units/hr Check heparin level in 6-8 hrs Daily HL and CBC   Hasna Stefanik Karoline Marina, PharmD, BCPS Clinical Staff Pharmacist Marina Salines [facility] 11/06/2023,7:22 PM

## 2023-11-06 NOTE — ED Provider Notes (Signed)
 [place] EMERGENCY DEPARTMENT AT [facility] Provider Note   CSN: [id number] Arrival date & time: 11/06/23  1637     Patient presents with: Weakness and Dizziness   Vickie Collier is a 73 y.o. female.    Weakness Associated symptoms: dizziness   Dizziness Associated symptoms: weakness    Patient presents because of weakness.  Dizziness as well.  Patient states that she did have increased weakness over the past week or so.  Family states that they try to get up out of her chair today and could not get up to walk around because of weakness.  In terms of the weakness, patient felt like it came on pretty quick.  Generalized weakness in both upper and lower extremities.  Just feels very fatigued.  Endorses shortness of breath.  Exertional shortness of breath.  No chest pain.  No hemoptysis.  No history of ACS pathology.  Denies any history of DVT or PE.  No pleuritic chest pain..  No specific abdominal pain.  Had episode of diarrhea couple days ago but no subsequent episodes of diarrhea.  No obvious dysuria.  Chronic back pain but no new back pain.  No bowel bladder continence.  No saddle anesthesia.  No documented fevers at home.  Patient's been having vertigo as well.  Seems to happen randomly in nature.  Maybe had episode of diplopia a couple days ago as well.  Previous medical history reviewed : Patient was seen in the ED on November 02, 2023.  Was seen because of leg pain and dizziness.  No lysed weakness.  Laboratory workup unremarkable.  No delta change troponins.     Prior to Admission medications   Medication Sig Start Date End Date Taking? Authorizing Provider  amLODipine (NORVASC) 10 MG tablet Take 10 mg by mouth daily.    [provider]  aspirin EC 81 MG tablet Take 81 mg by mouth daily. Swallow whole.    [provider]  baclofen (LIORESAL) 10 MG tablet Take 10 mg by mouth 2 (two) times daily.    [provider]  cholecalciferol  (VITAMIN D3) 25 MCG (1000 UNIT) tablet Take 1,000 Units by mouth daily.    [provider]  cyanocobalamin 100 MCG tablet Take 100 mcg by mouth daily.    [provider]  felodipine (PLENDIL) 5 MG 24 hr tablet Take 5 mg by mouth daily.    [provider]  letrozole  (FEMARA ) 2.5 MG tablet Take 1 tablet (2.5 mg total) by mouth daily. 02/19/23   Gudena, Vinay, MD  lidocaine  4 % Place 1 patch onto the skin daily. 12/21/22   Neysa Caron PARAS, DO  metoprolol succinate (TOPROL-XL) 50 MG 24 hr tablet Take 50 mg by mouth daily. Take with or immediately following a meal.    [provider]  pregabalin (LYRICA) 150 MG capsule Take 150 mg by mouth 2 (two) times daily.    [provider]  pregabalin (LYRICA) 150 MG capsule Take 1 capsule (150 mg total) by mouth 2 (two) times daily. 11/03/23   Kammerer, Megan L, DO  rosuvastatin (CRESTOR) 5 MG tablet Take 5 mg by mouth daily.    [provider]  valsartan-hydrochlorothiazide (DIOVAN-HCT) 80-12.5 MG tablet Take 1 tablet by mouth daily.    [provider]    Allergies: Lisinopril    Review of Systems  Neurological:  Positive for dizziness and weakness.    Updated Vital Signs BP (!) 152/93   Pulse 68  Temp 97.6 F (36.4 C) (Oral)   Resp (!) 23   SpO2 99%   Physical Exam Vitals and nursing note reviewed.  Constitutional:      General: She is not in acute distress.    Appearance: She is well-developed.  HENT:     Head: Normocephalic and atraumatic.  Eyes:     Conjunctiva/sclera: Conjunctivae normal.  Cardiovascular:     Rate and Rhythm: Normal rate and regular rhythm.     Heart sounds: No murmur heard. Pulmonary:     Effort: Pulmonary effort is normal. No respiratory distress.     Breath sounds: Normal breath sounds.  Abdominal:     Palpations: Abdomen is soft.     Tenderness: There is no abdominal tenderness.  Musculoskeletal:        General: No swelling.     Cervical back: Neck  supple.  Skin:    General: Skin is warm and dry.     Capillary Refill: Capillary refill takes less than 2 seconds.  Neurological:     Mental Status: She is alert.  Psychiatric:        Mood and Affect: Mood normal.     (all labs ordered are listed, but only abnormal results are displayed) Labs Reviewed  PRO BRAIN NATRIURETIC PEPTIDE - Abnormal; Notable for the following components:      Result Value   Pro Brain Natriuretic Peptide >35,000.0 (*)    All other components within normal limits  COMPREHENSIVE METABOLIC PANEL WITH GFR - Abnormal; Notable for the following components:   Glucose, Bld 122 (*)    BUN 26 (*)    Creatinine, Ser 2.06 (*)    AST 47 (*)    GFR, Estimated 25 (*)    All other components within normal limits  CBC WITH DIFFERENTIAL/PLATELET - Abnormal; Notable for the following components:   Hemoglobin 10.8 (*)    MCHC 29.9 (*)    RDW 18.7 (*)    All other components within normal limits  I-STAT CG4 LACTIC ACID, ED - Abnormal; Notable for the following components:   Lactic Acid, Venous 3.9 (*)    All other components within normal limits  I-STAT CG4 LACTIC ACID, ED - Abnormal; Notable for the following components:   Lactic Acid, Venous 2.9 (*)    All other components within normal limits  TROPONIN T, HIGH SENSITIVITY - Abnormal; Notable for the following components:   Troponin T High Sensitivity 1,349 (*)    All other components within normal limits  TROPONIN T, HIGH SENSITIVITY - Abnormal; Notable for the following components:   Troponin T High Sensitivity 1,364 (*)    All other components within normal limits  RESP PANEL BY RT-PCR (RSV, FLU A&B, COVID)  RVPGX2  TSH  URINALYSIS, ROUTINE W REFLEX MICROSCOPIC  HEPARIN LEVEL (UNFRACTIONATED)  CBC  LIPOPROTEIN A (LPA)  BASIC METABOLIC PANEL WITH GFR    EKG: EKG Interpretation Date/Time:  Wednesday November 06 2023 16:44:13 EDT Ventricular Rate:  78 PR Interval:  140 QRS Duration:  103 QT  Interval:  476 QTC Calculation: 543 R Axis:   -23  Text Interpretation: Sinus rhythm Borderline left axis deviation Probable anterior infarct, age indeterminate Abnormal T, consider ischemia, diffuse leads Lateral leads are also involved Prolonged QT interval Confirmed by Simon Rea [phone]) on 11/06/2023 5:21:42 PM  Radiology: [facility] Chest Port 1 View Result Date: 11/06/2023 CLINICAL DATA:  Shortness of breath. EXAM: PORTABLE CHEST 1 VIEW COMPARISON:  Chest radiograph dated 11/02/2023 FINDINGS: No focal  consolidation, pleural effusion or pneumothorax. Stable cardiomegaly. No acute osseous pathology. Degenerative changes of the spine and shoulders. IMPRESSION: 1. No active disease. 2. Cardiomegaly. Electronically Signed   By: Vanetta Chou M.D.   On: 11/06/2023 17:53     Procedures   Medications Ordered in the ED  heparin ADULT infusion 100 units/mL (25000 units/250mL) (1,200 Units/hr Intravenous New Bag/Given 11/06/23 1945)  aspirin EC tablet 81 mg (has no administration in time range)  nitroGLYCERIN (NITROSTAT) SL tablet 0.4 mg (has no administration in time range)  acetaminophen  (TYLENOL ) tablet 650 mg (has no administration in time range)  ondansetron  (ZOFRAN ) injection 4 mg (has no administration in time range)  rosuvastatin (CRESTOR) tablet 10 mg (10 mg Oral Given 11/06/23 2041)  pregabalin (LYRICA) capsule 150 mg (150 mg Oral Given 11/06/23 2040)  cefTRIAXone (ROCEPHIN) 2 g in sodium chloride  0.9 % 100 mL IVPB (0 g Intravenous Stopped 11/06/23 1856)  sodium chloride  0.9 % bolus 500 mL (0 mLs Intravenous Stopped 11/06/23 1913)  aspirin chewable tablet 324 mg (324 mg Oral Given 11/06/23 1912)  heparin bolus via infusion 4,000 Units (4,000 Units Intravenous Bolus from Bag 11/06/23 1943)  furosemide (LASIX) injection 40 mg (40 mg Intravenous Given 11/06/23 2040)    Clinical Course as of 11/06/23 2140  Wed Nov 06, 2023  1906 Aspirin, Heparin - maybe anterlateral MI. Stay at [facility]  long  [TL]    Clinical Course User Index [TL] Simon Lavonia SAILOR, MD                                 Medical Decision Making Amount and/or Complexity of Data Reviewed Labs: ordered. Radiology: ordered.  Risk OTC drugs. Prescription drug management. Decision regarding hospitalization.     HPI:   Patient presents because of weakness.  Dizziness as well.  Patient states that she did have increased weakness over the past week or so.  Family states that they try to get up out of her chair today and could not get up to walk around because of weakness.  In terms of the weakness, patient felt like it came on pretty quick.  Generalized weakness in both upper and lower extremities.  Just feels very fatigued.  Endorses shortness of breath.  Exertional shortness of breath.  No chest pain.  No hemoptysis.  No history of ACS pathology.  Denies any history of DVT or PE.  No pleuritic chest pain..  No specific abdominal pain.  Had episode of diarrhea couple days ago but no subsequent episodes of diarrhea.  No obvious dysuria.  Chronic back pain but no new back pain.  No bowel bladder continence.  No saddle anesthesia.  No documented fevers at home.  Patient's been having vertigo as well.  Seems to happen randomly in nature.  Maybe had episode of diplopia a couple days ago as well.  Previous medical history reviewed : Patient was seen in the ED on November 02, 2023.  Was seen because of leg pain and dizziness.  No lysed weakness.  Laboratory workup unremarkable.  No delta change troponins   MDM:   Upon exam, patient ANO x 3 GCS 15.  No focal deficits.  Cranials 2-12 intact.  NIH is 0.  In terms of vertigo, unclear etiology of this at this time.   Obtain laboratory workup to assess for, AKI or large electrolyte derangements.  Also obtain CBC to rule out any kind of significant leukocytosis  or anemia.  TSH because of fatigue. UA for infectious workup.  EKG shows some anterolateral T wave inversions.   Therefore we will obtain troponin workup and ACS workup.  No current chest pain.    Reevaluation:   Patient quiring about 1 L nasal cannula.  Initially, patient had a positive lactic acid.  In the setting of her O2 requirement, started small dose ceftriaxone to cover for any kind of pneumonia and/or UTI.  Also gave patient small dose of 500 cc bolus of fluid initially given concern for possible infectious etiology.  Patient's chest x-ray showed cardiomegaly.  Troponin came back at over thousand with BNP elevated greater than 35,000.  Likely commendation of CHF as well as recent cardiac event to explain the troponin leak.  I consulted cardiology.  Recommended both aspirin load as well as heparin drip.  They will see in the morning.  Recommended echo.  No indication for emergent catheterization.  Patient admitted to medicine for further care.  Interventions: Heparin, Aspirin  EKG Interpreted by Me: T wave inversion anterolateral leads    Cardiac Tele Interpreted by Me: Sinus    I have independently interpreted the CXR  \  Social Determinant of Health: None   Disposition and Follow Up: admission    CRITICAL CARE Performed by: Lavonia LOISE Pat   Total critical care time: 45 minutes  Critical care time was exclusive of separately billable procedures and treating other patients.  Critical care was necessary to treat or prevent imminent or life-threatening deterioration.  Critical care was time spent personally by me on the following activities: development of treatment plan with patient and/or surrogate as well as nursing, discussions with consultants, evaluation of patient's response to treatment, examination of patient, obtaining history from patient or surrogate, ordering and performing treatments and interventions, ordering and review of laboratory studies, ordering and review of radiographic studies, pulse oximetry and re-evaluation of patient's condition.       Final  diagnoses:  NSTEMI (non-ST elevated myocardial infarction) (HCC)  Fatigue, unspecified type  Shortness of breath  Troponin level elevated    ED Discharge Orders     None          Pat Lavonia LOISE, MD 11/06/23 2140

## 2023-11-07 ENCOUNTER — Inpatient Hospital Stay (HOSPITAL_COMMUNITY)

## 2023-11-07 DIAGNOSIS — R079 Chest pain, unspecified: Secondary | ICD-10-CM

## 2023-11-07 DIAGNOSIS — N179 Acute kidney failure, unspecified: Secondary | ICD-10-CM | POA: Diagnosis not present

## 2023-11-07 DIAGNOSIS — I214 Non-ST elevation (NSTEMI) myocardial infarction: Secondary | ICD-10-CM | POA: Diagnosis not present

## 2023-11-07 DIAGNOSIS — I5082 Biventricular heart failure: Secondary | ICD-10-CM

## 2023-11-07 DIAGNOSIS — R570 Cardiogenic shock: Secondary | ICD-10-CM

## 2023-11-07 DIAGNOSIS — I249 Acute ischemic heart disease, unspecified: Secondary | ICD-10-CM | POA: Diagnosis not present

## 2023-11-07 LAB — TROPONIN T, HIGH SENSITIVITY
Troponin T High Sensitivity: 2175 ng/L (ref 0–19)
Troponin T High Sensitivity: 2579 ng/L (ref 0–19)

## 2023-11-07 LAB — COMPREHENSIVE METABOLIC PANEL WITH GFR
ALT: 15 U/L (ref 0–44)
AST: 66 U/L — ABNORMAL HIGH (ref 15–41)
Albumin: 3.2 g/dL — ABNORMAL LOW (ref 3.5–5.0)
Alkaline Phosphatase: 48 U/L (ref 38–126)
Anion gap: 15 (ref 5–15)
BUN: 32 mg/dL — ABNORMAL HIGH (ref 8–23)
CO2: 19 mmol/L — ABNORMAL LOW (ref 22–32)
Calcium: 8.8 mg/dL — ABNORMAL LOW (ref 8.9–10.3)
Chloride: 109 mmol/L (ref 98–111)
Creatinine, Ser: 2.98 mg/dL — ABNORMAL HIGH (ref 0.44–1.00)
GFR, Estimated: 16 mL/min — ABNORMAL LOW (ref >60)
Glucose, Bld: 93 mg/dL (ref 70–99)
Potassium: 4.8 mmol/L (ref 3.5–5.1)
Sodium: 143 mmol/L (ref 135–145)
Total Bilirubin: 0.6 mg/dL (ref 0.0–1.2)
Total Protein: 7.5 g/dL (ref 6.5–8.1)

## 2023-11-07 LAB — BASIC METABOLIC PANEL WITH GFR
Anion gap: 13 (ref 5–15)
BUN: 29 mg/dL — ABNORMAL HIGH (ref 8–23)
CO2: 22 mmol/L (ref 22–32)
Calcium: 8.6 mg/dL — ABNORMAL LOW (ref 8.9–10.3)
Chloride: 110 mmol/L (ref 98–111)
Creatinine, Ser: 2.36 mg/dL — ABNORMAL HIGH (ref 0.44–1.00)
GFR, Estimated: 21 mL/min — ABNORMAL LOW (ref >60)
Glucose, Bld: 96 mg/dL (ref 70–99)
Potassium: 4.1 mmol/L (ref 3.5–5.1)
Sodium: 146 mmol/L — ABNORMAL HIGH (ref 135–145)

## 2023-11-07 LAB — CBC
HCT: 30.9 % — ABNORMAL LOW (ref 36.0–46.0)
Hemoglobin: 9.1 g/dL — ABNORMAL LOW (ref 12.0–15.0)
MCH: 26 pg (ref 26.0–34.0)
MCHC: 29.4 g/dL — ABNORMAL LOW (ref 30.0–36.0)
MCV: 88.3 fL (ref 80.0–100.0)
Platelets: 179 K/uL (ref 150–400)
RBC: 3.5 MIL/uL — ABNORMAL LOW (ref 3.87–5.11)
RDW: 18.8 % — ABNORMAL HIGH (ref 11.5–15.5)
WBC: 7.4 K/uL (ref 4.0–10.5)
nRBC: 0 % (ref 0.0–0.2)

## 2023-11-07 LAB — URINALYSIS, ROUTINE W REFLEX MICROSCOPIC
Bilirubin Urine: NEGATIVE
Glucose, UA: NEGATIVE mg/dL
Hgb urine dipstick: NEGATIVE
Ketones, ur: 5 mg/dL — AB
Nitrite: NEGATIVE
Protein, ur: 100 mg/dL — AB
Specific Gravity, Urine: 1.03 (ref 1.005–1.030)
WBC, UA: >50 WBC/hpf (ref 0–5)
pH: 5 (ref 5.0–8.0)

## 2023-11-07 LAB — LIPID PANEL
Cholesterol: 166 mg/dL (ref 0–200)
HDL: 45 mg/dL (ref >40)
LDL Cholesterol: 96 mg/dL (ref 0–99)
Total CHOL/HDL Ratio: 3.7 ratio
Triglycerides: 125 mg/dL (ref <150)
VLDL: 25 mg/dL (ref 0–40)

## 2023-11-07 LAB — GLUCOSE, CAPILLARY: Glucose-Capillary: 88 mg/dL (ref 70–99)

## 2023-11-07 LAB — ECHOCARDIOGRAM COMPLETE
AR max vel: 1.67 cm2
AV Area VTI: 1.41 cm2
AV Area mean vel: 1.62 cm2
AV Mean grad: 4 mmHg
AV Peak grad: 6.7 mmHg
Ao pk vel: 1.29 m/s
Area-P 1/2: 2.11 cm2
S' Lateral: 3.2 cm

## 2023-11-07 LAB — PROCALCITONIN: Procalcitonin: 0.43 ng/mL

## 2023-11-07 LAB — HEMOGLOBIN A1C
Hgb A1c MFr Bld: 5 % (ref 4.8–5.6)
Mean Plasma Glucose: 96.8 mg/dL

## 2023-11-07 LAB — MAGNESIUM: Magnesium: 2 mg/dL (ref 1.7–2.4)

## 2023-11-07 LAB — HEPARIN LEVEL (UNFRACTIONATED)
Heparin Unfractionated: >1.1 [IU]/mL — ABNORMAL HIGH (ref 0.30–0.70)
Heparin Unfractionated: >1.1 [IU]/mL — ABNORMAL HIGH (ref 0.30–0.70)
Heparin Unfractionated: >1.1 [IU]/mL — ABNORMAL HIGH (ref 0.30–0.70)

## 2023-11-07 LAB — LACTIC ACID, PLASMA
Lactic Acid, Venous: 3.2 mmol/L (ref 0.5–1.9)
Lactic Acid, Venous: 1.2 mmol/L (ref 0.5–1.9)

## 2023-11-07 LAB — COOXEMETRY PANEL
Carboxyhemoglobin: 1.4 % (ref 0.5–1.5)
Carboxyhemoglobin: 1.1 % (ref 0.5–1.5)
Methemoglobin: 1.1 % (ref 0.0–1.5)
Methemoglobin: <0.7 % (ref 0.0–1.5)
O2 Saturation: 41.7 %
O2 Saturation: 55.4 %
Total hemoglobin: 10.4 g/dL — ABNORMAL LOW (ref 12.0–16.0)
Total hemoglobin: 10.6 g/dL — ABNORMAL LOW (ref 12.0–16.0)

## 2023-11-07 LAB — TROPONIN I (HIGH SENSITIVITY)
Troponin I (High Sensitivity): 8901 ng/L (ref <18)
Troponin I (High Sensitivity): 8860 ng/L (ref <18)

## 2023-11-07 LAB — MRSA NEXT GEN BY PCR, NASAL: MRSA by PCR Next Gen: NOT DETECTED

## 2023-11-07 MED ORDER — AMLODIPINE BESYLATE 5 MG PO TABS: 10.0000 mg | ORAL_TABLET | Freq: Every day | ORAL | Status: DC

## 2023-11-07 MED ORDER — SODIUM CHLORIDE 0.9 % IV SOLN: 250.0000 mL | INTRAVENOUS | Status: AC

## 2023-11-07 MED ORDER — POTASSIUM CHLORIDE 10 MEQ/100ML IV SOLN
10.0000 meq | INTRAVENOUS | Status: DC
Start: 2023-11-07 — End: 2023-11-07

## 2023-11-07 MED ORDER — SODIUM CHLORIDE 0.9% FLUSH: 3.0000 mL | INTRAVENOUS | Status: DC | PRN

## 2023-11-07 MED ORDER — PANTOPRAZOLE SODIUM 40 MG PO TBEC
40.0000 mg | DELAYED_RELEASE_TABLET | Freq: Every day | ORAL | Status: DC
  Administered 2023-11-07 – 2023-11-15 (×9): 40 mg via ORAL
  Filled 2023-11-07 (×9): qty 1

## 2023-11-07 MED ORDER — SODIUM CHLORIDE 0.9 % IV SOLN: 250.0000 mL | INTRAVENOUS | Status: DC | PRN

## 2023-11-07 MED ORDER — POTASSIUM CHLORIDE 20 MEQ PO PACK
40.0000 meq | PACK | Freq: Once | ORAL | Status: DC
Start: 2023-11-07 — End: 2023-11-07

## 2023-11-07 MED ORDER — SODIUM CHLORIDE 0.9% FLUSH
3.0000 mL | Freq: Two times a day (BID) | INTRAVENOUS | Status: DC
  Administered 2023-11-07 – 2023-11-08 (×2): 3 mL via INTRAVENOUS

## 2023-11-07 MED ORDER — ATORVASTATIN CALCIUM 40 MG PO TABS
40.0000 mg | ORAL_TABLET | Freq: Every evening | ORAL | Status: DC
  Administered 2023-11-07 – 2023-11-08 (×2): 40 mg via ORAL
  Filled 2023-11-07 (×2): qty 1

## 2023-11-07 MED ORDER — NOREPINEPHRINE 4 MG/250ML-% IV SOLN
0.0000 ug/min | INTRAVENOUS | Status: DC
  Administered 2023-11-08: 2 ug/min via INTRAVENOUS
  Filled 2023-11-07: qty 250

## 2023-11-07 MED ORDER — SODIUM CHLORIDE 0.9 % IV SOLN
1.0000 g | INTRAVENOUS | Status: DC
  Administered 2023-11-07: 1 g via INTRAVENOUS
  Filled 2023-11-07: qty 10

## 2023-11-07 MED ORDER — HEPARIN (PORCINE) 25000 UT/250ML-% IV SOLN
650.0000 [IU]/h | INTRAVENOUS | Status: DC
  Administered 2023-11-07: 800 [IU]/h via INTRAVENOUS

## 2023-11-07 MED ORDER — HEPARIN (PORCINE) 25000 UT/250ML-% IV SOLN
1000.0000 [IU]/h | INTRAVENOUS | Status: DC
  Administered 2023-11-07: 1000 [IU]/h via INTRAVENOUS
  Filled 2023-11-07: qty 250

## 2023-11-07 MED ORDER — CHLORHEXIDINE GLUCONATE CLOTH 2 % EX PADS
6.0000 | MEDICATED_PAD | Freq: Every day | CUTANEOUS | Status: DC
Start: 2023-11-07 — End: 2023-11-15
  Administered 2023-11-07 – 2023-11-15 (×9): 6 via TOPICAL

## 2023-11-07 MED ORDER — CARVEDILOL 3.125 MG PO TABS: 6.2500 mg | ORAL_TABLET | Freq: Two times a day (BID) | ORAL | Status: DC

## 2023-11-07 MED ORDER — MILRINONE LACTATE IN DEXTROSE 20-5 MG/100ML-% IV SOLN
0.1250 ug/kg/min | INTRAVENOUS | Status: DC
  Administered 2023-11-07 – 2023-11-09 (×4): 0.25 ug/kg/min via INTRAVENOUS
  Administered 2023-11-10 (×2): 0.125 ug/kg/min via INTRAVENOUS
  Filled 2023-11-07 (×7): qty 100

## 2023-11-07 NOTE — ED Notes (Signed)
 Unable to obtain labs and culltures due to limited access, MD team made aware.

## 2023-11-07 NOTE — Progress Notes (Signed)
 PIV consult: Limited venous access options and vasopressor ordered. Please consider central line for infusions and labs.

## 2023-11-07 NOTE — ED Notes (Signed)
 Pt reports pain and pointed to the center of her chest, EKG obtained, [place], MD made aware via secure chat. EKG read by ED doc Elnor.

## 2023-11-07 NOTE — Consult Note (Signed)
 Cardiology Consultation   Patient ID: Vickie Collier MRN: [id number]; DOB: Sep 28, 1950  Admit date: 11/06/2023 Date of Consult: 11/07/2023  PCP:  Benjamine Aland, MD   [facility] Providers Cardiologist:  None        Patient Profile: Vickie Collier is a 73 y.o. female with a hx of hypertension, hyperlipidemia, breast cancer, GERD, and arthritis who is being seen 11/07/2023 for the evaluation of possible ACS at the request of Eric [place] DO.  History of Present Illness: Ms. Kitt has no prior cardiac history. Has never had an MI. No history of T2DM. No tobacco use. Denied illicit drug use.   On 1118 patient had presented to [facility] for dizziness that have been ongoing for a week and occasional shortness of breath.  At that time EKG showed new TWI.  Troponin 46 -> 32.  She was recommended admission for further workup and management, though family insisted on going home.  Does not appear that the patient had a follow-up appointment though advised to have close follow-up.  Presented yesterday for ongoing weakness and dizziness.  Family was unable to get her out of the chair due to her weakness.  Decided to come into the ED. BP 122/77      HR 74  afebrile ECG sinus rhythm, LAD, poor R wave progression, global TWI VR 78 CXR cardiomegaly no evidence of volume overload Pertinent lab work: N 144 -> 146    Cr 2.06-> 2.36 [~1,4 on 10/18]     AST 47 Lactic acid 3.9 -> 2.9 Pro-BNP >35,000   hs troponin 1349-> 1364 -> 2175  UA suggesting possible UTI  10/23: 1 ECG atrial rhythm with PVC, resolution of LAD and TWI in lateral leads VR 51 [TEI in V2/V3 deepening] 2 ECG converted back to sinus rhythm with ^ same changes VR 55  Was given aspirin load and started on IV heparin, antibiotics, and fluids.  Given 1 dose of IV Lasix 40 mg. Inaccurate I/Os recorded.  On interview, patient shared she has had DOE for about a month. Denied orthopnea, PND, and peripheral edema. Denied anginal  chest pain.  Noticed she was having weakness/ hot flashes which prompted the visit on 10/18. She continued to have these symptoms, then yesterday had an abrupt onset of severe weakness with lightheadedness, dizziness,shortness of breath at rest and palpitations. This has never happened before, denied syncope. This morning started developing chest pain, centrally located without radiation. Described as stabbing 8/10.  Denied fever, chills, or systemic infectious symptoms.   Shared she has been taking amlodipione, flenodipine, metoprolol, and valsartan-hydrochlorothiazide for her bp outpatient.    Past Medical History:  Diagnosis Date   Arthritis    Atypical ductal hyperplasia of right breast    Breast cancer (HCC) 01/25/2021   Family history of ovarian cancer    Family history of pancreatic cancer    Family history of prostate cancer    Family history of stomach cancer    GERD (gastroesophageal reflux disease)    Hyperlipidemia    Hypertension    Personal history of radiation therapy     Past Surgical History:  Procedure Laterality Date   ABDOMINAL HYSTERECTOMY     BREAST LUMPECTOMY     JOINT REPLACEMENT Bilateral    TKR   RADIOACTIVE SEED GUIDED EXCISIONAL BREAST BIOPSY Right 01/25/2021   Procedure: RADIOACTIVE SEED GUIDED EXCISIONAL RIGHT BREAST BIOPSY;  Surgeon: Aron Shoulders, MD;  Location: [facility];  Service: General;  Laterality: Right;  RE-EXCISION OF BREAST LUMPECTOMY Right 02/14/2021   Procedure: RE-EXCISION OF RIGHT BREAST LUMPECTOMY;  Surgeon: Aron Shoulders, MD;  Location: [facility];  Service: General;  Laterality: Right;       Scheduled Meds:  aspirin EC  81 mg Oral Daily   carvedilol  6.25 mg Oral BID WC   pantoprazole  40 mg Oral Daily   pregabalin  150 mg Oral BID   rosuvastatin  10 mg Oral Daily   Continuous Infusions:  heparin 1,200 Units/hr (11/06/23 1945)   PRN Meds: acetaminophen , nitroGLYCERIN, ondansetron  (ZOFRAN )  IV  Allergies:    Allergies  Allergen Reactions   Lisinopril Cough    Social History:   Social History   Socioeconomic History   Marital status: Single    Spouse name: Not on file   Number of children: Not on file   Years of education: Not on file   Highest education level: Not on file  Occupational History   Not on file  Tobacco Use   Smoking status: Never   Smokeless tobacco: Never  Substance and Sexual Activity   Alcohol use: Not Currently   Drug use: Never   Sexual activity: Not Currently    Birth control/protection: Surgical  Other Topics Concern   Not on file  Social History Narrative   Not on file   Social Drivers of Health   Financial Resource Strain: Low Risk  (02/10/2019)   Received from [facility]   Overall Financial Resource Strain (CARDIA)    Difficulty of Paying Living Expenses: Not hard at all  Food Insecurity: No Food Insecurity (02/10/2019)   Received from [facility]   Hunger Vital Sign    Within the past 12 months, you worried that your food would run out before you got the money to buy more.: Never true    Within the past 12 months, the food you bought just didn't last and you didn't have money to get more.: Never true  Transportation Needs: No Transportation Needs (02/10/2019)   Received from [facility]   PRAPARE - Administrator, Civil Service (Medical): No    Lack of Transportation (Non-Medical): No  Physical Activity: Insufficiently Active (08/28/2017)   Received from [facility]   Exercise Vital Sign    Days of Exercise per Week: 7 days    Minutes of Exercise per Session: 20 min  Stress: No Stress Concern Present (08/28/2017)   Received from [facility]   [employer] of Occupational Health - Occupational Stress Questionnaire    Feeling of Stress : Not at all  Social Connections: Unknown (04/02/2018)   Received  from [facility]   Social Connection and Isolation Panel    Frequency of Communication with Friends and Family: Not on file    Frequency of Social Gatherings with Friends and Family: Not on file    Attends Religious Services: Not on file    Active Member of Clubs or Organizations: Not on file    Attends Banker Meetings: Not on file    Are you married, widowed, divorced, separated, never married, or living with a partner?: Divorced  Intimate Partner Violence: Unknown (02/23/2021)   Humiliation, Afraid, Rape, and Kick questionnaire    Fear of Current or Ex-Partner: No    Emotionally Abused: No    Physically Abused: Not on file    Sexually Abused: No  Family History:   Family History  Problem Relation Age of Onset   Lupus Father    Ovarian cancer Maternal Grandmother 55   Prostate cancer Half-Brother 109   Pancreatic cancer Cousin        paternal first cousin   Stomach cancer Cousin        paternal first cousin   Lung cancer Cousin    Cancer Cousin        NOS     ROS:  Please see the history of present illness.  All other ROS reviewed and negative.     Physical Exam/Data: Vitals:   11/07/23 0130 11/07/23 0306 11/07/23 0533 11/07/23 0723  BP: 102/61  112/67 94/70  Pulse: (!) 50  (!) 52 (!) 55  Resp: 18  18 18   Temp:  98 F (36.7 C)  98 F (36.7 C)  TempSrc:  Oral  Oral  SpO2: 100%  95% 100%    Intake/Output Summary (Last 24 hours) at 11/07/2023 0814 Last data filed at 11/06/2023 1856 Gross per 24 hour  Intake 100 ml  Output --  Net 100 ml      11/02/2023    6:31 PM 02/19/2023    3:30 PM 12/14/2021    1:27 PM  Last 3 Weights  Weight (lbs) 265 lb 252 lb 251 lb 4.8 oz  Weight (kg) 120.203 kg 114.306 kg 113.989 kg     There is no height or weight on file to calculate BMI.  General:  Well nourished, well developed, in no acute distress HEENT: normal Neck: mild JVD though hard to assess Vascular: Distal pulses 1+  bilaterally Cardiac:  normal S1, S2; RRR; no murmur  Lungs:  Occasional wheezing, though no rhonchi or rales Abd: soft, nontender, no hepatomegaly  Ext: no edema Musculoskeletal:  No deformities, BUE and BLE strength normal and equal Skin: warm and dry  Neuro:  CNs 2-12 intact, no focal abnormalities noted Psych:  Normal affect   EKG:  The EKG was personally reviewed and demonstrates:  see hpi Telemetry:  Telemetry was personally reviewed and demonstrates:  sinus rhythm, bradycardic HR 55. TWI. Freqeunt PVC.   Relevant CV Studies: None  Laboratory Data: Chemistry Recent Labs  Lab 11/02/23 1843 11/06/23 1733 11/07/23 0546  NA 145 144 146*  K 3.5 4.3 4.1  CL 102 107 110  CO2 21* 24 22  GLUCOSE 93 122* 96  BUN 14 26* 29*  CREATININE 1.42* 2.06* 2.36*  CALCIUM 10.3 9.4 8.6*  GFRNONAA 39* 25* 21*  ANIONGAP 22* 14 13    Recent Labs  Lab 11/02/23 1843 11/06/23 1733  PROT 8.9* 7.8  ALBUMIN 4.2 3.7  AST 21 47*  ALT 6 6  ALKPHOS 71 65  BILITOT 1.1 0.5   Hematology Recent Labs  Lab 11/02/23 1843 11/06/23 1733 11/07/23 0546  WBC 6.1 7.9 7.4  RBC 4.19 4.16 3.50*  HGB 10.8* 10.8* 9.1*  HCT 34.3* 36.1 30.9*  MCV 81.9 86.8 88.3  MCH 25.8* 26.0 26.0  MCHC 31.5 29.9* 29.4*  RDW 17.6* 18.7* 18.8*  PLT 254 222 179   Thyroid  Recent Labs  Lab 11/06/23 1733  TSH 2.230    BNP Recent Labs  Lab 11/06/23 1733  PROBNP >35,000.0*     Radiology/Studies:  DG Chest Port 1 View Result Date: 11/06/2023 CLINICAL DATA:  Shortness of breath. EXAM: PORTABLE CHEST 1 VIEW COMPARISON:  Chest radiograph dated 11/02/2023 FINDINGS: No focal consolidation, pleural effusion or pneumothorax. Stable cardiomegaly. No acute osseous  pathology. Degenerative changes of the spine and shoulders. IMPRESSION: 1. No active disease. 2. Cardiomegaly. Electronically Signed   By: Vanetta Chou M.D.   On: 11/06/2023 17:53     Assessment and Plan:  NSTEMI AKI [Cr 2.06-> 2.36 ] Lactic  Acidosis [ 3.9 -> 2.9 ] Symptoms include weakness, lightheadedness, dizziness, palpitations, and shortness of breath. Developed chest pain this morning, has not tried nitroglycerin for relief.  ECG with almost global TWI  Troponins and Pro-BNP as above. Echocardiogram pending  Patient presented on 10/18 for weakness, at that time she was advised to be admitted for new ECG changes and mildly elevated troponins. She was not having chest pain at that time, and patient went home. She has now re-presented with worsening symptoms and evidence of ongoing myocardial damage concerning for ongoing ischemia. She now has chest pain. With significant and worsening AKI not ideal to go to cath lab right now. Would like to medically optimize her and obtain more data. Will have RN administer nitroglycerin for chest pain, will follow up if symptoms improve. Her BP has been on the softer side, PTA BP medications discontinued. On exam she appears mostly euvolemic, though with new oxygen requirement may benefit from diuresis. Will follow up on echo results and blood pressure after nitroglycerin administration prior to diuresis  Continue IV heparin Continue ASA 81 mg Coreg on hold for BP  Hypertension  BP: 94/70 Patient is on two CCB at home along with multiple other medications, will need optimization prior to discharge.  Continue to hold PTA medications for now, when able would initiate BB first  Hyperlipidemia Lipid panel and lipoprotein (a) pending Continue crestor 10 mg  Per primary  AKI Lactic acidosis UTI Arthritis Breast cancer   Risk Assessment/Risk Scores:    TIMI Risk Score for Unstable Angina or Non-ST Elevation MI:   The patient's TIMI risk score is 4, which indicates a 20% risk of all cause mortality, new or recurrent myocardial infarction or need for urgent revascularization in the next 14 days.         For questions or updates, please contact [facility] Please consult  [email or web address] for contact info under      Signed, Leontine LOISE Salen, PA-C  11/07/2023 8:14 AM

## 2023-11-07 NOTE — Progress Notes (Signed)
 PHARMACY - ANTICOAGULATION CONSULT NOTE  Pharmacy Consult for heparin Indication: chest pain/ACS  Allergies  Allergen Reactions   Lisinopril Cough    Patient Measurements:   Heparin dosing weight: 86 kg  Vital Signs: Temp: 98 F (36.7 C) (10/23 2000) Temp Source: Oral (10/23 2000) BP: 73/64 (10/23 1700) Pulse Rate: 52 (10/23 1700)  Labs: Recent Labs    11/06/23 1733 11/07/23 0545 11/07/23 0546 11/07/23 0750 11/07/23 1508 11/07/23 1647 11/07/23 2028  HGB 10.8*  --  9.1*  --   --   --   --   HCT 36.1  --  30.9*  --   --   --   --   PLT 222  --  179  --   --   --   --   HEPARINUNFRC  --  >1.10*  --  >1.10*  --   --  >1.10*  CREATININE 2.06*  --  2.36*  --  2.98*  --   --   TROPONINIHS  --   --   --   --  8,901* 1,139*  --     Estimated Creatinine Clearance: 21.8 mL/min (A) (by C-G formula based on SCr of 2.98 mg/dL (H)).   Medical History: Past Medical History:  Diagnosis Date   Arthritis    Atypical ductal hyperplasia of right breast    Breast cancer (HCC) 01/25/2021   Family history of ovarian cancer    Family history of pancreatic cancer    Family history of prostate cancer    Family history of stomach cancer    GERD (gastroesophageal reflux disease)    Hyperlipidemia    Hypertension    Personal history of radiation therapy     Medications: No anticoagulants PTA  Assessment: Pt is a 42 yoF admitted for cardiogenic shock. Pharmacy consulted for heparin dosing for NSTEMI.  PM: heparin level > 1.1 on heparin 1000 units/hr. Confirmed with RN that level was drawn appropriately (lab through [facility], heparin running peripherally)  Goal of Therapy:  Heparin level 0.3-0.7 units/ml Monitor platelets by anticoagulation protocol: Yes   Plan:  Hold heparin infusion for two hours Decrease rate of heparin to 800 units/hr Check 8 hour heparin level CBC, heparin level daily Monitor for signs of bleeding  Rocky Slade, PharmD, BCPS 11/07/2023,9:29  PM  Please check AMION for all [facility] Pharmacy phone numbers After 10:00 PM, call Main Pharmacy [phone]

## 2023-11-07 NOTE — Consult Note (Addendum)
 NAME:  Vickie Collier, MRN:  [id number], DOB:  [birth year], LOS: 1 ADMISSION DATE:  11/06/2023, CONSULTATION DATE:  11/07/23 REFERRING MD:  [place], CHIEF COMPLAINT:   weakness  // consulted for cardiogenic shock   History of Present Illness:  73 yo F PMH HTN HLD breast ca on letrozole  presented to [facility] ED 11/06/23 w CC weakness x1wk + associated SOB, no dizziness, repots some exertional CP and having to sleep sitting up in the recliner   Notably, Was seen in the ED recently  (10/18) w knee pain and dizziness, at that time had an EKG w new T wave inversion and was rec admission/obvs but pt declined and went home instead .  In ED 10/22 elevated trop 1349, markedly elevated pro BNP >35k. Cards engaged by ED and was Started on hep gtt and ASA load.  Admitted to [facility]  On 10/23 pt seen by cards. Pt w ongoing rising trops and progressive hypotension. ECHO w LVEF 35-40% g1 diastolic failure, septal flattening, severely reduced RV failure, moderate RV enlargement and moderately elevated PASP   + Severe TR, mild AS   PCCM consulted in this setting   Pertinent  Medical History  HLD  HTN  Significant Hospital Events: Including procedures, antibiotic start and stop dates in addition to other pertinent events   10/18 ED w dizziness, new EKG changes admission recommended but pt refused 10/22 ED w weakness. Markedly elevated BNP and elevated trop and elevated LA. Hep gtt asa for NSTEMI, [facility] admit 10/23 incr trops, echo w biv failure, seen by cards. Progressive hypotension, pccm consult   Interim History / Subjective:  Seen by cards  Transferred to [facility], arrived stable off pressors   Objective    Blood pressure (!) 110/43, pulse (!) 54, temperature 98.2 F (36.8 C), temperature source Oral, resp. rate (!) 24, SpO2 93%.        Intake/Output Summary (Last 24 hours) at 11/07/2023 1233 Last data filed at 11/06/2023 1856 Gross per 24 hour  Intake 100 ml  Output --  Net 100 ml   There  were no vitals filed for this visit.  General:  well nourished F resting in bed awake and in NAD  HEENT: MM pink/moist, no JVD Neuro: alert and oriented  CV: s1s2 rrr, no m/r/g PULM:  mildly diminished in the bilateral bases without rhonchi and no distress on [place] GI: soft, non-distended  Extremities: cool/dry, 1+  edema     Resolved problem list   Assessment and Plan   ACS Concern for RV infarct Acute BiV HF Cardiogenic shock  Lactic acidosis  -ECHO w LVEF 25%, severely reduced RV systolic function + RV dilation. Septal flattening, severe TR.   -PE a consideration as well and has not been ruled out, but felt less likely  -trops rising, now 2579. Pro BNP > 35000  P -txf to [facility] CVICU -NE for MAP > 65 -will need CVC  -coox, cvp  when central access established  -pending above, expect will need diuresis  -trend LA PRN  -cont hep gtt, ASA  -adv HF aware of pt and will see at [facility], suspect will need cath  AKI on CKD 3b -looks like baseline gfr about 30 -no documented UOP 10/22 after 40 lasix, nor on 10/23 so far  P -strict I/O, bladder scan  -follow renal indices uop -- will plan for CMP this afternoon, BMP in AM    HTN HLD -holding home antiHTN -cont crestor   Breast cancer  s/p resection + radiation, on letrozole  -follows w dr odean  - restart home letrozole  when appropriate   Labs   CBC: Recent Labs  Lab 11/02/23 1843 11/06/23 1733 11/07/23 0546  WBC 6.1 7.9 7.4  NEUTROABS 5.0 6.2  --   HGB 10.8* 10.8* 9.1*  HCT 34.3* 36.1 30.9*  MCV 81.9 86.8 88.3  PLT 254 222 179    Basic Metabolic Panel: Recent Labs  Lab 11/02/23 1843 11/06/23 1733 11/07/23 0546  NA 145 144 146*  K 3.5 4.3 4.1  CL 102 107 110  CO2 21* 24 22  GLUCOSE 93 122* 96  BUN 14 26* 29*  CREATININE 1.42* 2.06* 2.36*  CALCIUM 10.3 9.4 8.6*   GFR: Estimated Creatinine Clearance: 27.6 mL/min (A) (by C-G formula based on SCr of 2.36 mg/dL (H)). Recent Labs  Lab 11/02/23 1843  11/06/23 1733 11/06/23 1741 11/06/23 1954 11/07/23 0546 11/07/23 0800  PROCALCITON  --   --   --   --   --  0.43  WBC 6.1 7.9  --   --  7.4  --   LATICACIDVEN  --   --  3.9* 2.9*  --   --     Liver Function Tests: Recent Labs  Lab 11/02/23 1843 11/06/23 1733  AST 21 47*  ALT 6 6  ALKPHOS 71 65  BILITOT 1.1 0.5  PROT 8.9* 7.8  ALBUMIN 4.2 3.7   No results for input(s): LIPASE, AMYLASE in the last 168 hours. No results for input(s): AMMONIA in the last 168 hours.  ABG No results found for: PHART, PCO2ART, PO2ART, HCO3, TCO2, ACIDBASEDEF, O2SAT   Coagulation Profile: No results for input(s): INR, PROTIME in the last 168 hours.  Cardiac Enzymes: Recent Labs  Lab 11/02/23 1843  CKTOTAL 84    HbA1C: No results found for: HGBA1C  CBG: Recent Labs  Lab 11/02/23 1834  GLUCAP 99    Review of Systems:   Please see the history of present illness. All other systems reviewed and are negative     Past Medical History:  She,  has a past medical history of Arthritis, Atypical ductal hyperplasia of right breast, Breast cancer (HCC) (01/25/2021), Family history of ovarian cancer, Family history of pancreatic cancer, Family history of prostate cancer, Family history of stomach cancer, GERD (gastroesophageal reflux disease), Hyperlipidemia, Hypertension, and Personal history of radiation therapy.   Surgical History:   Past Surgical History:  Procedure Laterality Date   ABDOMINAL HYSTERECTOMY     BREAST LUMPECTOMY     JOINT REPLACEMENT Bilateral    TKR   RADIOACTIVE SEED GUIDED EXCISIONAL BREAST BIOPSY Right 01/25/2021   Procedure: RADIOACTIVE SEED GUIDED EXCISIONAL RIGHT BREAST BIOPSY;  Surgeon: Aron Shoulders, MD;  Location: [facility];  Service: General;  Laterality: Right;   RE-EXCISION OF BREAST LUMPECTOMY Right 02/14/2021   Procedure: RE-EXCISION OF RIGHT BREAST LUMPECTOMY;  Surgeon: Aron Shoulders, MD;  Location: [facility];  Service: General;  Laterality: Right;     Social History:   reports that she has never smoked. She has never used smokeless tobacco. She reports that she does not currently use alcohol. She reports that she does not use drugs.   Family History:  Her family history includes Cancer in her cousin; Lung cancer in her cousin; Lupus in her father; Ovarian cancer (age of onset: 52) in her maternal grandmother; Pancreatic cancer in her cousin; Prostate cancer (age of onset: 84) in her half-brother; Stomach cancer in her cousin.  Allergies Allergies  Allergen Reactions   Lisinopril Cough     Home Medications  Prior to Admission medications   Medication Sig Start Date End Date Taking? Authorizing Provider  amLODipine (NORVASC) 10 MG tablet Take 10 mg by mouth daily.   Yes [provider]  baclofen (LIORESAL) 10 MG tablet Take 10 mg by mouth 2 (two) times daily.   Yes [provider]  cyanocobalamin 100 MCG tablet Take 100 mcg by mouth daily.   Yes [provider]  felodipine (PLENDIL) 5 MG 24 hr tablet Take 5 mg by mouth daily.   Yes [provider]  letrozole  (FEMARA ) 2.5 MG tablet Take 1 tablet (2.5 mg total) by mouth daily. 02/19/23  Yes Gudena, Vinay, MD  metoprolol succinate (TOPROL-XL) 50 MG 24 hr tablet Take 50 mg by mouth daily. Take with or immediately following a meal.   Yes [provider]  nebivolol (BYSTOLIC) 10 MG tablet Take 10 mg by mouth daily. 08/20/23  Yes [provider]  omeprazole (PRILOSEC) 40 MG capsule Take 40 mg by mouth 2 (two) times daily as needed. 05/14/23  Yes [provider]  pregabalin (LYRICA) 150 MG capsule Take 1 capsule (150 mg total) by mouth 2 (two) times daily. 11/03/23  Yes Kammerer, Megan L, DO  rosuvastatin (CRESTOR) 5 MG tablet Take 5 mg by mouth daily.   Yes [provider]  valsartan-hydrochlorothiazide (DIOVAN-HCT) 80-12.5 MG tablet Take 1 tablet by mouth daily.   Yes  [provider]  cholecalciferol (VITAMIN D3) 25 MCG (1000 UNIT) tablet Take 1,000 Units by mouth daily.    [provider]  lidocaine  4 % Place 1 patch onto the skin daily. Patient not taking: Reported on 11/06/2023 12/21/22   Neysa Caron PARAS, DO     Critical care time: 40 minutes    CRITICAL CARE Performed by: Leita SAUNDERS Ersel Wadleigh   Total critical care time: 40 minutes  Critical care time was exclusive of separately billable procedures and treating other patients.  Critical care was necessary to treat or prevent imminent or life-threatening deterioration.  Critical care was time spent personally by me on the following activities: development of treatment plan with patient and/or surrogate as well as nursing, discussions with consultants, evaluation of patient's response to treatment, examination of patient, obtaining history from patient or surrogate, ordering and performing treatments and interventions, ordering and review of laboratory studies, ordering and review of radiographic studies, pulse oximetry and re-evaluation of patient's condition.   Leita SAUNDERS Sybel Standish, PA-C [facility] See Amion for pager If no response to pager , please call [phone] until 7pm After 7:00 pm call Elink  [phone]

## 2023-11-07 NOTE — Progress Notes (Addendum)
 PROGRESS NOTE    Vickie Collier  FMW:[id number] DOB: Feb 08, 1950 DOA: 11/06/2023 PCP: Benjamine Aland, MD    Brief Narrative:   Vickie Collier is a 73 y.o. female with past medical history significant for HTN, HLD, breast cancer who presented to [facility] ED on 11/06/2023 with progressive weakness over the last week and chest pain.  Patient's weakness progressed to the point unable to get out of a chair or ambulate which is far from her baseline.  Also endorses shortness of breath, dizziness.  Recently seen in the ED on 11/02/2023 with complaint of dizziness and knee pain.  During that evaluation EKG notable for new T wave inversions compared to previous EKG in 2023, troponins were monitored and were above normal limits but remained flat.  Recommended for admission for observation but patient and family insisted on going home per ED report.  In the ED, temperature 97.9 F, HR 78, RR 70, BP 111/100, SpO2 95% on 2 L nasal cannula.  WBC 7.9, hemoglobin 10.8, platelet count 222.  Sodium 144, potassium 4.3, chloride 107, CO2 24, glucose 122, BUN 26, creatinine 2.06.  AST 47, ALT 6, total bilirubin 0.5.  BNP greater than 35,000.  High sensitive troponin [phone].  Chest x-ray with no active cardiopulmonary disease process, noted cardiomegaly.  EKG with NSR, T wave inversions lead I, II, III, aVF, V1 through V6.  Cardiology was consulted.  Patient was placed on a heparin drip.  [facility] consulted for admission.  Significant hospital events: 10/22: admit NSTEMI to [facility], started on heparin drip, cardiology consulted 10/23: Concern for cardiogenic shock, peripheral Levophed ordered, PCCM consulted, transferring to [facility] 2H  Assessment & Plan:   NSTEMI Cardiogenic shock Patient presenting with progressive weakness, dizziness, chest pain.  Patient was afebrile without leukocytosis.  Chest x-ray with no concerning cardiopulmonary disease process.  BNP on admission greater than 35,000 with high sensitive troponin  initially elevated 1349 and uptrending to 2579.  EKG with normal sinus rhythm with T wave inversions in leads I, II, III, aVF, V1 through V6.  Lactic acid elevated at 3.9.  TTE with LVEF 35-40%, LV with moderately reduced function, LV demonstrates global hypokinesis, mild concentric LVH, grade 1 diastolic dysfunction, interventricular septum flattened in systole and diastole consistent with right ventricular pressure and volume overload, RV systolic function severely reduced, RV mildly enlarged, elevated PASP 57.5 mmHg, right atrial severely enlarged, trivial MR, severe TR, mild aortic valve stenosis, IVC dilated. 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  Creatinine elevated at 2.06 on admission followed by 2.36.  Initially received IV fluid bolus followed by Lasix 40 mg IV x 1.  Concern for ATN due to poor perfusion from cardiogenic shock as above. -- Cr 2.06>2.36 -- Norepinephrine drip ordered for vasopressor support -- Transferring to [facility], 2H for definitive care -- CMP in am  Urinary tract infection Urinalysis with large leukocytes, negative nitrite, many bacteria, greater than 50 WBCs.  Patient is afebrile  without leukocytosis.  Procalcitonin 0.43. -- Urine culture: Ordered -- Blood cultures x 2: Ordered -- Ceftriaxone 1 g IV every 24 hours  HTN --Discontinued home amlodipine,  Nebivolol, valsartan-HCTZ  HLD -- Crestor 10 mg p.o. daily  GERD -- Protonix 40 mg p.o. daily (substituted for home omeprazole)  History of breast cancer Follows with medical oncology, Dr. Gudena.  Currently on letrozole .  S/p radiation and right lumpectomy.  Obesity, class III BMI 44.1 kg/m   DVT prophylaxis: Heparin drip    Code Status: Full Code Family Communication: Update to family present at bedside  Disposition Plan:  Level of care: ICU Status is: Inpatient Remains inpatient appropriate because: Heparin drip, vasopressors, transferring to [facility], [place]    Consultants:  Cardiology PCCM  Procedures:  TTE  Antimicrobials:  Ceftriaxone   Subjective: Patient seen examined bedside, lying in bed.  Remains in ED holding area.  Blood pressures continue to deteriorate/drop.  Troponins continue to increase.  Remains on heparin drip.  Continues to have intermittent substernal chest pain.  Discussed with cardiology.  Starting norepinephrine drip.  Transferring to [facility], 2H for further management of NSTEMI and likely cardiogenic shock.  Patient is alert and oriented, complains of intermittent dizziness and generalized weakness.  Patient denies headache, no palpitations, no shortness of breath, no abdominal pain, no fever/chills/night sweats, no nausea/vomiting/diarrhea, no focal weakness, no paresthesias.  Discussed with RN.  Contacted [facility] for expedited transfer.  Objective: Vitals:   11/07/23 0723 11/07/23 0926 11/07/23 1117 11/07/23 1235  BP: 94/70 (!) 110/43  100/69  Pulse: (!) 55 (!) 54  (!) 54  Resp: 18 (!) 24  (!) 21  Temp: 98 F (36.7 C)  98.2 F (36.8 C) 97.9 F (36.6 C)  TempSrc: Oral  Oral Oral  SpO2: 100% 93%  97%    Intake/Output Summary (Last 24 hours) at 11/07/2023 1249 Last data filed at 11/06/2023 1856 Gross per 24 hour  Intake 100 ml  Output --  Net 100 ml   There were no vitals filed for this visit.  Examination:  Physical  Exam: GEN: NAD, alert and oriented x 3, obese HEENT: NCAT, PERRL, EOMI, sclera clear, MMM PULM: CTAB w/o wheezes/crackles, normal respiratory effort, on 2 L nasal cannula with SpO2 97% at rest CV: RRR GI: abd soft, NTND, + BS MSK: Trace lower extremity peripheral edema, bilateral upper/lower extremities distally cool to touch NEURO: No focal neurological deficit PSYCH: normal mood/affect Integumentary: No concerning rashes/lesions/wounds noted on exposed skin surfaces.    Data Reviewed: I have personally reviewed following labs and imaging studies  CBC: Recent Labs  Lab 11/02/23 1843 11/06/23 1733 11/07/23 0546  WBC 6.1 7.9 7.4  NEUTROABS 5.0 6.2  --   HGB 10.8* 10.8* 9.1*  HCT 34.3* 36.1 30.9*  MCV 81.9 86.8 88.3  PLT 254 222 179   Basic Metabolic Panel: Recent Labs  Lab 11/02/23 1843 11/06/23 1733 11/07/23 0546  NA 145 144 146*  K 3.5 4.3 4.1  CL 102 107 110  CO2 21* 24 22  GLUCOSE 93 122* 96  BUN 14 26* 29*  CREATININE 1.42* 2.06* 2.36*  CALCIUM 10.3 9.4 8.6*   GFR: Estimated Creatinine Clearance: 27.6 mL/min (A) (by C-G formula based on SCr of 2.36 mg/dL (H)). Liver Function Tests: Recent Labs  Lab 11/02/23 1843 11/06/23 1733  AST 21 47*  ALT 6 6  ALKPHOS 71 65  BILITOT 1.1 0.5  PROT 8.9* 7.8  ALBUMIN 4.2 3.7   No results  for input(s): LIPASE, AMYLASE in the last 168 hours. No results for input(s): AMMONIA in the last 168 hours. Coagulation Profile: No results for input(s): INR, PROTIME in the last 168 hours. Cardiac Enzymes: Recent Labs  Lab 11/02/23 1843  CKTOTAL 84   BNP (last 3 results) Recent Labs    11/06/23 1733  PROBNP >35,000.0*   HbA1C: No results for input(s): HGBA1C in the last 72 hours. CBG: Recent Labs  Lab 11/02/23 1834  GLUCAP 99   Lipid Profile: No results for input(s): CHOL, HDL, LDLCALC, TRIG, CHOLHDL, LDLDIRECT in the last 72 hours. Thyroid Function Tests: Recent Labs    11/06/23 1733   TSH 2.230   Anemia Panel: No results for input(s): VITAMINB12, FOLATE, FERRITIN, TIBC, IRON, RETICCTPCT in the last 72 hours. Sepsis Labs: Recent Labs  Lab 11/06/23 1741 11/06/23 1954 11/07/23 0800  PROCALCITON  --   --  0.43  LATICACIDVEN 3.9* 2.9*  --     Recent Results (from the past 240 hours)  Resp panel by RT-PCR (RSV, Flu A&B, Covid) Urine, Clean Catch     Status: None   Collection Time: 11/02/23 11:56 PM   Specimen: Urine, Clean Catch; Nasal Swab  Result Value Ref Range Status   SARS Coronavirus 2 by RT PCR NEGATIVE NEGATIVE Final    Comment: (NOTE) SARS-CoV-2 target nucleic acids are NOT DETECTED.  The SARS-CoV-2 RNA is generally detectable in upper respiratory specimens during the acute phase of infection. The lowest concentration of SARS-CoV-2 viral copies this assay can detect is 138 copies/mL. A negative result does not preclude SARS-Cov-2 infection and should not be used as the sole basis for treatment or other patient management decisions. A negative result may occur with  improper specimen collection/handling, submission of specimen other than nasopharyngeal swab, presence of viral mutation(s) within the areas targeted by this assay, and inadequate number of viral copies(<138 copies/mL). A negative result must be combined with clinical observations, patient history, and epidemiological information. The expected result is Negative.  Fact Sheet for Patients:  BloggerCourse.com  Fact Sheet for Healthcare Providers:  SeriousBroker.it  This test is no t yet approved or cleared by the [place]  FDA and  has been authorized for detection and/or diagnosis of SARS-CoV-2 by FDA under an Emergency Use Authorization (EUA). This EUA will remain  in effect (meaning this test can be used) for the duration of the COVID-19 declaration under Section 564(b)(1) of the Act, 21 U.S.C.section 360bbb-3(b)(1),  unless the authorization is terminated  or revoked sooner.       Influenza A by PCR NEGATIVE NEGATIVE Final   Influenza B by PCR NEGATIVE NEGATIVE Final    Comment: (NOTE) The Xpert Xpress SARS-CoV-2/FLU/RSV plus assay is intended as an aid in the diagnosis of influenza from Nasopharyngeal swab specimens and should not be used as a sole basis for treatment. Nasal washings and aspirates are unacceptable for Xpert Xpress SARS-CoV-2/FLU/RSV testing.  Fact Sheet for Patients: BloggerCourse.com  Fact Sheet for Healthcare Providers: SeriousBroker.it  This test is not yet approved or cleared by the [place]  FDA and has been authorized for detection and/or diagnosis of SARS-CoV-2 by FDA under an Emergency Use Authorization (EUA). This EUA will remain in effect (meaning this test can be used) for the duration of the COVID-19 declaration under Section 564(b)(1) of the Act, 21 U.S.C. section 360bbb-3(b)(1), unless the authorization is terminated or revoked.     Resp Syncytial Virus by PCR NEGATIVE NEGATIVE Final    Comment: (NOTE) Fact Sheet  for Patients: BloggerCourse.com  Fact Sheet for Healthcare Providers: SeriousBroker.it  This test is not yet approved or cleared by the [place]  FDA and has been authorized for detection and/or diagnosis of SARS-CoV-2 by FDA under an Emergency Use Authorization (EUA). This EUA will remain in effect (meaning this test can be used) for the duration of the COVID-19 declaration under Section 564(b)(1) of the Act, 21 U.S.C. section 360bbb-3(b)(1), unless the authorization is terminated or revoked.  Performed at [facility], [address] [place], [place] [address]   Resp panel by RT-PCR (RSV, Flu A&B, Covid) Anterior Nasal Swab     Status: None   Collection Time: 11/06/23  5:33 PM   Specimen: Anterior Nasal Swab   Result Value Ref Range Status   SARS Coronavirus 2 by RT PCR NEGATIVE NEGATIVE Final    Comment: (NOTE) SARS-CoV-2 target nucleic acids are NOT DETECTED.  The SARS-CoV-2 RNA is generally detectable in upper respiratory specimens during the acute phase of infection. The lowest concentration of SARS-CoV-2 viral copies this assay can detect is 138 copies/mL. A negative result does not preclude SARS-Cov-2 infection and should not be used as the sole basis for treatment or other patient management decisions. A negative result may occur with  improper specimen collection/handling, submission of specimen other than nasopharyngeal swab, presence of viral mutation(s) within the areas targeted by this assay, and inadequate number of viral copies(<138 copies/mL). A negative result must be combined with clinical observations, patient history, and epidemiological information. The expected result is Negative.  Fact Sheet for Patients:  BloggerCourse.com  Fact Sheet for Healthcare Providers:  SeriousBroker.it  This test is no t yet approved or cleared by the [place]  FDA and  has been authorized for detection and/or diagnosis of SARS-CoV-2 by FDA under an Emergency Use Authorization (EUA). This EUA will remain  in effect (meaning this test can be used) for the duration of the COVID-19 declaration under Section 564(b)(1) of the Act, 21 U.S.C.section 360bbb-3(b)(1), unless the authorization is terminated  or revoked sooner.       Influenza A by PCR NEGATIVE NEGATIVE Final   Influenza B by PCR NEGATIVE NEGATIVE Final    Comment: (NOTE) The Xpert Xpress SARS-CoV-2/FLU/RSV plus assay is intended as an aid in the diagnosis of influenza from Nasopharyngeal swab specimens and should not be used as a sole basis for treatment. Nasal washings and aspirates are unacceptable for Xpert Xpress SARS-CoV-2/FLU/RSV testing.  Fact Sheet for  Patients: BloggerCourse.com  Fact Sheet for Healthcare Providers: SeriousBroker.it  This test is not yet approved or cleared by the [place]  FDA and has been authorized for detection and/or diagnosis of SARS-CoV-2 by FDA under an Emergency Use Authorization (EUA). This EUA will remain in effect (meaning this test can be used) for the duration of the COVID-19 declaration under Section 564(b)(1) of the Act, 21 U.S.C. section 360bbb-3(b)(1), unless the authorization is terminated or revoked.     Resp Syncytial Virus by PCR NEGATIVE NEGATIVE Final    Comment: (NOTE) Fact Sheet for Patients: BloggerCourse.com  Fact Sheet for Healthcare Providers: SeriousBroker.it  This test is not yet approved or cleared by the [place]  FDA and has been authorized for detection and/or diagnosis of SARS-CoV-2 by FDA under an Emergency Use Authorization (EUA). This EUA will remain in effect (meaning this test can be used) for the duration of the COVID-19 declaration under Section 564(b)(1) of the Act, 21 U.S.C. section 360bbb-3(b)(1), unless the authorization is terminated or revoked.  Performed  at [facility], [address] [place], [place] [address]          Radiology Studies: ECHOCARDIOGRAM COMPLETE Result Date: 11/07/2023    ECHOCARDIOGRAM REPORT   Patient Name:   SHERLYNE CROWNOVER Date of Exam: 11/07/2023 Medical Rec #:  [id number]       Height:       65.0 in Accession #:    [id number]      Weight:       265.0 lb Date of Birth:  [birth year]      BSA:          2.229 m Patient Age:    72 years        BP:           94/70 mmHg Patient Gender: F               HR:           60 bpm. Exam Location:  Inpatient Procedure: 2D Echo, Cardiac Doppler and Color Doppler (Both Spectral and Color            Flow Doppler were utilized during procedure). Indications:    Chest Pain R07.9  , NSTEMI I21.4  History:        Patient has no prior history of Echocardiogram examinations.  Sonographer:    Tinnie Gosling RDCS Referring Phys: [id number] Bret Stamour J [place] IMPRESSIONS  1. Left ventricular ejection fraction, by estimation, is 35 to 40%. The left ventricle has moderately decreased function. The left ventricle demonstrates global hypokinesis. There is mild concentric left ventricular hypertrophy. Left ventricular diastolic parameters are consistent with Grade I diastolic dysfunction (impaired relaxation). There is the interventricular septum is flattened in systole and diastole, consistent with right ventricular pressure and volume overload.  2. Right ventricular systolic function is severely reduced. The right ventricular size is moderately enlarged. There is moderately elevated pulmonary artery systolic pressure. The estimated right ventricular systolic pressure is 57.5 mmHg.  3. Right atrial size was severely dilated.  4. The mitral valve is normal in structure. Trivial mitral valve regurgitation. No evidence of mitral stenosis.  5. Tricuspid valve regurgitation is severe.  6. The aortic valve is tricuspid. There is mild calcification of the aortic valve. Aortic valve regurgitation is not visualized. Mild aortic valve stenosis. Aortic valve mean gradient measures 4.0 mmHg. Aortic valve Vmax measures 1.29 m/s.  7. The inferior vena cava is dilated in size with <50% respiratory variability, suggesting right atrial pressure of 15 mmHg. FINDINGS  Left Ventricle: Left ventricular ejection fraction, by estimation, is 35 to 40%. The left ventricle has moderately decreased function. The left ventricle demonstrates global hypokinesis. The left ventricular internal cavity size was normal in size. There is mild concentric left ventricular hypertrophy. The interventricular septum is flattened in systole and diastole, consistent with right ventricular pressure and volume overload. Left ventricular diastolic  parameters are consistent with Grade I diastolic dysfunction (impaired relaxation). Right Ventricle: The right ventricular size is moderately enlarged. No increase in right ventricular wall thickness. Right ventricular systolic function is severely reduced. There is moderately elevated pulmonary artery systolic pressure. The tricuspid regurgitant velocity is 3.26 m/s, and with an assumed right atrial pressure of 15 mmHg, the estimated right ventricular systolic pressure is 57.5 mmHg. Left Atrium: Left atrial size was normal in size. Right Atrium: Right atrial size was severely dilated. Pericardium: There is no evidence of pericardial effusion. Mitral Valve: The mitral valve is normal in structure. Mild mitral annular calcification.  Trivial mitral valve regurgitation. No evidence of mitral valve stenosis. Tricuspid Valve: The tricuspid valve is normal in structure. Tricuspid valve regurgitation is severe. No evidence of tricuspid stenosis. Aortic Valve: The aortic valve is tricuspid. There is mild calcification of the aortic valve. Aortic valve regurgitation is not visualized. Mild aortic stenosis is present. Aortic valve mean gradient measures 4.0 mmHg. Aortic valve peak gradient measures  6.7 mmHg. Aortic valve area, by VTI measures 1.41 cm. Pulmonic Valve: The pulmonic valve was normal in structure. Pulmonic valve regurgitation is trivial. No evidence of pulmonic stenosis. Aorta: The aortic root is normal in size and structure. Venous: The inferior vena cava is dilated in size with less than 50% respiratory variability, suggesting right atrial pressure of 15 mmHg. IAS/Shunts: There is left bowing of the interatrial septum, suggestive of elevated right atrial pressure. No atrial level shunt detected by color flow Doppler.  LEFT VENTRICLE PLAX 2D LVIDd:         3.90 cm   Diastology LVIDs:         3.20 cm   LV e' medial:    4.13 cm/s LV PW:         1.10 cm   LV E/e' medial:  9.4 LV IVS:        1.00 cm   LV e'  lateral:   3.70 cm/s LVOT diam:     2.00 cm   LV E/e' lateral: 10.5 LV SV:         31 LV SV Index:   14 LVOT Area:     3.14 cm  RIGHT VENTRICLE            IVC RV S prime:     9.25 cm/s  IVC diam: 2.30 cm TAPSE (M-mode): 0.8 cm LEFT ATRIUM           Index        RIGHT ATRIUM           Index LA diam:      3.00 cm 1.35 cm/m   RA Area:     32.10 cm LA Vol (A4C): 32.0 ml 14.35 ml/m  RA Volume:   144.00 ml 64.59 ml/m  AORTIC VALVE AV Area (Vmax):    1.67 cm AV Area (Vmean):   1.62 cm AV Area (VTI):     1.41 cm AV Vmax:           129.00 cm/s AV Vmean:          88.500 cm/s AV VTI:            0.223 m AV Peak Grad:      6.7 mmHg AV Mean Grad:      4.0 mmHg LVOT Vmax:         68.50 cm/s LVOT Vmean:        45.500 cm/s LVOT VTI:          0.100 m LVOT/AV VTI ratio: 0.45  AORTA Ao Root diam: 2.70 cm Ao Asc diam:  3.50 cm MITRAL VALVE               TRICUSPID VALVE MV Area (PHT): 2.11 cm    TR Peak grad:   42.5 mmHg MV Decel Time: 359 msec    TR Mean grad:   29.0 mmHg MV E velocity: 39.00 cm/s  TR Vmax:        326.00 cm/s MV A velocity: 48.00 cm/s  TR Vmean:       254.0 cm/s MV E/A  ratio:  0.81                            SHUNTS                            Systemic VTI:  0.10 m                            Systemic Diam: 2.00 cm Toribio Fuel MD Electronically signed by Toribio Fuel MD Signature Date/Time: 11/07/2023/12:17:10 PM    Final    DG Chest Port 1 View Result Date: 11/06/2023 CLINICAL DATA:  Shortness of breath. EXAM: PORTABLE CHEST 1 VIEW COMPARISON:  Chest radiograph dated 11/02/2023 FINDINGS: No focal consolidation, pleural effusion or pneumothorax. Stable cardiomegaly. No acute osseous pathology. Degenerative changes of the spine and shoulders. IMPRESSION: 1. No active disease. 2. Cardiomegaly. Electronically Signed   By: Vanetta Chou M.D.   On: 11/06/2023 17:53        Scheduled Meds:  aspirin EC  81 mg Oral Daily   pantoprazole  40 mg Oral Daily   pregabalin  150 mg Oral BID    rosuvastatin  10 mg Oral Daily   Continuous Infusions:  sodium chloride      heparin 1,000 Units/hr (11/07/23 1141)   norepinephrine (LEVOPHED) Adult infusion       LOS: 1 day    Critical Care Time Upon my evaluation, this patient had a high probability of imminent or life-threatening deterioration due to cardiogenic shock/NSTEMI, which required my direct attention, intervention, and personal management.  I have personally provided 52 minutes of critical care time exclusive of my time spent on separately billable procedures.  Time includes review of laboratory data, radiology results, discussion with consultants, and monitoring for potential decompensation.       Camellia PARAS [place], DO [facility] Available via Epic secure chat 7am-7pm After these hours, please refer to coverage provider listed on [email or web address] 11/07/2023, 12:49 PM

## 2023-11-07 NOTE — Progress Notes (Signed)
  Echocardiogram 2D Echocardiogram has been performed.  Tinnie FORBES Gosling RDCS 11/07/2023, 9:21 AM

## 2023-11-07 NOTE — Consult Note (Cosign Needed Addendum)
 Advanced Heart Failure Team Consult Note   Primary Physician: Benjamine Aland, MD Cardiologist:  None  Reason for Consultation: Dr. Loni  HPI:    Vickie Collier is seen today for evaluation of cardiogenic shock at the request of Dr. Loni with [facility]. 73 y.o. female with history of HTN, HLD, breast cancer s/p radiation on letrozole , GERD, arthritis, CKD IIIb.   Seen in ED 10/18 with generalized weakness, lightheadedness and shortness of breath with exertion. She was not having chest pain but does report palpitations HS troponin 46>32. T wave inversions noted on ECG. Admission for workup recommended but patient declined.   Returned to the ED 10/22 with worsening weakness and dizziness with presyncope. Unable to stand without feeling as if she may blackout. Labs: Scr > 2 (1.4 several days prior), lactic acid 3.9>2.9, HS troponin 1349>1364>2175, ProBNP > 35000. ECG with deep TWI in inferior and anterolateral leads. Started on IV heparin and given aspirin. Cardiology consulted d/t concern for ACS. Echo w/ EF 35-40%, septal flattening, severely reduced RV, RA severely dilated, severe TR, dilated IVC. She was normotensive on presentation but subsequently became hypotensive with cool extremities. Overall picture concerning for RV failure leading to cardiogenic shock. She was transferred to [facility] for evaluation by Advanced Heart Failure.  Previously worked as a Psychologist, sport and exercise. Ambulates with a walker. Not very mobile d/t chronic joint pain. Has had both knees replaced.  Denies ETOH and tobacco use.   Home Medications Prior to Admission medications   Medication Sig Start Date End Date Taking? Authorizing Provider  amLODipine (NORVASC) 10 MG tablet Take 10 mg by mouth daily.   Yes [provider]  baclofen (LIORESAL) 10 MG tablet Take 10 mg by mouth 2 (two) times daily.   Yes [provider]  cyanocobalamin 100 MCG tablet Take 100 mcg by mouth daily.   Yes [provider]  felodipine (PLENDIL) 5 MG 24 hr tablet Take 5 mg by mouth daily.   Yes [provider]  letrozole  (FEMARA ) 2.5 MG tablet Take 1 tablet (2.5 mg total) by mouth daily. 02/19/23  Yes Gudena, Vinay, MD  metoprolol succinate (TOPROL-XL) 50 MG 24 hr tablet Take 50 mg by mouth daily. Take with or immediately following a meal.   Yes [provider]  nebivolol (BYSTOLIC) 10 MG tablet Take 10 mg by mouth daily. 08/20/23  Yes [provider]  omeprazole (PRILOSEC) 40 MG capsule Take 40 mg by mouth 2 (two) times daily as needed. 05/14/23  Yes [provider]  pregabalin (LYRICA) 150 MG capsule Take 1 capsule (150 mg total) by mouth 2 (two) times daily. 11/03/23  Yes Kammerer, Megan L, DO  rosuvastatin (CRESTOR) 5 MG tablet Take 5 mg by mouth daily.   Yes [provider]  valsartan-hydrochlorothiazide (DIOVAN-HCT) 80-12.5 MG tablet Take 1 tablet by mouth daily.   Yes [provider]  cholecalciferol (VITAMIN D3) 25 MCG (1000 UNIT) tablet Take 1,000 Units by mouth daily.    [provider]  lidocaine  4 % Place 1 patch onto the skin daily. Patient not taking: Reported on 11/06/2023 12/21/22   Neysa Caron PARAS, DO    Past Medical History: Past Medical History:  Diagnosis Date   Arthritis    Atypical ductal hyperplasia of right breast    Breast cancer (HCC) 01/25/2021   Family history of ovarian cancer    Family history of pancreatic cancer    Family history of prostate cancer    Family  history of stomach cancer    GERD (gastroesophageal reflux disease)    Hyperlipidemia    Hypertension    Personal history of radiation therapy     Past Surgical History: Past Surgical History:  Procedure Laterality Date   ABDOMINAL HYSTERECTOMY     BREAST LUMPECTOMY     JOINT REPLACEMENT Bilateral    TKR   RADIOACTIVE SEED GUIDED EXCISIONAL BREAST BIOPSY Right 01/25/2021   Procedure: RADIOACTIVE SEED GUIDED EXCISIONAL RIGHT BREAST BIOPSY;   Surgeon: Aron Shoulders, MD;  Location: [facility];  Service: General;  Laterality: Right;   RE-EXCISION OF BREAST LUMPECTOMY Right 02/14/2021   Procedure: RE-EXCISION OF RIGHT BREAST LUMPECTOMY;  Surgeon: Aron Shoulders, MD;  Location: [facility];  Service: General;  Laterality: Right;    Family History: Family History  Problem Relation Age of Onset   Lupus Father    Ovarian cancer Maternal Grandmother 21   Prostate cancer Half-Brother 13   Pancreatic cancer Cousin        paternal first cousin   Stomach cancer Cousin        paternal first cousin   Lung cancer Cousin    Cancer Cousin        NOS    Social History: Social History   Socioeconomic History   Marital status: Single    Spouse name: Not on file   Number of children: Not on file   Years of education: Not on file   Highest education level: Not on file  Occupational History   Not on file  Tobacco Use   Smoking status: Never   Smokeless tobacco: Never  Substance and Sexual Activity   Alcohol use: Not Currently   Drug use: Never   Sexual activity: Not Currently    Birth control/protection: Surgical  Other Topics Concern   Not on file  Social History Narrative   Not on file   Social Drivers of Health   Financial Resource Strain: Low Risk  (02/10/2019)   Received from [facility]   Overall Financial Resource Strain (CARDIA)    Difficulty of Paying Living Expenses: Not hard at all  Food Insecurity: No Food Insecurity (02/10/2019)   Received from [facility]   Hunger Vital Sign    Within the past 12 months, you worried that your food would run out before you got the money to buy more.: Never true    Within the past 12 months, the food you bought just didn't last and you didn't have money to get more.: Never true  Transportation Needs: No Transportation Needs (02/10/2019)   Received from [facility]   PRAPARE -  Administrator, Civil Service (Medical): No    Lack of Transportation (Non-Medical): No  Physical Activity: Insufficiently Active (08/28/2017)   Received from [facility]   Exercise Vital Sign    Days of Exercise per Week: 7 days    Minutes of Exercise per Session: 20 min  Stress: No Stress Concern Present (08/28/2017)   Received from [facility]   [employer] of Occupational Health - Occupational Stress Questionnaire    Feeling of Stress : Not at all  Social Connections: Unknown (04/02/2018)   Received from [facility]   Social Connection and Isolation Panel    Frequency of Communication with Friends and Family: Not on file    Frequency of Social Gatherings with Friends  and Family: Not on file    Attends Religious Services: Not on file    Active Member of Clubs or Organizations: Not on file    Attends Club or Organization Meetings: Not on file    Are you married, widowed, divorced, separated, never married, or living with a partner?: Divorced    Allergies:  Allergies  Allergen Reactions   Lisinopril Cough    Objective:    Vital Signs:   Temp:  [97.6 F (36.4 C)-98.2 F (36.8 C)] 97.9 F (36.6 C) (10/23 1235) Pulse Rate:  [49-78] 53 (10/23 1315) Resp:  [16-26] 18 (10/23 1330) BP: (91-152)/(43-100) 103/65 (10/23 1330) SpO2:  [93 %-100 %] 97 % (10/23 1315)    Weight change: There were no vitals filed for this visit.  Intake/Output:   Intake/Output Summary (Last 24 hours) at 11/07/2023 1403 Last data filed at 11/06/2023 1856 Gross per 24 hour  Intake 100 ml  Output --  Net 100 ml      Physical Exam    General:  Fatigued appearing elderly female Cor: JVP midneck. Regular rate & rhythm. 2/6 systolic murmur. Lungs: clear Abdomen: obese, nondistended soft, nontender, nondistended.  Extremities: trace edema, legs are cool Neuro: alert & orientedx3. Affect pleasant   Telemetry    Sinus brady 50s  EKG    Today 7:27 AM: Sinus brady 55 bpm, PVC, T wave inversions inferior and anterolateral leads  Labs   Basic Metabolic Panel: Recent Labs  Lab 11/02/23 1843 11/06/23 1733 11/07/23 0546  NA 145 144 146*  K 3.5 4.3 4.1  CL 102 107 110  CO2 21* 24 22  GLUCOSE 93 122* 96  BUN 14 26* 29*  CREATININE 1.42* 2.06* 2.36*  CALCIUM 10.3 9.4 8.6*    Liver Function Tests: Recent Labs  Lab 11/02/23 1843 11/06/23 1733  AST 21 47*  ALT 6 6  ALKPHOS 71 65  BILITOT 1.1 0.5  PROT 8.9* 7.8  ALBUMIN 4.2 3.7   No results for input(s): LIPASE, AMYLASE in the last 168 hours. No results for input(s): AMMONIA in the last 168 hours.  CBC: Recent Labs  Lab 11/02/23 1843 11/06/23 1733 11/07/23 0546  WBC 6.1 7.9 7.4  NEUTROABS 5.0 6.2  --   HGB 10.8* 10.8* 9.1*  HCT 34.3* 36.1 30.9*  MCV 81.9 86.8 88.3  PLT 254 222 179    Cardiac Enzymes: Recent Labs  Lab 11/02/23 1843  CKTOTAL 84    BNP: BNP (last 3 results) No results for input(s): BNP in the last 8760 hours.  ProBNP (last 3 results) Recent Labs    11/06/23 1733  PROBNP >35,000.0*     CBG: Recent Labs  Lab 11/02/23 1834  GLUCAP 99    Coagulation Studies: No results for input(s): LABPROT, INR in the last 72 hours.   Imaging   ECHOCARDIOGRAM COMPLETE Result Date: 11/07/2023    ECHOCARDIOGRAM REPORT   Patient Name:   WILLIAM LASKE Date of Exam: 11/07/2023 Medical Rec #:  [id number]       Height:       65.0 in Accession #:    [id number]      Weight:       265.0 lb Date of Birth:  03-13-1950      BSA:          2.229 m Patient Age:    72 years        BP:           94/70 mmHg Patient Gender:  F               HR:           60 bpm. Exam Location:  Inpatient Procedure: 2D Echo, Cardiac Doppler and Color Doppler (Both Spectral and Color            Flow Doppler were utilized during procedure). Indications:    Chest Pain R07.9 , NSTEMI I21.4  History:        Patient has no prior  history of Echocardiogram examinations.  Sonographer:    Tinnie Gosling RDCS Referring Phys: [id number] ERIC J [place] IMPRESSIONS  1. Left ventricular ejection fraction, by estimation, is 35 to 40%. The left ventricle has moderately decreased function. The left ventricle demonstrates global hypokinesis. There is mild concentric left ventricular hypertrophy. Left ventricular diastolic parameters are consistent with Grade I diastolic dysfunction (impaired relaxation). There is the interventricular septum is flattened in systole and diastole, consistent with right ventricular pressure and volume overload.  2. Right ventricular systolic function is severely reduced. The right ventricular size is moderately enlarged. There is moderately elevated pulmonary artery systolic pressure. The estimated right ventricular systolic pressure is 57.5 mmHg.  3. Right atrial size was severely dilated.  4. The mitral valve is normal in structure. Trivial mitral valve regurgitation. No evidence of mitral stenosis.  5. Tricuspid valve regurgitation is severe.  6. The aortic valve is tricuspid. There is mild calcification of the aortic valve. Aortic valve regurgitation is not visualized. Mild aortic valve stenosis. Aortic valve mean gradient measures 4.0 mmHg. Aortic valve Vmax measures 1.29 m/s.  7. The inferior vena cava is dilated in size with <50% respiratory variability, suggesting right atrial pressure of 15 mmHg. FINDINGS  Left Ventricle: Left ventricular ejection fraction, by estimation, is 35 to 40%. The left ventricle has moderately decreased function. The left ventricle demonstrates global hypokinesis. The left ventricular internal cavity size was normal in size. There is mild concentric left ventricular hypertrophy. The interventricular septum is flattened in systole and diastole, consistent with right ventricular pressure and volume overload. Left ventricular diastolic parameters are consistent with Grade I diastolic dysfunction  (impaired relaxation). Right Ventricle: The right ventricular size is moderately enlarged. No increase in right ventricular wall thickness. Right ventricular systolic function is severely reduced. There is moderately elevated pulmonary artery systolic pressure. The tricuspid regurgitant velocity is 3.26 m/s, and with an assumed right atrial pressure of 15 mmHg, the estimated right ventricular systolic pressure is 57.5 mmHg. Left Atrium: Left atrial size was normal in size. Right Atrium: Right atrial size was severely dilated. Pericardium: There is no evidence of pericardial effusion. Mitral Valve: The mitral valve is normal in structure. Mild mitral annular calcification. Trivial mitral valve regurgitation. No evidence of mitral valve stenosis. Tricuspid Valve: The tricuspid valve is normal in structure. Tricuspid valve regurgitation is severe. No evidence of tricuspid stenosis. Aortic Valve: The aortic valve is tricuspid. There is mild calcification of the aortic valve. Aortic valve regurgitation is not visualized. Mild aortic stenosis is present. Aortic valve mean gradient measures 4.0 mmHg. Aortic valve peak gradient measures  6.7 mmHg. Aortic valve area, by VTI measures 1.41 cm. Pulmonic Valve: The pulmonic valve was normal in structure. Pulmonic valve regurgitation is trivial. No evidence of pulmonic stenosis. Aorta: The aortic root is normal in size and structure. Venous: The inferior vena cava is dilated in size with less than 50% respiratory variability, suggesting right atrial pressure of 15 mmHg. IAS/Shunts: There is left bowing of the interatrial  septum, suggestive of elevated right atrial pressure. No atrial level shunt detected by color flow Doppler.  LEFT VENTRICLE PLAX 2D LVIDd:         3.90 cm   Diastology LVIDs:         3.20 cm   LV e' medial:    4.13 cm/s LV PW:         1.10 cm   LV E/e' medial:  9.4 LV IVS:        1.00 cm   LV e' lateral:   3.70 cm/s LVOT diam:     2.00 cm   LV E/e' lateral: 10.5  LV SV:         31 LV SV Index:   14 LVOT Area:     3.14 cm  RIGHT VENTRICLE            IVC RV S prime:     9.25 cm/s  IVC diam: 2.30 cm TAPSE (M-mode): 0.8 cm LEFT ATRIUM           Index        RIGHT ATRIUM           Index LA diam:      3.00 cm 1.35 cm/m   RA Area:     32.10 cm LA Vol (A4C): 32.0 ml 14.35 ml/m  RA Volume:   144.00 ml 64.59 ml/m  AORTIC VALVE AV Area (Vmax):    1.67 cm AV Area (Vmean):   1.62 cm AV Area (VTI):     1.41 cm AV Vmax:           129.00 cm/s AV Vmean:          88.500 cm/s AV VTI:            0.223 m AV Peak Grad:      6.7 mmHg AV Mean Grad:      4.0 mmHg LVOT Vmax:         68.50 cm/s LVOT Vmean:        45.500 cm/s LVOT VTI:          0.100 m LVOT/AV VTI ratio: 0.45  AORTA Ao Root diam: 2.70 cm Ao Asc diam:  3.50 cm MITRAL VALVE               TRICUSPID VALVE MV Area (PHT): 2.11 cm    TR Peak grad:   42.5 mmHg MV Decel Time: 359 msec    TR Mean grad:   29.0 mmHg MV E velocity: 39.00 cm/s  TR Vmax:        326.00 cm/s MV A velocity: 48.00 cm/s  TR Vmean:       254.0 cm/s MV E/A ratio:  0.81                            SHUNTS                            Systemic VTI:  0.10 m                            Systemic Diam: 2.00 cm Toribio Fuel MD Electronically signed by Toribio Fuel MD Signature Date/Time: 11/07/2023/12:17:10 PM    Final    DG Chest Port 1 View Result Date: 11/06/2023 CLINICAL DATA:  Shortness of breath. EXAM: PORTABLE CHEST 1 VIEW COMPARISON:  Chest radiograph  dated 11/02/2023 FINDINGS: No focal consolidation, pleural effusion or pneumothorax. Stable cardiomegaly. No acute osseous pathology. Degenerative changes of the spine and shoulders. IMPRESSION: 1. No active disease. 2. Cardiomegaly. Electronically Signed   By: Vanetta Chou M.D.   On: 11/06/2023 17:53     Medications:     Current Medications:  aspirin EC  81 mg Oral Daily   pantoprazole  40 mg Oral Daily   pregabalin  150 mg Oral BID   rosuvastatin  10 mg Oral Daily    Infusions:  sodium  chloride     cefTRIAXone (ROCEPHIN)  IV     heparin 1,000 Units/hr (11/07/23 1141)   norepinephrine (LEVOPHED) Adult infusion        Patient Profile   73 y.o. female with history of HTN, HLD, breast cancer, GERD, arthritis, CKD IIIb. Admitted with biventricular heart failure (primarily RV)/cardiogenic shock.  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 ? RV infarct. HS troponin elevated to >2500. However, denies clear angina. PE also a consideration but lower on differential. Family notes hx snoring, strong suspicion for longstanding untreated OSA. Will eventually need sleep study. -Echo w/ EF 35-40%, septal flattening, severely reduced RV, RA severely dilated, severe TR, dilated IVC -Plan for RHC tomorrow. Ideally would arrange LHC as well but will need to hold off for now d/t AKI -Central line placement this afternoon by CCM -Follow CVP and CO-OX. Lactic acid 3.9>2.9, recheck pending -Anticipate will need inotropic support to facilitate diuresis  Informed Consent   Shared Decision Making/Informed Consent{  The risks, including but not limited to, [bleeding or vascular complications (1 in 500), pneumothorax (1 in 1600), arrhythmia (1 in 1000) and death (1 in 5000)], benefits (diagnostic support and/or management of heart failure, pulmonary hypertension) and alternatives of a right heart catheterization were discussed in detail with Ms. Capurro and she is willing to proceed.      NSTEMI -Unclear if type I or type II myocardial injury. HS troponin not quite as high as would expect with RV infarct -Continue heparin gtt for now -Aspirin ASA + statin -Holding off on LHC for now d/t AKI and not having active angina  AKI on CKD IIIb -Scr baseline around 1.3 -Cr 2.06 today in setting of shock, check afternoon labs -Follow UOP, dropped off significantly today  UTI -Primary team treating with ceftriaxone -Urine  culture and blood cultures pending  Anemia -Hgb 10.8>9 -Trend closely, watch for bleeding -check iron studies  Length of Stay: 1  Rosezella Kronick N, PA-C  11/07/2023, 2:03 PM    Advanced Heart Failure Team Pager [phone] (M-F; 7a - 5p)  Please contact [facility] for night-coverage after hours (4p -7a ) and weekends on [email or web address]   Agree with above with no previous cardiac history.  Admitted with several weeks of exertional presyncope.   Now developed cardiogenic shock. Echo with LVEF 3-40% RV severely reduced. Hstrop up but denies CP.  Course c/b hypotension and AKI  General:  Elderly. No resp difficulty HEENT: normal Neck: supple. JVP 10  Carotids 2+ bilat; no bruits. No lymphadenopathy or thryomegaly appreciated. Cor: PMI nondisplaced. Regular rate & rhythm. No rubs, gallops or murmurs. Lungs: clear Abdomen: soft, nontender, nondistended. No hepatosplenomegaly. No bruits or masses. Good bowel sounds. Extremities: no cyanosis, clubbing, rash, 1+ edema cool Neuro: alert & orientedx3, cranial nerves grossly intact. moves all 4 extremities w/o difficulty. Affect pleasant  She has cardiogenic shock due primarily to RV  failure. By history this seems to be semi-acute/progressive issue but elevated hstrop raises question of NSTEMI/RV infarct (vs PE)  Will place central access. Check CVP and co-ox. Will likely need inotropic support.   Plan RHC tomorrow. Will also need coroanry angio if Scr permits  CRITICAL CARE Performed by: Cherrie Sieving  Total critical care time: 45 minutes  Critical care time was exclusive of separately billable procedures and treating other patients.  Critical care was necessary to treat or prevent imminent or life-threatening deterioration.  Critical care was time spent personally by me (independent of midlevel providers or residents) on the following activities: development of treatment plan with patient and/or surrogate as well as nursing,  discussions with consultants, evaluation of patient's response to treatment, examination of patient, obtaining history from patient or surrogate, ordering and performing treatments and interventions, ordering and review of laboratory studies, ordering and review of radiographic studies, pulse oximetry and re-evaluation of patient's condition.  Sieving Cherrie, MD  8:18 AM

## 2023-11-07 NOTE — Procedures (Signed)
 Central Venous Catheter Insertion Procedure Note  Vickie Collier  [id number]  02-23-1950  Date:11/07/23  Time:4:07 PM   Provider Performing:Josedaniel Haye R Khristen Cheyney   Procedure: Insertion of Non-tunneled Central Venous Catheter(36556) with US  guidance (23062)   Indication(s) Medication administration  Consent Risks of the procedure as well as the alternatives and risks of each were explained to the patient and/or caregiver.  Consent for the procedure was obtained and is signed in the bedside chart  Anesthesia Topical only with 1% lidocaine    Timeout Verified patient identification, verified procedure, site/side was marked, verified correct patient position, special equipment/implants available, medications/allergies/relevant history reviewed, required imaging and test results available.  Sterile Technique Maximal sterile technique including full sterile barrier drape, hand hygiene, sterile gown, sterile gloves, mask, hair covering, sterile ultrasound probe cover (if used).  Procedure Description Area of catheter insertion was cleaned with chlorhexidine  and draped in sterile fashion.  With real-time ultrasound guidance a central venous catheter was placed into the left internal jugular vein. Nonpulsatile blood flow and easy flushing noted in all ports.  The catheter was sutured in place and sterile dressing applied.  Complications/Tolerance None; patient tolerated the procedure well. Chest X-ray is ordered to verify placement for internal jugular or subclavian cannulation.   Chest x-ray is not ordered for femoral cannulation.  EBL Minimal  Specimen(s) None   Vickie Collier [name], PA-C

## 2023-11-07 NOTE — Progress Notes (Signed)
 PHARMACY - ANTICOAGULATION CONSULT NOTE  Pharmacy Consult for heparin Indication: chest pain/ACS  Allergies  Allergen Reactions   Lisinopril Cough    Patient Measurements:   Heparin dosing weight: 86 kg  Vital Signs: Temp: 98 F (36.7 C) (10/23 0723) Temp Source: Oral (10/23 0723) BP: 110/43 (10/23 0926) Pulse Rate: 54 (10/23 0926)  Labs: Recent Labs    11/06/23 1733 11/07/23 0545 11/07/23 0546 11/07/23 0750  HGB 10.8*  --  9.1*  --   HCT 36.1  --  30.9*  --   PLT 222  --  179  --   HEPARINUNFRC  --  >1.10*  --  >1.10*  CREATININE 2.06*  --  2.36*  --     Estimated Creatinine Clearance: 27.6 mL/min (A) (by C-G formula based on SCr of 2.36 mg/dL (H)).   Medical History: Past Medical History:  Diagnosis Date   Arthritis    Atypical ductal hyperplasia of right breast    Breast cancer (HCC) 01/25/2021   Family history of ovarian cancer    Family history of pancreatic cancer    Family history of prostate cancer    Family history of stomach cancer    GERD (gastroesophageal reflux disease)    Hyperlipidemia    Hypertension    Personal history of radiation therapy     Medications: No anticoagulants PTA  Assessment: Pt is a 72 yoF on heparin for ACS. Cardiology consulted.   Today, 11/07/23 Repeat Heparin level = >1.1 is supratherapeutic on heparin infusion of 1200 units/hr Heparin level >1.1 @ 05:45 was erroneously drawn from line that heparin infusing through. Repeat lab ordered/obtained.  CBC: Hgb slightly low/decreased, Plt WNL Confirmed with RN - no signs of bleeding Troponins 2175  Goal of Therapy:  Heparin level 0.3-0.7 units/ml Monitor platelets by anticoagulation protocol: Yes   Plan:  Hold heparin infusion for one hour Decrease rate of heparin to 1000 units/hr Check 8 hour heparin level CBC, heparin level daily Monitor for signs of bleeding  Ronal CHRISTELLA Rav, PharmD 11/07/2023,9:36 AM

## 2023-11-08 ENCOUNTER — Inpatient Hospital Stay (HOSPITAL_COMMUNITY)

## 2023-11-08 ENCOUNTER — Encounter (HOSPITAL_COMMUNITY): Payer: Self-pay | Admitting: Pulmonary Disease

## 2023-11-08 ENCOUNTER — Encounter (HOSPITAL_COMMUNITY): Admission: EM | Disposition: A | Discharge status: Home / Self Care | Payer: Self-pay | Source: Home / Self Care

## 2023-11-08 DIAGNOSIS — R570 Cardiogenic shock: Secondary | ICD-10-CM | POA: Diagnosis not present

## 2023-11-08 DIAGNOSIS — N1832 Chronic kidney disease, stage 3b: Secondary | ICD-10-CM | POA: Diagnosis not present

## 2023-11-08 DIAGNOSIS — I509 Heart failure, unspecified: Secondary | ICD-10-CM | POA: Diagnosis not present

## 2023-11-08 DIAGNOSIS — I2699 Other pulmonary embolism without acute cor pulmonale: Secondary | ICD-10-CM | POA: Diagnosis not present

## 2023-11-08 DIAGNOSIS — I214 Non-ST elevation (NSTEMI) myocardial infarction: Secondary | ICD-10-CM | POA: Diagnosis not present

## 2023-11-08 DIAGNOSIS — R930 Abnormal findings on diagnostic imaging of skull and head, not elsewhere classified: Secondary | ICD-10-CM | POA: Diagnosis not present

## 2023-11-08 DIAGNOSIS — N179 Acute kidney failure, unspecified: Secondary | ICD-10-CM | POA: Diagnosis not present

## 2023-11-08 DIAGNOSIS — R609 Edema, unspecified: Secondary | ICD-10-CM

## 2023-11-08 DIAGNOSIS — I5082 Biventricular heart failure: Secondary | ICD-10-CM | POA: Diagnosis not present

## 2023-11-08 HISTORY — PX: IR INFUSION THROMBOL ARTERIAL INITIAL (MS): IMG5376

## 2023-11-08 HISTORY — PX: RIGHT HEART CATH: CATH118263

## 2023-11-08 HISTORY — PX: IR US GUIDE VASC ACCESS RIGHT: IMG2390

## 2023-11-08 HISTORY — PX: IR ANGIOGRAM PULMONARY BILATERAL SELECTIVE: IMG664

## 2023-11-08 HISTORY — PX: IR ANGIOGRAM SELECTIVE EACH ADDITIONAL VESSEL: IMG667

## 2023-11-08 LAB — COOXEMETRY PANEL
Carboxyhemoglobin: 2 % — ABNORMAL HIGH (ref 0.5–1.5)
Methemoglobin: <0.7 % (ref 0.0–1.5)
O2 Saturation: 64 %
Total hemoglobin: 9.1 g/dL — ABNORMAL LOW (ref 12.0–16.0)

## 2023-11-08 LAB — COMPREHENSIVE METABOLIC PANEL WITH GFR
ALT: 17 U/L (ref 0–44)
AST: 60 U/L — ABNORMAL HIGH (ref 15–41)
Albumin: 2.9 g/dL — ABNORMAL LOW (ref 3.5–5.0)
Alkaline Phosphatase: 47 U/L (ref 38–126)
Anion gap: 14 (ref 5–15)
BUN: 36 mg/dL — ABNORMAL HIGH (ref 8–23)
CO2: 22 mmol/L (ref 22–32)
Calcium: 8.6 mg/dL — ABNORMAL LOW (ref 8.9–10.3)
Chloride: 103 mmol/L (ref 98–111)
Creatinine, Ser: 2.77 mg/dL — ABNORMAL HIGH (ref 0.44–1.00)
GFR, Estimated: 18 mL/min — ABNORMAL LOW (ref >60)
Glucose, Bld: 106 mg/dL — ABNORMAL HIGH (ref 70–99)
Potassium: 3.8 mmol/L (ref 3.5–5.1)
Sodium: 139 mmol/L (ref 135–145)
Total Bilirubin: 0.9 mg/dL (ref 0.0–1.2)
Total Protein: 6.8 g/dL (ref 6.5–8.1)

## 2023-11-08 LAB — POCT I-STAT EG7
Acid-base deficit: 2 mmol/L (ref 0.0–2.0)
Acid-base deficit: 3 mmol/L — ABNORMAL HIGH (ref 0.0–2.0)
Bicarbonate: 23 mmol/L (ref 20.0–28.0)
Bicarbonate: 23.1 mmol/L (ref 20.0–28.0)
Calcium, Ion: 1.22 mmol/L (ref 1.15–1.40)
Calcium, Ion: 1.24 mmol/L (ref 1.15–1.40)
HCT: 29 % — ABNORMAL LOW (ref 36.0–46.0)
HCT: 29 % — ABNORMAL LOW (ref 36.0–46.0)
Hemoglobin: 9.9 g/dL — ABNORMAL LOW (ref 12.0–15.0)
Hemoglobin: 9.9 g/dL — ABNORMAL LOW (ref 12.0–15.0)
O2 Saturation: 58 %
O2 Saturation: 62 %
Potassium: 4.4 mmol/L (ref 3.5–5.1)
Potassium: 4.5 mmol/L (ref 3.5–5.1)
Sodium: 141 mmol/L (ref 135–145)
Sodium: 142 mmol/L (ref 135–145)
TCO2: 24 mmol/L (ref 22–32)
TCO2: 24 mmol/L (ref 22–32)
pCO2, Ven: 41.6 mmHg — ABNORMAL LOW (ref 44–60)
pCO2, Ven: 41.7 mmHg — ABNORMAL LOW (ref 44–60)
pH, Ven: 7.349 (ref 7.25–7.43)
pH, Ven: 7.352 (ref 7.25–7.43)
pO2, Ven: 32 mmHg (ref 32–45)
pO2, Ven: 34 mmHg (ref 32–45)

## 2023-11-08 LAB — CBC
HCT: 28.7 % — ABNORMAL LOW (ref 36.0–46.0)
Hemoglobin: 8.8 g/dL — ABNORMAL LOW (ref 12.0–15.0)
MCH: 25.9 pg — ABNORMAL LOW (ref 26.0–34.0)
MCHC: 30.7 g/dL (ref 30.0–36.0)
MCV: 84.4 fL (ref 80.0–100.0)
Platelets: 182 K/uL (ref 150–400)
RBC: 3.4 MIL/uL — ABNORMAL LOW (ref 3.87–5.11)
RDW: 18.2 % — ABNORMAL HIGH (ref 11.5–15.5)
WBC: 7.5 K/uL (ref 4.0–10.5)
nRBC: 0 % (ref 0.0–0.2)

## 2023-11-08 LAB — URINE CULTURE: Culture: >=100000 — AB

## 2023-11-08 LAB — IRON AND TIBC
Iron: 17 ug/dL — ABNORMAL LOW (ref 28–170)
Saturation Ratios: 6 % — ABNORMAL LOW (ref 10.4–31.8)
TIBC: 293 ug/dL (ref 250–450)
UIBC: 276 ug/dL

## 2023-11-08 LAB — HEPARIN LEVEL (UNFRACTIONATED): Heparin Unfractionated: 0.87 [IU]/mL — ABNORMAL HIGH (ref 0.30–0.70)

## 2023-11-08 LAB — PHOSPHORUS: Phosphorus: 5.1 mg/dL — ABNORMAL HIGH (ref 2.5–4.6)

## 2023-11-08 LAB — FERRITIN: Ferritin: 21 ng/mL (ref 11–307)

## 2023-11-08 LAB — MAGNESIUM: Magnesium: 1.9 mg/dL (ref 1.7–2.4)

## 2023-11-08 LAB — LIPOPROTEIN A (LPA): Lipoprotein (a): 62.5 nmol/L — ABNORMAL HIGH (ref <75.0)

## 2023-11-08 SURGERY — RIGHT HEART CATH
Anesthesia: LOCAL

## 2023-11-08 MED ORDER — LABETALOL HCL 5 MG/ML IV SOLN: 10.0000 mg | INTRAVENOUS | Status: AC | PRN

## 2023-11-08 MED ORDER — MORPHINE SULFATE (PF) 2 MG/ML IV SOLN: 5.0000 mg | INTRAVENOUS | Status: DC | PRN

## 2023-11-08 MED ORDER — HYDRALAZINE HCL 20 MG/ML IJ SOLN: 10.0000 mg | INTRAMUSCULAR | Status: AC | PRN

## 2023-11-08 MED ORDER — POTASSIUM CHLORIDE CRYS ER 20 MEQ PO TBCR
40.0000 meq | EXTENDED_RELEASE_TABLET | Freq: Once | ORAL | Status: AC
  Administered 2023-11-08: 40 meq via ORAL
  Filled 2023-11-08: qty 2

## 2023-11-08 MED ORDER — SODIUM CHLORIDE 0.9% FLUSH
3.0000 mL | Freq: Two times a day (BID) | INTRAVENOUS | Status: DC
  Administered 2023-11-08 – 2023-11-14 (×12): 3 mL via INTRAVENOUS

## 2023-11-08 MED ORDER — POLYETHYLENE GLYCOL 3350 17 G PO PACK
17.0000 g | PACK | Freq: Every day | ORAL | Status: DC
  Administered 2023-11-09 – 2023-11-10 (×2): 17 g via ORAL
  Filled 2023-11-08 (×2): qty 1

## 2023-11-08 MED ORDER — ONDANSETRON HCL 4 MG/2ML IJ SOLN: 4.0000 mg | Freq: Four times a day (QID) | INTRAMUSCULAR | Status: DC | PRN

## 2023-11-08 MED ORDER — MIDAZOLAM HCL (PF) 2 MG/2ML IJ SOLN: 1.0000 mg | INTRAMUSCULAR | Status: DC | PRN

## 2023-11-08 MED ORDER — MIDAZOLAM HCL (PF) 2 MG/2ML IJ SOLN
INTRAMUSCULAR | Status: AC | PRN
  Administered 2023-11-08: 1 mg via INTRAVENOUS

## 2023-11-08 MED ORDER — SODIUM CHLORIDE 0.9 % IV SOLN: INTRAVENOUS | Status: AC

## 2023-11-08 MED ORDER — SODIUM CHLORIDE 0.9% FLUSH: 3.0000 mL | INTRAVENOUS | Status: DC | PRN

## 2023-11-08 MED ORDER — HEPARIN (PORCINE) IN NACL 2-0.9 UNITS/ML: INTRAMUSCULAR | Status: DC

## 2023-11-08 MED ORDER — FUROSEMIDE 10 MG/ML IJ SOLN
80.0000 mg | Freq: Once | INTRAMUSCULAR | Status: AC
  Administered 2023-11-08: 80 mg via INTRAVENOUS
  Filled 2023-11-08: qty 8

## 2023-11-08 MED ORDER — HEPARIN (PORCINE) 25000 UT/250ML-% IV SOLN
800.0000 [IU]/h | INTRAVENOUS | Status: DC
Start: 2023-11-08 — End: 2023-11-08

## 2023-11-08 MED ORDER — LIDOCAINE HCL 1 % IJ SOLN
20.0000 mL | Freq: Once | INTRAMUSCULAR | Status: AC
  Administered 2023-11-08: 5 mL

## 2023-11-08 MED ORDER — ONDANSETRON HCL 4 MG/2ML IJ SOLN
4.0000 mg | Freq: Four times a day (QID) | INTRAMUSCULAR | Status: DC | PRN
  Filled 2023-11-08: qty 2

## 2023-11-08 MED ORDER — SODIUM CHLORIDE 0.9 % IV SOLN: 250.0000 mL | INTRAVENOUS | Status: AC | PRN

## 2023-11-08 MED ORDER — LIDOCAINE HCL 1 % IJ SOLN
INTRAMUSCULAR | Status: AC
  Filled 2023-11-08: qty 20

## 2023-11-08 MED ORDER — SODIUM CHLORIDE 0.9 % IV SOLN
0.5000 mg/h | Freq: Once | INTRAVENOUS | Status: AC
  Administered 2023-11-08: 0.5 mg/h via INTRAVENOUS
  Filled 2023-11-08: qty 24

## 2023-11-08 MED ORDER — HEPARIN (PORCINE) 25000 UT/250ML-% IV SOLN
1000.0000 [IU]/h | INTRAVENOUS | Status: DC
  Administered 2023-11-09: 650 [IU]/h via INTRAVENOUS
  Administered 2023-11-10: 900 [IU]/h via INTRAVENOUS
  Filled 2023-11-08 (×3): qty 250

## 2023-11-08 MED ORDER — TECHNETIUM TO 99M ALBUMIN AGGREGATED: 4.0000 | Freq: Once | INTRAVENOUS | Status: DC | PRN

## 2023-11-08 MED ORDER — FENTANYL CITRATE (PF) 100 MCG/2ML IJ SOLN
INTRAMUSCULAR | Status: AC
  Filled 2023-11-08: qty 2

## 2023-11-08 MED ORDER — SODIUM CHLORIDE 0.9 % IV SOLN
0.5000 mg/h | Freq: Once | INTRAVENOUS | Status: AC
  Administered 2023-11-08: 0.5 mg/h via INTRAVENOUS
  Filled 2023-11-08 (×2): qty 24

## 2023-11-08 MED ORDER — ACETAMINOPHEN 325 MG PO TABS
650.0000 mg | ORAL_TABLET | ORAL | Status: DC | PRN
  Administered 2023-11-08: 650 mg via ORAL
  Filled 2023-11-08: qty 2

## 2023-11-08 MED ORDER — LIDOCAINE HCL (PF) 1 % IJ SOLN
INTRAMUSCULAR | Status: DC | PRN
  Administered 2023-11-08: 2 mL

## 2023-11-08 MED ORDER — HEPARIN (PORCINE) IN NACL 1000-0.9 UT/500ML-% IV SOLN
INTRAVENOUS | Status: DC | PRN
  Administered 2023-11-08: 500 mL

## 2023-11-08 MED ORDER — TECHNETIUM TO 99M ALBUMIN AGGREGATED
4.4000 | Freq: Once | INTRAVENOUS | Status: AC
  Administered 2023-11-08: 4.4 via INTRAVENOUS

## 2023-11-08 MED ORDER — SODIUM CHLORIDE 0.9 % IV SOLN
0.5000 mg/h | INTRAVENOUS | Status: DC
  Filled 2023-11-08: qty 24

## 2023-11-08 MED ORDER — FENTANYL CITRATE (PF) 100 MCG/2ML IJ SOLN
INTRAMUSCULAR | Status: AC | PRN
  Administered 2023-11-08: 50 ug via INTRAVENOUS

## 2023-11-08 MED ORDER — SENNOSIDES-DOCUSATE SODIUM 8.6-50 MG PO TABS
2.0000 | ORAL_TABLET | Freq: Every day | ORAL | Status: DC
  Administered 2023-11-09 – 2023-11-15 (×6): 2 via ORAL
  Filled 2023-11-08 (×7): qty 2

## 2023-11-08 MED ORDER — MIDAZOLAM HCL 2 MG/2ML IJ SOLN
INTRAMUSCULAR | Status: AC
  Filled 2023-11-08: qty 2

## 2023-11-08 SURGICAL SUPPLY — 6 items
CATH SWAN GANZ 7F STRAIGHT (CATHETERS) IMPLANT
GLIDESHEATH SLENDER 7FR .021G (SHEATH) IMPLANT
GUIDEWIRE .025 260CM (WIRE) IMPLANT
PACK CARDIAC CATHETERIZATION (CUSTOM PROCEDURE TRAY) ×1 IMPLANT
TRANSDUCER W/STOPCOCK (MISCELLANEOUS) IMPLANT
TUBING ART PRESS 72 MALE/FEM (TUBING) IMPLANT

## 2023-11-08 NOTE — Progress Notes (Signed)
 PHARMACY - ANTICOAGULATION CONSULT NOTE  Pharmacy Consult for heparin Indication: chest pain/ACS  Allergies  Allergen Reactions   Lisinopril Cough    Patient Measurements: Weight: 103.9 kg (229 lb 0.9 oz) Heparin dosing weight: 86 kg  Vital Signs: Temp: 97.6 F (36.4 C) (10/24 0335) Temp Source: Oral (10/24 1200) BP: 107/48 (10/24 1140) Pulse Rate: 58 (10/24 1140)  Labs: Recent Labs    11/06/23 1733 11/07/23 0545 11/07/23 0546 11/07/23 0750 11/07/23 1508 11/07/23 1647 11/07/23 2028 11/08/23 0445 11/08/23 0930  HGB 10.8*  --  9.1*  --   --   --   --  8.8*  --   HCT 36.1  --  30.9*  --   --   --   --  28.7*  --   PLT 222  --  179  --   --   --   --  182  --   HEPARINUNFRC  --    < >  --  >1.10*  --   --  >1.10*  --  0.87*  CREATININE 2.06*  --  2.36*  --  2.98*  --   --  2.77*  --   TROPONINIHS  --   --   --   --  8,901* 1,139*  --   --   --    < > = values in this interval not displayed.    Estimated Creatinine Clearance: 21.6 mL/min (A) (by C-G formula based on SCr of 2.77 mg/dL (H)).   Medical History: Past Medical History:  Diagnosis Date   Arthritis    Atypical ductal hyperplasia of right breast    Breast cancer (HCC) 01/25/2021   Family history of ovarian cancer    Family history of pancreatic cancer    Family history of prostate cancer    Family history of stomach cancer    GERD (gastroesophageal reflux disease)    Hyperlipidemia    Hypertension    Personal history of radiation therapy     Medications: No anticoagulants PTA  Assessment: Patient is a 50 yoF admitted for cardiogenic shock. Pharmacy consulted for heparin dosing for NSTEMI. Since patient is in AKI, will hold off on LHC due to potential worsening of AKI with contrast dye. Plan to proceed with cardiac MRI today to evaluate the coronary arteries.   Pt s/p RHC, heparin to be resumed now.   Goal of Therapy:  Heparin level 0.3-0.7 units/ml Monitor platelets by anticoagulation protocol:  Yes   Plan:  -Resume heparin 650 units/h no bolus -Repeat heparin level in 8h  Ozell Jamaica, PharmD, [place], [facility] Clinical Pharmacist [phone] Please check AMION for all [facility] Pharmacy numbers 11/08/2023

## 2023-11-08 NOTE — Progress Notes (Addendum)
 Advanced Heart Failure Rounding Note  Cardiologist: None  Chief Complaint: BiV HF Subjective:    Co-ox 64%, CVP 13-14. On milrinone 0.25 mcg/kg/min I/Os not complete. First weight this am.  sCr 2.98>2.77 Lactic acid 3.9>2.9>1.2 Plan for RHC today  Lying in bed. No SOB or CP. Does have slight headache, can given tylenol .  Objective:    Weight Range: 103.9 kg Body mass index is 38.12 kg/m.   Vital Signs:   Temp:  [97.6 F (36.4 C)-98.2 F (36.8 C)] 97.6 F (36.4 C) (10/24 0335) Pulse Rate:  [50-69] 60 (10/24 0700) Resp:  [9-28] 13 (10/24 0700) BP: (73-145)/(40-115) 102/63 (10/24 0700) SpO2:  [90 %-100 %] 96 % (10/24 0700) Weight:  [103.9 kg-107.6 kg] 103.9 kg (10/24 0500) Last BM Date :  (PTA)  Weight change: Filed Weights   11/07/23 2123 11/08/23 0500  Weight: 107.6 kg 103.9 kg   Intake/Output:  Intake/Output Summary (Last 24 hours) at 11/08/2023 0739 Last data filed at 11/08/2023 0700 Gross per 24 hour  Intake 346.97 ml  Output --  Net 346.97 ml    Physical Exam    General: Elderly appearing. No distress on [place] Cardiac: S1 and S2 present. S3 present Resp: Lung sounds clear and equal B/L  Extremities: Warm and dry.  traceBLE edema.  Neuro: Alert and oriented x3. Affect pleasant.   Telemetry   SR 60s occasional PVCs (personally reviewed)  Labs    CBC Recent Labs    11/06/23 1733 11/07/23 0546 11/08/23 0445  WBC 7.9 7.4 7.5  NEUTROABS 6.2  --   --   HGB 10.8* 9.1* 8.8*  HCT 36.1 30.9* 28.7*  MCV 86.8 88.3 84.4  PLT 222 179 182   Basic Metabolic Panel Recent Labs    89/76/74 1508 11/08/23 0445  NA 143 139  K 4.8 3.8  CL 109 103  CO2 19* 22  GLUCOSE 93 106*  BUN 32* 36*  CREATININE 2.98* 2.77*  CALCIUM 8.8* 8.6*  MG 2.0 1.9  PHOS  --  5.1*   Liver Function Tests Recent Labs    11/07/23 1508 11/08/23 0445  AST 66* 60*  ALT 15 17  ALKPHOS 48 47  BILITOT 0.6 0.9  PROT 7.5 6.8  ALBUMIN 3.2* 2.9*   ProBNP (last 3  results) Recent Labs    11/06/23 1733  PROBNP >35,000.0*   Hemoglobin A1C Recent Labs    11/07/23 1508  HGBA1C 5.0   Fasting Lipid Panel Recent Labs    11/07/23 1508  CHOL 166  HDL 45  LDLCALC 96  TRIG 125  CHOLHDL 3.7   Thyroid Function Tests Recent Labs    11/06/23 1733  TSH 2.230   Medications:    Scheduled Medications:  aspirin EC  81 mg Oral Daily   atorvastatin  40 mg Oral QPM   Chlorhexidine  Gluconate Cloth  6 each Topical Daily   pantoprazole  40 mg Oral Daily   pregabalin  150 mg Oral BID   sodium chloride  flush  3 mL Intravenous Q12H    Infusions:  sodium chloride      sodium chloride      cefTRIAXone (ROCEPHIN)  IV Stopped (11/07/23 1928)   heparin 800 Units/hr (11/08/23 0700)   milrinone 0.25 mcg/kg/min (11/08/23 0700)   norepinephrine (LEVOPHED) Adult infusion      PRN Medications: sodium chloride , acetaminophen , ondansetron  (ZOFRAN ) IV, sodium chloride  flush  Patient Profile   Vickie Collier 73 y.o. female with history of HTN, HLD, breast cancer s/p  radiation on letrozole , GERD, arthritis, CKD IIIb. Evaluation for cardiogenic shock.  Assessment/Plan   Acute biventricular heart failure  Predominantly RVF  SCAI Stage C Cardiogenic shock  - Etiology not clear. ? RV infarct. HS troponin elevated to >2500. However, denies clear angina. PE also a consideration but lower on differential. Family notes hx snoring, strong suspicion for longstanding untreated OSA. Will eventually need sleep study. - Echo 10/23: EF 35-40%, septal flattening, severely reduced RV, RA severely dilated, severe TR, dilated IVC - Plan for RHC today. Ideally would arrange LHC as well but will need to hold off for now d/t AKI - CVP 13-14, with S3. Co-ox 64%. Lactic acid 3.9>2.9>1.2 - Give Lasix 80 mg IV once, redose pending RHC results - On milrinone 0.25; continue - may benefit from CMR to eval cors, will discuss with Dr. Cherrie   NSTEMI - Unclear if type I or type II  myocardial injury. HS troponin not quite as high as would expect with RV infarct - continue heparin - continue ASA + statin - holding off on LHC for now d/t AKI and not having active angina - with inability to perform LHC, may benefit from CMR   AKI on CKD IIIb - sCr baseline around 1.3 - Cr 2.06>2.98>2.77 - slowly improving with diuresis   UTI - Primary team treating with ceftriaxone - urine culture pending; BCX ngtd (<12H)   Anemia - Hgb 10.8>9>8.8 - Trend closely, watch for bleeding - check FOBT - tsat 6; plan for IV Fe, once need for CMR determined  Length of Stay: 2  [place] Lee, NP  11/08/2023, 7:39 AM  Advanced Heart Failure Team Pager [phone] (M-F; 7a - 5p)  Please contact [facility] for night-coverage after hours (5p -7a ) and weekends on [email or web address]   Agree with above.   Now on milrinone. Co-ox improved. Lactic acid has cleared.  CVP 13-14  AKI slowly improving.   No CP or SOB.  L/E us  + for L DVT  ECG with diffuse TWI   Struggling with urinary retention   General:  Elderly  No resp difficulty HEENT: normal Neck: supple. JVP to ear . Carotids 2+ bilat; no bruits. No lymphadenopathy or thryomegaly appreciated. Cor: PMI nondisplaced. Regular rate & rhythm. No rubs, gallops or murmurs. Lungs: clear Abdomen: soft, nontender, nondistended. No hepatosplenomegaly. No bruits or masses. Good bowel sounds. Extremities: no cyanosis, clubbing, rash, 1+ edema Neuro: alert & orientedx3, cranial nerves grossly intact. moves all 4 extremities w/o difficulty. Affect pleasant  Suspect ischemic event (vs PE) but unable to do coronary angio at this time with AKI. Will continue milrinone. Plan RHC today. Will check VQ. If Scr normalizes cor angio Monday.   Continue heparin. Diurese as tolerated  CRITICAL CARE Performed by: Dhillon Comunale  Total critical care time: 45 minutes  Critical care time was exclusive of separately billable procedures and treating  other patients.  Critical care was necessary to treat or prevent imminent or life-threatening deterioration.  Critical care was time spent personally by me (independent of midlevel providers or residents) on the following activities: development of treatment plan with patient and/or surrogate as well as nursing, discussions with consultants, evaluation of patient's response to treatment, examination of patient, obtaining history from patient or surrogate, ordering and performing treatments and interventions, ordering and review of laboratory studies, ordering and review of radiographic studies, pulse oximetry and re-evaluation of patient's condition.  Toribio Cherrie, MD  11:48 AM

## 2023-11-08 NOTE — Progress Notes (Signed)
   VQ results reviewed high burden of bilateral PE  RHC shows severe PAH with cor pulmonale and compromised output despite inotropic support.   I reviewed case with CCM and agree with need for clot lysis vs extraction.   She is too high risk for systemic lytics  Given AKI due to ATN I am concerned about additional contrast burden with CT +/- angio/thrombectomy and have raised the possibility of catheter-directed lytics.   I spoke with IR who will evaluate and decide best approach to treat her high PE burden.   Family aware  Additional CCT 65 mins  Toribio Fuel, MD  5:24 PM

## 2023-11-08 NOTE — Progress Notes (Addendum)
 NAME:  Vickie Collier, MRN:  [id number], DOB:  February 07, 1950, LOS: 2 ADMISSION DATE:  11/06/2023, CONSULTATION DATE: 11/07/2023 REFERRING MD: [place] CHIEF COMPLAINT: General Weakness, cardiogenic shock  History of Present Illness:  73 yo F PMH HTN HLD breast ca on letrozole  presented to [facility] ED 11/06/23 w CC weakness x1wk + associated SOB, no dizziness, repots some exertional CP and having to sleep sitting up in the recliner   Notably, Was seen in the ED recently  (10/18) w knee pain and dizziness, at that time had an EKG w new T wave inversion and was rec admission/obvs but pt declined and went home instead .  In ED 10/22 elevated trop 1349, markedly elevated pro BNP >35k. Cards engaged by ED and was Started on hep gtt and ASA load.  Admitted to [facility]  On 10/23 pt seen by cards. Pt w ongoing rising trops and progressive hypotension. ECHO w LVEF 35-40% g1 diastolic failure, septal flattening, severely reduced RV failure, moderate RV enlargement and moderately elevated PASP   + Severe TR, mild AS    PCCM consulted in this setting   Pertinent  Medical History  HTN, HLD, HFrEF  Significant Hospital Events: Including procedures, antibiotic start and stop dates in addition to other pertinent events   10/18 ED w dizziness, new EKG changes admission recommended but pt refused 10/22 ED w weakness. Markedly elevated BNP and elevated trop and elevated LA. Hep gtt asa for NSTEMI, [facility] admit 10/23 incr trops, echo w biv failure, seen by cards. Progressive hypotension, pccm consult   Interim History / Subjective:  No acute events overnight, improving perfusion markers lactic acid 1.2, mixed venous 64  Objective    Blood pressure 102/63, pulse 60, temperature 97.6 F (36.4 C), temperature source Oral, resp. rate 13, weight 103.9 kg, SpO2 96%. CVP:  [6 mmHg-22 mmHg] 8 mmHg      Intake/Output Summary (Last 24 hours) at 11/08/2023 0810 Last data filed at 11/08/2023 0700 Gross per 24 hour  Intake  346.97 ml  Output --  Net 346.97 ml   Filed Weights   11/07/23 2123 11/08/23 0500  Weight: 107.6 kg 103.9 kg    Examination: General: Resting in bed, well-nourished HENT: NCAT Lungs: Symmetrical chest wall movement, clear to auscultation Cardiovascular: RRR, S1-S2, no regurg Abdomen: Soft nontender nondistended Extremities: Mild edema Neuro: AAO X3, mentating well, intact motor and sensation GU: Deferred  Resolved problem list   Assessment and Plan   High suspicion of intermediate high PE -troponin leak, proBNP, lactic acid, severe RV dysfunction with RVSP  57 mmHg NSTEMI-renal function precludes LHC at this time Cardiogenic shock-presented with cool extremities, and echo showing mild LV and severe RV dysfunction, initial mixed venous 55, lactate 3.2-resolved Echo-LVEF 25%, severely reduced RV systolic function plus RV dilation, septal flattening with severe TR PE not ruled out-duplex shows DVT of LLE AKI on CKD 3B HTN HLD Breast CA s/p resection plus radiation on letrozole     Plan - Right heart cath today,  -CTA to R/out PE - Continue heparin GTT, and milrinone 0.25 - Continue diuresis per HF team - Trend perfusion markers including lactic acid and mixed venous - Will use vasopressors and wean off as tolerated to maintain MAP> 65 - Strict I&O's, Foley - Difficult Foley, consulted urology  Labs   CBC: Recent Labs  Lab 11/02/23 1843 11/06/23 1733 11/07/23 0546 11/08/23 0445  WBC 6.1 7.9 7.4 7.5  NEUTROABS 5.0 6.2  --   --   HGB  10.8* 10.8* 9.1* 8.8*  HCT 34.3* 36.1 30.9* 28.7*  MCV 81.9 86.8 88.3 84.4  PLT 254 222 179 182    Basic Metabolic Panel: Recent Labs  Lab 11/02/23 1843 11/06/23 1733 11/07/23 0546 11/07/23 1508 11/08/23 0445  NA 145 144 146* 143 139  K 3.5 4.3 4.1 4.8 3.8  CL 102 107 110 109 103  CO2 21* 24 22 19* 22  GLUCOSE 93 122* 96 93 106*  BUN 14 26* 29* 32* 36*  CREATININE 1.42* 2.06* 2.36* 2.98* 2.77*  CALCIUM 10.3 9.4 8.6*  8.8* 8.6*  MG  --   --   --  2.0 1.9  PHOS  --   --   --   --  5.1*   GFR: Estimated Creatinine Clearance: 21.6 mL/min (A) (by C-G formula based on SCr of 2.77 mg/dL (H)). Recent Labs  Lab 11/02/23 1843 11/06/23 1733 11/06/23 1741 11/06/23 1954 11/07/23 0546 11/07/23 0800 11/07/23 1508 11/07/23 1750 11/08/23 0445  PROCALCITON  --   --   --   --   --  0.43  --   --   --   WBC 6.1 7.9  --   --  7.4  --   --   --  7.5  LATICACIDVEN  --   --  3.9* 2.9*  --   --  3.2* 1.2  --     Liver Function Tests: Recent Labs  Lab 11/02/23 1843 11/06/23 1733 11/07/23 1508 11/08/23 0445  AST 21 47* 66* 60*  ALT 6 6 15 17   ALKPHOS 71 65 48 47  BILITOT 1.1 0.5 0.6 0.9  PROT 8.9* 7.8 7.5 6.8  ALBUMIN 4.2 3.7 3.2* 2.9*   No results for input(s): LIPASE, AMYLASE in the last 168 hours. No results for input(s): AMMONIA in the last 168 hours.  ABG    Component Value Date/Time   O2SAT 64 11/08/2023 0445     Coagulation Profile: No results for input(s): INR, PROTIME in the last 168 hours.  Cardiac Enzymes: Recent Labs  Lab 11/02/23 1843  CKTOTAL 84    HbA1C: Hgb A1c MFr Bld  Date/Time Value Ref Range Status  11/07/2023 03:08 PM 5.0 4.8 - 5.6 % Final    Comment:    (NOTE) Diagnosis of Diabetes The following HbA1c ranges recommended by the American Diabetes Association (ADA) may be used as an aid in the diagnosis of diabetes mellitus.  Hemoglobin             Suggested A1C NGSP%              Diagnosis  <5.7                   Non Diabetic  5.7-6.4                Pre-Diabetic  >6.4                   Diabetic  <7.0                   Glycemic control for                       adults with diabetes.      CBG: Recent Labs  Lab 11/02/23 1834 11/07/23 1620  GLUCAP 99 88    Review of Systems:   Negative except above  Past Medical History:  She,  has a past medical history of Arthritis, Atypical ductal  hyperplasia of right breast, Breast cancer (HCC)  (01/25/2021), Family history of ovarian cancer, Family history of pancreatic cancer, Family history of prostate cancer, Family history of stomach cancer, GERD (gastroesophageal reflux disease), Hyperlipidemia, Hypertension, and Personal history of radiation therapy.   Surgical History:   Past Surgical History:  Procedure Laterality Date   ABDOMINAL HYSTERECTOMY     BREAST LUMPECTOMY     JOINT REPLACEMENT Bilateral    TKR   RADIOACTIVE SEED GUIDED EXCISIONAL BREAST BIOPSY Right 01/25/2021   Procedure: RADIOACTIVE SEED GUIDED EXCISIONAL RIGHT BREAST BIOPSY;  Surgeon: Aron Shoulders, MD;  Location: [facility];  Service: General;  Laterality: Right;   RE-EXCISION OF BREAST LUMPECTOMY Right 02/14/2021   Procedure: RE-EXCISION OF RIGHT BREAST LUMPECTOMY;  Surgeon: Aron Shoulders, MD;  Location:  SURGERY CENTER;  Service: General;  Laterality: Right;     Social History:   reports that she has never smoked. She has never used smokeless tobacco. She reports that she does not currently use alcohol. She reports that she does not use drugs.   Family History:  Her family history includes Cancer in her cousin; Lung cancer in her cousin; Lupus in her father; Ovarian cancer (age of onset: 45) in her maternal grandmother; Pancreatic cancer in her cousin; Prostate cancer (age of onset: 42) in her half-brother; Stomach cancer in her cousin.   Allergies Allergies  Allergen Reactions   Lisinopril Cough     Home Medications  Prior to Admission medications   Medication Sig Start Date End Date Taking? Authorizing Provider  amLODipine (NORVASC) 10 MG tablet Take 10 mg by mouth daily.   Yes [provider]  baclofen (LIORESAL) 10 MG tablet Take 10 mg by mouth 2 (two) times daily.   Yes [provider]  cyanocobalamin 100 MCG tablet Take 100 mcg by mouth daily.   Yes [provider]  felodipine (PLENDIL) 5 MG 24 hr tablet Take 5 mg by mouth daily.   Yes  [provider]  letrozole  (FEMARA ) 2.5 MG tablet Take 1 tablet (2.5 mg total) by mouth daily. 02/19/23  Yes Gudena, Vinay, MD  metoprolol succinate (TOPROL-XL) 50 MG 24 hr tablet Take 50 mg by mouth daily. Take with or immediately following a meal.   Yes [provider]  nebivolol (BYSTOLIC) 10 MG tablet Take 10 mg by mouth daily. 08/20/23  Yes [provider]  omeprazole (PRILOSEC) 40 MG capsule Take 40 mg by mouth 2 (two) times daily as needed. 05/14/23  Yes [provider]  pregabalin (LYRICA) 150 MG capsule Take 1 capsule (150 mg total) by mouth 2 (two) times daily. 11/03/23  Yes Kammerer, Megan L, DO  rosuvastatin (CRESTOR) 5 MG tablet Take 5 mg by mouth daily.   Yes [provider]  valsartan-hydrochlorothiazide (DIOVAN-HCT) 80-12.5 MG tablet Take 1 tablet by mouth daily.   Yes [provider]  cholecalciferol (VITAMIN D3) 25 MCG (1000 UNIT) tablet Take 1,000 Units by mouth daily.    [provider]  lidocaine  4 % Place 1 patch onto the skin daily. Patient not taking: Reported on 11/06/2023 12/21/22   Neysa Caron PARAS, DO     Critical care time: 40 mins       Lenny Drought, MD  [facility] Prefer epic messenger for cross cover needs   My critical care time: 40 minutes  Critical care time was exclusive of separately billable procedures and treating other patients.  Critical care was necessary to treat or prevent  imminent or life-threatening deterioration.  Critical care was time spent personally by me on the following activities: development of treatment plan with patient and/or surrogate as well as nursing, discussions with consultants, evaluation of patient's response to treatment, examination of patient, obtaining history from patient or surrogate, ordering and performing treatments and interventions, ordering and review of laboratory studies, ordering and review of radiographic studies, pulse  oximetry, re-evaluation of patient's condition and participation in multidisciplinary rounds.  11/08/2023, 11:43 AM

## 2023-11-08 NOTE — Progress Notes (Signed)
 PHARMACY - ANTICOAGULATION CONSULT NOTE  Pharmacy Consult for heparin Indication: PE  Allergies  Allergen Reactions   Lisinopril Cough    Patient Measurements: Weight: 103.9 kg (229 lb 0.9 oz) Heparin dosing weight: 86 kg  Vital Signs: Temp: 97.5 F (36.4 C) (10/24 1545) Temp Source: Axillary (10/24 1600) BP: 121/60 (10/24 1945) Pulse Rate: 76 (10/24 1945)  Labs: Recent Labs    11/06/23 1733 11/07/23 0545 11/07/23 0546 11/07/23 0750 11/07/23 1508 11/07/23 1647 11/07/23 2028 11/08/23 0445 11/08/23 0930 11/08/23 1456  HGB 10.8*  --  9.1*  --   --   --   --  8.8*  --  9.9*  9.9*  HCT 36.1  --  30.9*  --   --   --   --  28.7*  --  29.0*  29.0*  PLT 222  --  179  --   --   --   --  182  --   --   HEPARINUNFRC  --    < >  --  >1.10*  --   --  >1.10*  --  0.87*  --   CREATININE 2.06*  --  2.36*  --  2.98*  --   --  2.77*  --   --   TROPONINIHS  --   --   --   --  8,901* 1,139*  --   --   --   --    < > = values in this interval not displayed.    Estimated Creatinine Clearance: 21.6 mL/min (A) (by C-G formula based on SCr of 2.77 mg/dL (H)).   Medical History: Past Medical History:  Diagnosis Date   Arthritis    Atypical ductal hyperplasia of right breast    Breast cancer (HCC) 01/25/2021   Family history of ovarian cancer    Family history of pancreatic cancer    Family history of prostate cancer    Family history of stomach cancer    GERD (gastroesophageal reflux disease)    Hyperlipidemia    Hypertension    Personal history of radiation therapy     Medications: No anticoagulants PTA  Assessment: Patient is a 21 yoF admitted for cardiogenic shock. Pharmacy consulted for heparin dosing for NSTEMI. Since patient is in AKI, will hold off on LHC due to potential worsening of AKI with contrast dye. Plan to proceed with cardiac MRI today to evaluate the coronary arteries.   Heparin resumed s/p RHC with concerns for PE. Pt taken to IR for bilateral  thrombolysis, heparin continued.   Goal of Therapy:  Heparin level 0.3-0.7 units/ml Monitor platelets by anticoagulation protocol: Yes   Plan:  -Heparin 650 units/h -Alteplase 0.5mg /h + 0.5mg /h bilaterally -Heparin 2 units/ml infusing bilaterally as well for catheter maintenance (40 units heparin total) -Check q6h heparin level, CBC, fibrinogen  Ozell Jamaica, PharmD, BCPS, [facility] Clinical Pharmacist [phone] Please check AMION for all [facility] Pharmacy numbers 11/08/2023

## 2023-11-08 NOTE — H&P (View-Only) (Signed)
 Advanced Heart Failure Rounding Note  Cardiologist: None  Chief Complaint: BiV HF Subjective:    Co-ox 64%, CVP 13-14. On milrinone 0.25 mcg/kg/min I/Os not complete. First weight this am.  sCr 2.98>2.77 Lactic acid 3.9>2.9>1.2 Plan for RHC today  Lying in bed. No SOB or CP. Does have slight headache, can given tylenol .  Objective:    Weight Range: 103.9 kg Body mass index is 38.12 kg/m.   Vital Signs:   Temp:  [97.6 F (36.4 C)-98.2 F (36.8 C)] 97.6 F (36.4 C) (10/24 0335) Pulse Rate:  [50-69] 60 (10/24 0700) Resp:  [9-28] 13 (10/24 0700) BP: (73-145)/(40-115) 102/63 (10/24 0700) SpO2:  [90 %-100 %] 96 % (10/24 0700) Weight:  [103.9 kg-107.6 kg] 103.9 kg (10/24 0500) Last BM Date :  (PTA)  Weight change: Filed Weights   11/07/23 2123 11/08/23 0500  Weight: 107.6 kg 103.9 kg   Intake/Output:  Intake/Output Summary (Last 24 hours) at 11/08/2023 0739 Last data filed at 11/08/2023 0700 Gross per 24 hour  Intake 346.97 ml  Output --  Net 346.97 ml    Physical Exam    General: Elderly appearing. No distress on [place] Cardiac: S1 and S2 present. S3 present Resp: Lung sounds clear and equal B/L  Extremities: Warm and dry.  traceBLE edema.  Neuro: Alert and oriented x3. Affect pleasant.   Telemetry   SR 60s occasional PVCs (personally reviewed)  Labs    CBC Recent Labs    11/06/23 1733 11/07/23 0546 11/08/23 0445  WBC 7.9 7.4 7.5  NEUTROABS 6.2  --   --   HGB 10.8* 9.1* 8.8*  HCT 36.1 30.9* 28.7*  MCV 86.8 88.3 84.4  PLT 222 179 182   Basic Metabolic Panel Recent Labs    89/76/74 1508 11/08/23 0445  NA 143 139  K 4.8 3.8  CL 109 103  CO2 19* 22  GLUCOSE 93 106*  BUN 32* 36*  CREATININE 2.98* 2.77*  CALCIUM 8.8* 8.6*  MG 2.0 1.9  PHOS  --  5.1*   Liver Function Tests Recent Labs    11/07/23 1508 11/08/23 0445  AST 66* 60*  ALT 15 17  ALKPHOS 48 47  BILITOT 0.6 0.9  PROT 7.5 6.8  ALBUMIN 3.2* 2.9*   ProBNP (last 3  results) Recent Labs    11/06/23 1733  PROBNP >35,000.0*   Hemoglobin A1C Recent Labs    11/07/23 1508  HGBA1C 5.0   Fasting Lipid Panel Recent Labs    11/07/23 1508  CHOL 166  HDL 45  LDLCALC 96  TRIG 125  CHOLHDL 3.7   Thyroid Function Tests Recent Labs    11/06/23 1733  TSH 2.230   Medications:    Scheduled Medications:  aspirin EC  81 mg Oral Daily   atorvastatin  40 mg Oral QPM   Chlorhexidine  Gluconate Cloth  6 each Topical Daily   pantoprazole  40 mg Oral Daily   pregabalin  150 mg Oral BID   sodium chloride  flush  3 mL Intravenous Q12H    Infusions:  sodium chloride      sodium chloride      cefTRIAXone (ROCEPHIN)  IV Stopped (11/07/23 1928)   heparin 800 Units/hr (11/08/23 0700)   milrinone 0.25 mcg/kg/min (11/08/23 0700)   norepinephrine (LEVOPHED) Adult infusion      PRN Medications: sodium chloride , acetaminophen , ondansetron  (ZOFRAN ) IV, sodium chloride  flush  Patient Profile   Vickie Collier 73 y.o. female with history of HTN, HLD, breast cancer s/p  radiation on letrozole , GERD, arthritis, CKD IIIb. Evaluation for cardiogenic shock.  Assessment/Plan   Acute biventricular heart failure  Predominantly RVF  SCAI Stage C Cardiogenic shock  - Etiology not clear. ? RV infarct. HS troponin elevated to >2500. However, denies clear angina. PE also a consideration but lower on differential. Family notes hx snoring, strong suspicion for longstanding untreated OSA. Will eventually need sleep study. - Echo 10/23: EF 35-40%, septal flattening, severely reduced RV, RA severely dilated, severe TR, dilated IVC - Plan for RHC today. Ideally would arrange LHC as well but will need to hold off for now d/t AKI - CVP 13-14, with S3. Co-ox 64%. Lactic acid 3.9>2.9>1.2 - Give Lasix 80 mg IV once, redose pending RHC results - On milrinone 0.25; continue - may benefit from CMR to eval cors, will discuss with Dr. Cherrie   NSTEMI - Unclear if type I or type II  myocardial injury. HS troponin not quite as high as would expect with RV infarct - continue heparin - continue ASA + statin - holding off on LHC for now d/t AKI and not having active angina - with inability to perform LHC, may benefit from CMR   AKI on CKD IIIb - sCr baseline around 1.3 - Cr 2.06>2.98>2.77 - slowly improving with diuresis   UTI - Primary team treating with ceftriaxone - urine culture pending; BCX ngtd (<12H)   Anemia - Hgb 10.8>9>8.8 - Trend closely, watch for bleeding - check FOBT - tsat 6; plan for IV Fe, once need for CMR determined  Length of Stay: 2  [place] Lee, NP  11/08/2023, 7:39 AM  Advanced Heart Failure Team Pager [phone] (M-F; 7a - 5p)  Please contact [facility] for night-coverage after hours (5p -7a ) and weekends on [email or web address]   Agree with above.   Now on milrinone. Co-ox improved. Lactic acid has cleared.  CVP 13-14  AKI slowly improving.   No CP or SOB.  L/E us  + for L DVT  ECG with diffuse TWI   Struggling with urinary retention   General:  Elderly  No resp difficulty HEENT: normal Neck: supple. JVP to ear . Carotids 2+ bilat; no bruits. No lymphadenopathy or thryomegaly appreciated. Cor: PMI nondisplaced. Regular rate & rhythm. No rubs, gallops or murmurs. Lungs: clear Abdomen: soft, nontender, nondistended. No hepatosplenomegaly. No bruits or masses. Good bowel sounds. Extremities: no cyanosis, clubbing, rash, 1+ edema Neuro: alert & orientedx3, cranial nerves grossly intact. moves all 4 extremities w/o difficulty. Affect pleasant  Suspect ischemic event (vs PE) but unable to do coronary angio at this time with AKI. Will continue milrinone. Plan RHC today. Will check VQ. If Scr normalizes cor angio Monday.   Continue heparin. Diurese as tolerated  CRITICAL CARE Performed by: Dhillon Comunale  Total critical care time: 45 minutes  Critical care time was exclusive of separately billable procedures and treating  other patients.  Critical care was necessary to treat or prevent imminent or life-threatening deterioration.  Critical care was time spent personally by me (independent of midlevel providers or residents) on the following activities: development of treatment plan with patient and/or surrogate as well as nursing, discussions with consultants, evaluation of patient's response to treatment, examination of patient, obtaining history from patient or surrogate, ordering and performing treatments and interventions, ordering and review of laboratory studies, ordering and review of radiographic studies, pulse oximetry and re-evaluation of patient's condition.  Toribio Cherrie, MD  11:48 AM

## 2023-11-08 NOTE — Progress Notes (Signed)
 PHARMACY - ANTICOAGULATION CONSULT NOTE  Pharmacy Consult for heparin Indication: chest pain/ACS  Allergies  Allergen Reactions   Lisinopril Cough    Patient Measurements: Weight: 103.9 kg (229 lb 0.9 oz) Heparin dosing weight: 86 kg  Vital Signs: Temp: 97.6 F (36.4 C) (10/24 0335) Temp Source: Oral (10/24 0800) BP: 102/63 (10/24 0700) Pulse Rate: 60 (10/24 0700)  Labs: Recent Labs    11/06/23 1733 11/07/23 0545 11/07/23 0546 11/07/23 0750 11/07/23 1508 11/07/23 1647 11/07/23 2028 11/08/23 0445 11/08/23 0930  HGB 10.8*  --  9.1*  --   --   --   --  8.8*  --   HCT 36.1  --  30.9*  --   --   --   --  28.7*  --   PLT 222  --  179  --   --   --   --  182  --   HEPARINUNFRC  --    < >  --  >1.10*  --   --  >1.10*  --  0.87*  CREATININE 2.06*  --  2.36*  --  2.98*  --   --  2.77*  --   TROPONINIHS  --   --   --   --  8,901* 1,139*  --   --   --    < > = values in this interval not displayed.    Estimated Creatinine Clearance: 21.6 mL/min (A) (by C-G formula based on SCr of 2.77 mg/dL (H)).   Medical History: Past Medical History:  Diagnosis Date   Arthritis    Atypical ductal hyperplasia of right breast    Breast cancer (HCC) 01/25/2021   Family history of ovarian cancer    Family history of pancreatic cancer    Family history of prostate cancer    Family history of stomach cancer    GERD (gastroesophageal reflux disease)    Hyperlipidemia    Hypertension    Personal history of radiation therapy     Medications: No anticoagulants PTA  Assessment: Patient is a 24 yoF admitted for cardiogenic shock. Pharmacy consulted for heparin dosing for NSTEMI. Since patient is in AKI, will hold off on LHC due to potential worsening of AKI with contrast dye. Plan to proceed with cardiac MRI today to evaluate the coronary arteries.   11/08/23 AM update: Heparin level supratherapeutic at 0.87 on heparin 800 units/hr. Level drawn ~10 hours after restart. Hgb stable (8.8)  and PLT WNL (182). No signs of bleeding noted per RN.    Goal of Therapy:  Heparin level 0.3-0.7 units/ml Monitor platelets by anticoagulation protocol: Yes   Plan:  Decrease heparin to 650 units/hr Check 8 hour heparin level Monitor for signs of bleeding, Hgb, PLT Plan to continue heparin for at least 48 hours. Will follow-up duration after cardiac MRI results.   Izetta Carl, PharmD PGY1 Pharmacy Resident

## 2023-11-08 NOTE — Consult Note (Signed)
 Chief Complaint: Patient was seen in consultation today for cardiogenic shock  Referring Physician(s): Dr. Lenny Drought  Supervising Physician: Jennefer Rover  Patient Status: [facility] - In-pt  History of Present Illness: Vickie Collier is a 73 y.o. female w/ PMH essential HTN, breast cancer, HLD who presented to urgent care vs. ED twice in the past week with symptoms of dizziness, leg pain, and shortness of breath.  Ultimately she returned to [facility] ED 11/06/23 with persistent and progressive symptoms.  Her troponins were significantly elevated, her BNP was >35K with EKG changes.  She was admitted for ACS/NSTEMI with concern for RV infarct vs. Failure leading to cardiogenic shock.  She was started on IV heparin.  Ultimately requiring pressors for hypotension with concern for poor perfusion.  He underwent RHC today (LHC deferred due to AKI) with difficulty due to severe PAH with cor pulmonale suggesting acute PE. Again, due to concern for AKI, VQ scan performed demonstrating perfusion defects consistent with pulmonary embolism.  Of note, a LE doppler was performed today showing DVT in the left soleal vine. Exam was limited by body habitus.   IR consulted for lysis procedure.   Case reviewed by Drs. Jenna and Suttle who have discussed with medical team.  Team hesitant to proceed with CTA Chest given her AKI with SCr 2.33.  Dr. Jennefer agrees to proceed with lysis catheter placement if family agreeable given the overall deterioration and progressive shock with little to no improvement on heparin therapy alone.    She is FULL CODE.   Past Medical History:  Diagnosis Date   Arthritis    Atypical ductal hyperplasia of right breast    Breast cancer (HCC) 01/25/2021   Family history of ovarian cancer    Family history of pancreatic cancer    Family history of prostate cancer    Family history of stomach cancer    GERD (gastroesophageal reflux disease)    Hyperlipidemia    Hypertension     Personal history of radiation therapy     Past Surgical History:  Procedure Laterality Date   ABDOMINAL HYSTERECTOMY     BREAST LUMPECTOMY     JOINT REPLACEMENT Bilateral    TKR   RADIOACTIVE SEED GUIDED EXCISIONAL BREAST BIOPSY Right 01/25/2021   Procedure: RADIOACTIVE SEED GUIDED EXCISIONAL RIGHT BREAST BIOPSY;  Surgeon: Aron Shoulders, MD;  Location: [facility];  Service: General;  Laterality: Right;   RE-EXCISION OF BREAST LUMPECTOMY Right 02/14/2021   Procedure: RE-EXCISION OF RIGHT BREAST LUMPECTOMY;  Surgeon: Aron Shoulders, MD;  Location: [facility];  Service: General;  Laterality: Right;    Allergies: Lisinopril  Medications: Prior to Admission medications   Medication Sig Start Date End Date Taking? Authorizing Provider  amLODipine (NORVASC) 10 MG tablet Take 10 mg by mouth daily.   Yes [provider]  baclofen (LIORESAL) 10 MG tablet Take 10 mg by mouth 2 (two) times daily.   Yes [provider]  cyanocobalamin 100 MCG tablet Take 100 mcg by mouth daily.   Yes [provider]  felodipine (PLENDIL) 5 MG 24 hr tablet Take 5 mg by mouth daily.   Yes [provider]  letrozole  (FEMARA ) 2.5 MG tablet Take 1 tablet (2.5 mg total) by mouth daily. 02/19/23  Yes Gudena, Vinay, MD  metoprolol succinate (TOPROL-XL) 50 MG 24 hr tablet Take 50 mg by mouth daily. Take with or immediately following a meal.   Yes [provider]  nebivolol (BYSTOLIC) 10 MG  tablet Take 10 mg by mouth daily. 08/20/23  Yes [provider]  omeprazole (PRILOSEC) 40 MG capsule Take 40 mg by mouth 2 (two) times daily as needed. 05/14/23  Yes [provider]  pregabalin (LYRICA) 150 MG capsule Take 1 capsule (150 mg total) by mouth 2 (two) times daily. 11/03/23  Yes Kammerer, Megan L, DO  rosuvastatin (CRESTOR) 5 MG tablet Take 5 mg by mouth daily.   Yes [provider]  valsartan-hydrochlorothiazide (DIOVAN-HCT)  80-12.5 MG tablet Take 1 tablet by mouth daily.   Yes [provider]  cholecalciferol (VITAMIN D3) 25 MCG (1000 UNIT) tablet Take 1,000 Units by mouth daily.    [provider]  lidocaine  4 % Place 1 patch onto the skin daily. Patient not taking: Reported on 11/06/2023 12/21/22   Neysa Caron PARAS, DO     Family History  Problem Relation Age of Onset   Lupus Father    Ovarian cancer Maternal Grandmother 65   Prostate cancer Half-Brother 109   Pancreatic cancer Cousin        paternal first cousin   Stomach cancer Cousin        paternal first cousin   Lung cancer Cousin    Cancer Cousin        NOS    Social History   Socioeconomic History   Marital status: Single    Spouse name: Not on file   Number of children: Not on file   Years of education: Not on file   Highest education level: Not on file  Occupational History   Not on file  Tobacco Use   Smoking status: Never   Smokeless tobacco: Never  Substance and Sexual Activity   Alcohol use: Not Currently   Drug use: Never   Sexual activity: Not Currently    Birth control/protection: Surgical  Other Topics Concern   Not on file  Social History Narrative   Not on file   Social Drivers of Health   Financial Resource Strain: Low Risk  (02/10/2019)   Received from [facility]   Overall Financial Resource Strain (CARDIA)    Difficulty of Paying Living Expenses: Not hard at all  Food Insecurity: No Food Insecurity (02/10/2019)   Received from [facility]   Hunger Vital Sign    Within the past 12 months, you worried that your food would run out before you got the money to buy more.: Never true    Within the past 12 months, the food you bought just didn't last and you didn't have money to get more.: Never true  Transportation Needs: No Transportation Needs (02/10/2019)   Received from [facility]   PRAPARE - Scientist, research (physical sciences) (Medical): No    Lack of Transportation (Non-Medical): No  Physical Activity: Insufficiently Active (08/28/2017)   Received from [facility]   Exercise Vital Sign    Days of Exercise per Week: 7 days    Minutes of Exercise per Session: 20 min  Stress: No Stress Concern Present (08/28/2017)   Received from [facility]   [employer] of Occupational Health - Occupational Stress Questionnaire    Feeling of Stress : Not at all  Social Connections: Unknown (04/02/2018)   Received from [facility]   Social Connection and Isolation Panel    Frequency of Communication with Friends and Family: Not on file  Frequency of Social Gatherings with Friends and Family: Not on file    Attends Religious Services: Not on file    Active Member of Clubs or Organizations: Not on file    Attends Banker Meetings: Not on file    Are you married, widowed, divorced, separated, never married, or living with a partner?: Divorced     Review of Systems: A 12 point ROS discussed and pertinent positives are indicated in the HPI above.  All other systems are negative.  Review of Systems  Constitutional:  Negative for fatigue and fever.  Respiratory:  Positive for shortness of breath. Negative for cough.     Vital Signs: BP (!) 139/46   Pulse 66   Temp (!) 97.5 F (36.4 C) (Axillary)   Resp 14   Wt 229 lb 0.9 oz (103.9 kg)   SpO2 92%   BMI 38.12 kg/m   Physical Exam Vitals and nursing note reviewed.  Constitutional:      General: She is not in acute distress.    Appearance: Normal appearance. She is ill-appearing.  HENT:     Mouth/Throat:     Mouth: Mucous membranes are moist.  Cardiovascular:     Rate and Rhythm: Normal rate.  Pulmonary:     Comments: No increased work of breathing at rest. RR 17-19 during visit.  Slightly increased shortness of breath with minimal conversation.  Neurological:      General: No focal deficit present.     Mental Status: She is alert and oriented to person, place, and time.      MD Evaluation Airway: WNL Heart: Other (comments) Heart  comments: RV failure Abdomen: WNL Chest/ Lungs: WNL ASA  Classification: 3 Mallampati/Airway Score: Three   Imaging: NM Pulmonary Perfusion Result Date: 11/08/2023 EXAM: NM Lung Perfusion Scan. CLINICAL HISTORY: Heart failure, known or suspected, initial workup; concern for PE. TECHNIQUE: Radiolabeled MAA was administered intravenously and planar images of the lungs were obtained in multiple projections. RADIOPHARMACEUTICAL: 4.4 mCi Tc44m MAA via existing IV @1315  hb. COMPARISON: Chest radiograph 11/07/2023. FINDINGS: PERFUSION: Abnormal perfusion to both lungs with multiple large segmental perfusion defects. This includes a large right upper lobe defect as well as a large defect within the right lung base. Multiple moderate and large-sized perfusion defects are also noted within the upper and lower lobe of the left lung. In the appropriate clinical setting, imaging findings are compatible with pulmonary embolism. IMPRESSION: 1. Multiple large segmental perfusion defects in both lungs, including the right upper lobe and right lung base, as well as the left upper and lower lobes. There are no corresponding pulmonary opacities on the chest radiograph to account for this finding. . 2. Imaging findings are compatible with pulmonary embolism. 3. Critical results were called to the ordering provider at the time of interpretation on 11/08/2023. I personally spoke with Dr. Lenny Drought, who confirmed these findings. Electronically signed by: Waddell Calk MD 11/08/2023 03:49 PM EDT RP Workstation: [id number]   CARDIAC CATHETERIZATION Result Date: 11/08/2023 Pt on NE 6 and milrinone 0.25 Findings: RA = 13 RV = 59/14 PA = 68/30 (44) PCW = 9 Fick cardiac output/index = 7.4/3.5 TD CO/CI = 4.5/2.1 (more accurate) PVR = 9.2 WU Ao sat =  92% PA sat = 58%, 62% PAPi = 3.0 Assessment: 1. Severe PAH with cor pulmonale 2. Normal left-sided filling pressures Plan/Discussion: Suspect PE (unable to wedge catheter on right - suspect due to clot) Continue heparin. Proceed with VQ. Toribio Fuel, MD 3:10  PM  VAS US  LOWER EXTREMITY VENOUS (DVT) Result Date: 11/08/2023  Lower Venous DVT Study Patient Name:  KINETA FUDALA  Date of Exam:   11/08/2023 Medical Rec #: [id number]        Accession #:    [id number] Date of Birth: [birth year]       Patient Gender: F Patient Age:   62 years Exam Location:  [facility] Procedure:      VAS US  LOWER EXTREMITY VENOUS (DVT) Referring Phys: TORIBIO SHARPS --------------------------------------------------------------------------------  Indications: Edema, and weakness, lightheadedness, H/O cancer, knee pain, unable to bear weight.  Limitations: Body habitus, poor ultrasound/tissue interface and Unable to properly position. Comparison Study: No prior exam. Performing Technologist: Edilia Elden Appl  Examination Guidelines: A complete evaluation includes B-mode imaging, spectral Doppler, color Doppler, and power Doppler as needed of all accessible portions of each vessel. Bilateral testing is considered an integral part of a complete examination. Limited examinations for reoccurring indications may be performed as noted. The reflux portion of the exam is performed with the patient in reverse Trendelenburg.  +---------+---------------+---------+-----------+----------+-------------------+ RIGHT    CompressibilityPhasicitySpontaneityPropertiesThrombus Aging      +---------+---------------+---------+-----------+----------+-------------------+ CFV      Full           Yes      Yes                                      +---------+---------------+---------+-----------+----------+-------------------+ SFJ      Full           Yes      Yes                                       +---------+---------------+---------+-----------+----------+-------------------+ FV Prox  Full                                                             +---------+---------------+---------+-----------+----------+-------------------+ FV Mid   Full                                                             +---------+---------------+---------+-----------+----------+-------------------+ FV Distal               No       Yes                  Not well visualized +---------+---------------+---------+-----------+----------+-------------------+ PFV                     Yes      Yes                  Not well visualized +---------+---------------+---------+-----------+----------+-------------------+ POP                     Yes      Yes                  Not well visualized +---------+---------------+---------+-----------+----------+-------------------+ PTV  Full           No       No                                       +---------+---------------+---------+-----------+----------+-------------------+ PERO     Full                                                             +---------+---------------+---------+-----------+----------+-------------------+ Right profunda, distal femoral and popliteal veins are not well visualized due to patient's body habitus, but are patent with color and doppler. Right posterior tibial veins are compressible but unable to fill one of the pair with color.  +---------+---------------+---------+-----------+----------+-------------------+ LEFT     CompressibilityPhasicitySpontaneityPropertiesThrombus Aging      +---------+---------------+---------+-----------+----------+-------------------+ CFV      Full           Yes      Yes                                      +---------+---------------+---------+-----------+----------+-------------------+ SFJ      Full           Yes      Yes                                       +---------+---------------+---------+-----------+----------+-------------------+ FV Prox  Full                                                             +---------+---------------+---------+-----------+----------+-------------------+ FV Mid   Full                                                             +---------+---------------+---------+-----------+----------+-------------------+ FV Distal               Yes      Yes                  Not well visualized +---------+---------------+---------+-----------+----------+-------------------+ PFV                     Yes      Yes                  Not well visualized +---------+---------------+---------+-----------+----------+-------------------+ POP                     Yes      Yes                  Not well visualized +---------+---------------+---------+-----------+----------+-------------------+ PTV      Full                                                             +---------+---------------+---------+-----------+----------+-------------------+  PERO     Full                                                             +---------+---------------+---------+-----------+----------+-------------------+ Soleal   None           No       No                                       +---------+---------------+---------+-----------+----------+-------------------+ Deep vein thrombosis noted in the left soleal vein. Left profunda, distal femoral and popliteal veins are not well visualized due to patient's body habitus, but are patent with color and doppler. Left posterior tibial vein and peroneal veins are compressible but unable to fill with color.   Summary: RIGHT: - There is no evidence of deep vein thrombosis in the lower extremity. However, portions of this examination were limited- see technologist comments above.  - No cystic structure found in the popliteal fossa.  LEFT: Findings consistent with acute intramuscular  thrombosis involving the left soleal veins. - No cystic structure found in the popliteal fossa.  *See table(s) above for measurements and observations.    Preliminary    DG CHEST PORT 1 VIEW Result Date: 11/07/2023 CLINICAL DATA:  Malfunctioning central venous catheter EXAM: PORTABLE CHEST 1 VIEW COMPARISON:  11/06/2023 FINDINGS: Single frontal view of the chest demonstrates left internal jugular central venous catheter tip overlying the atriocaval junction. Cardiac silhouette is enlarged but stable. No acute airspace disease, effusion, or pneumothorax. No acute bony abnormalities. IMPRESSION: 1. Stable enlarged cardiac silhouette. 2. No acute airspace disease. Electronically Signed   By: Ozell Daring M.D.   On: 11/07/2023 16:26   ECHOCARDIOGRAM COMPLETE Result Date: 11/07/2023    ECHOCARDIOGRAM REPORT   Patient Name:   TANISHA LUTES Date of Exam: 11/07/2023 Medical Rec #:  [id number]       Height:       65.0 in Accession #:    [id number]      Weight:       265.0 lb Date of Birth:  09-Nov-1950      BSA:          2.229 m Patient Age:    72 years        BP:           94/70 mmHg Patient Gender: F               HR:           60 bpm. Exam Location:  Inpatient Procedure: 2D Echo, Cardiac Doppler and Color Doppler (Both Spectral and Color            Flow Doppler were utilized during procedure). Indications:    Chest Pain R07.9 , NSTEMI I21.4  History:        Patient has no prior history of Echocardiogram examinations.  Sonographer:    Tinnie Gosling RDCS Referring Phys: [id number] ERIC J [place] IMPRESSIONS  1. Left ventricular ejection fraction, by estimation, is 35 to 40%. The left ventricle has moderately decreased function. The left ventricle demonstrates global hypokinesis. There is mild concentric left ventricular hypertrophy. Left ventricular diastolic parameters are consistent with Grade I diastolic dysfunction (impaired relaxation). There  is the interventricular septum is flattened in systole and diastole,  consistent with right ventricular pressure and volume overload.  2. Right ventricular systolic function is severely reduced. The right ventricular size is moderately enlarged. There is moderately elevated pulmonary artery systolic pressure. The estimated right ventricular systolic pressure is 57.5 mmHg.  3. Right atrial size was severely dilated.  4. The mitral valve is normal in structure. Trivial mitral valve regurgitation. No evidence of mitral stenosis.  5. Tricuspid valve regurgitation is severe.  6. The aortic valve is tricuspid. There is mild calcification of the aortic valve. Aortic valve regurgitation is not visualized. Mild aortic valve stenosis. Aortic valve mean gradient measures 4.0 mmHg. Aortic valve Vmax measures 1.29 m/s.  7. The inferior vena cava is dilated in size with <50% respiratory variability, suggesting right atrial pressure of 15 mmHg. FINDINGS  Left Ventricle: Left ventricular ejection fraction, by estimation, is 35 to 40%. The left ventricle has moderately decreased function. The left ventricle demonstrates global hypokinesis. The left ventricular internal cavity size was normal in size. There is mild concentric left ventricular hypertrophy. The interventricular septum is flattened in systole and diastole, consistent with right ventricular pressure and volume overload. Left ventricular diastolic parameters are consistent with Grade I diastolic dysfunction (impaired relaxation). Right Ventricle: The right ventricular size is moderately enlarged. No increase in right ventricular wall thickness. Right ventricular systolic function is severely reduced. There is moderately elevated pulmonary artery systolic pressure. The tricuspid regurgitant velocity is 3.26 m/s, and with an assumed right atrial pressure of 15 mmHg, the estimated right ventricular systolic pressure is 57.5 mmHg. Left Atrium: Left atrial size was normal in size. Right Atrium: Right atrial size was severely dilated.  Pericardium: There is no evidence of pericardial effusion. Mitral Valve: The mitral valve is normal in structure. Mild mitral annular calcification. Trivial mitral valve regurgitation. No evidence of mitral valve stenosis. Tricuspid Valve: The tricuspid valve is normal in structure. Tricuspid valve regurgitation is severe. No evidence of tricuspid stenosis. Aortic Valve: The aortic valve is tricuspid. There is mild calcification of the aortic valve. Aortic valve regurgitation is not visualized. Mild aortic stenosis is present. Aortic valve mean gradient measures 4.0 mmHg. Aortic valve peak gradient measures  6.7 mmHg. Aortic valve area, by VTI measures 1.41 cm. Pulmonic Valve: The pulmonic valve was normal in structure. Pulmonic valve regurgitation is trivial. No evidence of pulmonic stenosis. Aorta: The aortic root is normal in size and structure. Venous: The inferior vena cava is dilated in size with less than 50% respiratory variability, suggesting right atrial pressure of 15 mmHg. IAS/Shunts: There is left bowing of the interatrial septum, suggestive of elevated right atrial pressure. No atrial level shunt detected by color flow Doppler.  LEFT VENTRICLE PLAX 2D LVIDd:         3.90 cm   Diastology LVIDs:         3.20 cm   LV e' medial:    4.13 cm/s LV PW:         1.10 cm   LV E/e' medial:  9.4 LV IVS:        1.00 cm   LV e' lateral:   3.70 cm/s LVOT diam:     2.00 cm   LV E/e' lateral: 10.5 LV SV:         31 LV SV Index:   14 LVOT Area:     3.14 cm  RIGHT VENTRICLE            IVC  RV S prime:     9.25 cm/s  IVC diam: 2.30 cm TAPSE (M-mode): 0.8 cm LEFT ATRIUM           Index        RIGHT ATRIUM           Index LA diam:      3.00 cm 1.35 cm/m   RA Area:     32.10 cm LA Vol (A4C): 32.0 ml 14.35 ml/m  RA Volume:   144.00 ml 64.59 ml/m  AORTIC VALVE AV Area (Vmax):    1.67 cm AV Area (Vmean):   1.62 cm AV Area (VTI):     1.41 cm AV Vmax:           129.00 cm/s AV Vmean:          88.500 cm/s AV VTI:             0.223 m AV Peak Grad:      6.7 mmHg AV Mean Grad:      4.0 mmHg LVOT Vmax:         68.50 cm/s LVOT Vmean:        45.500 cm/s LVOT VTI:          0.100 m LVOT/AV VTI ratio: 0.45  AORTA Ao Root diam: 2.70 cm Ao Asc diam:  3.50 cm MITRAL VALVE               TRICUSPID VALVE MV Area (PHT): 2.11 cm    TR Peak grad:   42.5 mmHg MV Decel Time: 359 msec    TR Mean grad:   29.0 mmHg MV E velocity: 39.00 cm/s  TR Vmax:        326.00 cm/s MV A velocity: 48.00 cm/s  TR Vmean:       254.0 cm/s MV E/A ratio:  0.81                            SHUNTS                            Systemic VTI:  0.10 m                            Systemic Diam: 2.00 cm Toribio Fuel MD Electronically signed by Toribio Fuel MD Signature Date/Time: 11/07/2023/12:17:10 PM    Final    DG Chest Port 1 View Result Date: 11/06/2023 CLINICAL DATA:  Shortness of breath. EXAM: PORTABLE CHEST 1 VIEW COMPARISON:  Chest radiograph dated 11/02/2023 FINDINGS: No focal consolidation, pleural effusion or pneumothorax. Stable cardiomegaly. No acute osseous pathology. Degenerative changes of the spine and shoulders. IMPRESSION: 1. No active disease. 2. Cardiomegaly. Electronically Signed   By: Vanetta Chou M.D.   On: 11/06/2023 17:53   DG Chest Port 1 View Result Date: 11/02/2023 EXAM: 1 VIEW(S) XRAY OF THE CHEST 11/02/2023 11:29:00 PM COMPARISON: 12/21/2022 CLINICAL HISTORY: sob. Pt c/o dizziness since one day last week with occasional SOB. Pt reports hx HTN, compliant with medications. FINDINGS: LUNGS AND PLEURA: No focal pulmonary opacity. No pulmonary edema. No pleural effusion. No pneumothorax. HEART AND MEDIASTINUM: Cardiomegaly. No acute abnormality of the mediastinal silhouette. BONES AND SOFT TISSUES: Surgical clips in RIGHT breast. No acute osseous abnormality. JOINTS: Severe degenerative changes of LEFT shoulder. Mild degenerative changes of RIGHT shoulder. IMPRESSION: 1. No acute cardiopulmonary abnormality. 2. Cardiomegaly. Electronically  signed by: Norman Gatlin MD 11/02/2023 11:45 PM EDT RP Workstation: [id number]    Labs:  CBC: Recent Labs    11/02/23 1843 11/06/23 1733 11/07/23 0546 11/08/23 0445 11/08/23 1456  WBC 6.1 7.9 7.4 7.5  --   HGB 10.8* 10.8* 9.1* 8.8* 9.9*  9.9*  HCT 34.3* 36.1 30.9* 28.7* 29.0*  29.0*  PLT 254 222 179 182  --     COAGS: No results for input(s): INR, APTT in the last 8760 hours.  BMP: Recent Labs    11/06/23 1733 11/07/23 0546 11/07/23 1508 11/08/23 0445 11/08/23 1456  NA 144 146* 143 139 141  142  K 4.3 4.1 4.8 3.8 4.5  4.4  CL 107 110 109 103  --   CO2 24 22 19* 22  --   GLUCOSE 122* 96 93 106*  --   BUN 26* 29* 32* 36*  --   CALCIUM 9.4 8.6* 8.8* 8.6*  --   CREATININE 2.06* 2.36* 2.98* 2.77*  --   GFRNONAA 25* 21* 16* 18*  --     LIVER FUNCTION TESTS: Recent Labs    11/02/23 1843 11/06/23 1733 11/07/23 1508 11/08/23 0445  BILITOT 1.1 0.5 0.6 0.9  AST 21 47* 66* 60*  ALT 6 6 15 17   ALKPHOS 71 65 48 47  PROT 8.9* 7.8 7.5 6.8  ALBUMIN 4.2 3.7 3.2* 2.9*    TUMOR MARKERS: No results for input(s): AFPTM, CEA, CA199, CHROMGRNA in the last 8760 hours.  Assessment and Plan: Pulmonary embolus   Patient with several day history of shortness of breath, lightheadness, functional decline admitted with cardiogenic shock thought to be due to RV infarct vs. Failure.  VQ scan performed today after RHC demonstrated cor pulmonale and consistent with pulmonary embolus.  Per CCM/cardiology patient not a candidate for systemic tPA. IR consulted for catheter directed therapy.   Request reviewed by Drs. Jenna and Suttle who recommended CTA Chest to determine clot location and burden to best direct therapy-- catheter placement and/or thrombectomy.  However, overall hesitance from team and family regarding her current acute kidney injury with elevated SCr above baseline. Would like to avoid CTA/significant contrast use with angiogram.    Clinically, patient  has stalled on IV heparin alone.  She has been able to wean from levophed this afternoon, but remains on milrinone for inotropic support. She is currently on 3 L .   Troponin 10/23 8860  Patient at least Intermediate risk based on PESI score.   After discussion with family at bedside, decision made to proceed with catheter-directed therapy.  They are aware of the risks, goals, and benefits.   Prior to procedure: -Place foley catheter placement.  -Obtain CT Head due to history of breast cancer.   Risks and benefits discussed with the patient and her family including, but not limited to bleeding, possible life threatening bleeding and need for blood product transfusion, vascular injury, stroke, contrast induced renal failure, limb loss and infection.  All of the family and patient's questions were answered, patient is agreeable to proceed and provides verbal consent.  Family, son, signs as POA. Consent signed and in chart.   Advance Care Plan: The advanced care plan/surrogate decision maker was discussed at the time of visit and documented in the medical record.     Thank you for this interesting consult.  I greatly enjoyed meeting Vickie Collier and look forward to participating in their care.  A copy of this report was sent to the requesting  provider on this date.  Electronically Signed: Emaya Preston Sue-Ellen Adolpho Meenach, PA 11/08/2023, 4:55 PM   I spent a total of 55 Miinutes    in face to face in clinical consultation, greater than 50% of which was counseling/coordinating care for pulmonary embolus.

## 2023-11-08 NOTE — Interval H&P Note (Signed)
 History and Physical Interval Note:  11/08/2023 2:43 PM  Vickie Collier  has presented today for surgery, with the diagnosis of heart failure.  The various methods of treatment have been discussed with the patient and family. After consideration of risks, benefits and other options for treatment, the patient has consented to  Procedure(s): RIGHT HEART CATH (N/A) as a surgical intervention.  The patient's history has been reviewed, patient examined, no change in status, stable for surgery.  I have reviewed the patient's chart and labs.  Questions were answered to the patient's satisfaction.     Puja Caffey

## 2023-11-08 NOTE — Progress Notes (Signed)
 eLink Physician-Brief Progress Note Patient Name: Vickie Collier DOB: 1950-12-20 MRN: [id number]   Date of Service  11/08/2023  HPI/Events of Note  Status post bilateral femoral cath placement for catheter directed lysis.  Requesting to advance diet  eICU Interventions  Resume heart healthy diet, IR to reassess at 8 AM-Will make n.p.o. after midnight in the event of potential procedure     Intervention Category Minor Interventions: Routine modifications to care plan (e.g. PRN medications for pain, fever)  Hayden Mabin 11/08/2023, 9:28 PM

## 2023-11-08 NOTE — Procedures (Signed)
 Interventional Radiology Procedure Note  Procedure:  Initiation of bilateral catheter directed pulmonary artery thrombolysis  Findings: Please refer to procedural dictation for full description. 2, 6 Fr sheaths via right internal jugular with indwelling bilateral pulmonary artery infusion catheters.  Complications: None immediate  Estimated Blood Loss: < 5 mL  Recommendations: 0.5 mg t-PA/hr via each infusion catheter for 12 hours.  Fibrinogen Q6H after procedure. Continue heparin gtt. IR will follow and plan to measure pulmonary artery pressures ~8 am tomorrow.   Ester Sides, MD

## 2023-11-09 ENCOUNTER — Inpatient Hospital Stay (HOSPITAL_COMMUNITY)

## 2023-11-09 DIAGNOSIS — I4891 Unspecified atrial fibrillation: Secondary | ICD-10-CM

## 2023-11-09 DIAGNOSIS — I5082 Biventricular heart failure: Secondary | ICD-10-CM | POA: Diagnosis not present

## 2023-11-09 DIAGNOSIS — I214 Non-ST elevation (NSTEMI) myocardial infarction: Secondary | ICD-10-CM | POA: Diagnosis not present

## 2023-11-09 HISTORY — PX: IR THROMB F/U EVAL ART/VEN FINAL DAY (MS): IMG5379

## 2023-11-09 LAB — ECHOCARDIOGRAM COMPLETE
AR max vel: 1.7 cm2
AV Area VTI: 1.76 cm2
AV Area mean vel: 1.6 cm2
AV Mean grad: 11.5 mmHg
AV Peak grad: 22.5 mmHg
Ao pk vel: 2.37 m/s
Area-P 1/2: 3.37 cm2
S' Lateral: 2.5 cm
Weight: 3809.55 [oz_av]

## 2023-11-09 LAB — HEPARIN LEVEL (UNFRACTIONATED)
Heparin Unfractionated: 0.32 [IU]/mL (ref 0.30–0.70)
Heparin Unfractionated: 0.34 [IU]/mL (ref 0.30–0.70)
Heparin Unfractionated: 0.38 [IU]/mL (ref 0.30–0.70)
Heparin Unfractionated: 0.44 [IU]/mL (ref 0.30–0.70)

## 2023-11-09 LAB — COOXEMETRY PANEL
Carboxyhemoglobin: 1.8 % — ABNORMAL HIGH (ref 0.5–1.5)
Carboxyhemoglobin: 1.6 % — ABNORMAL HIGH (ref 0.5–1.5)
Methemoglobin: 0.9 % (ref 0.0–1.5)
Methemoglobin: 1 % (ref 0.0–1.5)
O2 Saturation: 63.6 %
O2 Saturation: 70.3 %
Total hemoglobin: 8.9 g/dL — ABNORMAL LOW (ref 12.0–16.0)
Total hemoglobin: 8.8 g/dL — ABNORMAL LOW (ref 12.0–16.0)

## 2023-11-09 LAB — COMPREHENSIVE METABOLIC PANEL WITH GFR
ALT: 18 U/L (ref 0–44)
AST: 49 U/L — ABNORMAL HIGH (ref 15–41)
Albumin: 2.8 g/dL — ABNORMAL LOW (ref 3.5–5.0)
Alkaline Phosphatase: 45 U/L (ref 38–126)
Anion gap: 11 (ref 5–15)
BUN: 36 mg/dL — ABNORMAL HIGH (ref 8–23)
CO2: 22 mmol/L (ref 22–32)
Calcium: 8.5 mg/dL — ABNORMAL LOW (ref 8.9–10.3)
Chloride: 104 mmol/L (ref 98–111)
Creatinine, Ser: 2.22 mg/dL — ABNORMAL HIGH (ref 0.44–1.00)
GFR, Estimated: 23 mL/min — ABNORMAL LOW (ref >60)
Glucose, Bld: 111 mg/dL — ABNORMAL HIGH (ref 70–99)
Potassium: 4 mmol/L (ref 3.5–5.1)
Sodium: 137 mmol/L (ref 135–145)
Total Bilirubin: 0.9 mg/dL (ref 0.0–1.2)
Total Protein: 6.6 g/dL (ref 6.5–8.1)

## 2023-11-09 LAB — CBC
HCT: 27.3 % — ABNORMAL LOW (ref 36.0–46.0)
HCT: 27.5 % — ABNORMAL LOW (ref 36.0–46.0)
HCT: 27.7 % — ABNORMAL LOW (ref 36.0–46.0)
HCT: 28.5 % — ABNORMAL LOW (ref 36.0–46.0)
Hemoglobin: 8.4 g/dL — ABNORMAL LOW (ref 12.0–15.0)
Hemoglobin: 8.4 g/dL — ABNORMAL LOW (ref 12.0–15.0)
Hemoglobin: 8.6 g/dL — ABNORMAL LOW (ref 12.0–15.0)
Hemoglobin: 8.8 g/dL — ABNORMAL LOW (ref 12.0–15.0)
MCH: 25.8 pg — ABNORMAL LOW (ref 26.0–34.0)
MCH: 26 pg (ref 26.0–34.0)
MCH: 26.1 pg (ref 26.0–34.0)
MCH: 26.3 pg (ref 26.0–34.0)
MCHC: 30.5 g/dL (ref 30.0–36.0)
MCHC: 30.8 g/dL (ref 30.0–36.0)
MCHC: 30.9 g/dL (ref 30.0–36.0)
MCHC: 31 g/dL (ref 30.0–36.0)
MCV: 84.5 fL (ref 80.0–100.0)
MCV: 84.6 fL (ref 80.0–100.0)
MCV: 84.6 fL (ref 80.0–100.0)
MCV: 84.7 fL (ref 80.0–100.0)
Platelets: 148 K/uL — ABNORMAL LOW (ref 150–400)
Platelets: 150 K/uL (ref 150–400)
Platelets: 157 K/uL (ref 150–400)
Platelets: 160 K/uL (ref 150–400)
RBC: 3.23 MIL/uL — ABNORMAL LOW (ref 3.87–5.11)
RBC: 3.25 MIL/uL — ABNORMAL LOW (ref 3.87–5.11)
RBC: 3.27 MIL/uL — ABNORMAL LOW (ref 3.87–5.11)
RBC: 3.37 MIL/uL — ABNORMAL LOW (ref 3.87–5.11)
RDW: 17.9 % — ABNORMAL HIGH (ref 11.5–15.5)
RDW: 18 % — ABNORMAL HIGH (ref 11.5–15.5)
RDW: 18.1 % — ABNORMAL HIGH (ref 11.5–15.5)
RDW: 18.1 % — ABNORMAL HIGH (ref 11.5–15.5)
WBC: 6.2 K/uL (ref 4.0–10.5)
WBC: 6.3 K/uL (ref 4.0–10.5)
WBC: 6.5 K/uL (ref 4.0–10.5)
WBC: 6.6 K/uL (ref 4.0–10.5)
nRBC: 0.3 % — ABNORMAL HIGH (ref 0.0–0.2)
nRBC: 0.5 % — ABNORMAL HIGH (ref 0.0–0.2)
nRBC: 0.6 % — ABNORMAL HIGH (ref 0.0–0.2)
nRBC: 0.6 % — ABNORMAL HIGH (ref 0.0–0.2)

## 2023-11-09 LAB — MAGNESIUM: Magnesium: 1.7 mg/dL (ref 1.7–2.4)

## 2023-11-09 LAB — LACTIC ACID, PLASMA: Lactic Acid, Venous: 1 mmol/L (ref 0.5–1.9)

## 2023-11-09 LAB — FIBRINOGEN
Fibrinogen: 427 mg/dL (ref 210–475)
Fibrinogen: 434 mg/dL (ref 210–475)
Fibrinogen: 435 mg/dL (ref 210–475)
Fibrinogen: 516 mg/dL — ABNORMAL HIGH (ref 210–475)

## 2023-11-09 MED ORDER — HYDROMORPHONE HCL 1 MG/ML IJ SOLN
0.5000 mg | Freq: Four times a day (QID) | INTRAMUSCULAR | Status: DC | PRN
  Administered 2023-11-09: 0.5 mg via INTRAVENOUS
  Filled 2023-11-09: qty 0.5

## 2023-11-09 MED ORDER — ACETAMINOPHEN 325 MG PO TABS
650.0000 mg | ORAL_TABLET | Freq: Four times a day (QID) | ORAL | Status: DC | PRN
  Administered 2023-11-09 – 2023-11-11 (×3): 650 mg via ORAL
  Filled 2023-11-09 (×4): qty 2

## 2023-11-09 MED ORDER — OXYCODONE-ACETAMINOPHEN 5-325 MG PO TABS
1.0000 | ORAL_TABLET | Freq: Four times a day (QID) | ORAL | Status: DC | PRN
  Administered 2023-11-10 – 2023-11-15 (×8): 1 via ORAL
  Filled 2023-11-09 (×8): qty 1

## 2023-11-09 MED ORDER — MAGNESIUM SULFATE 4 GM/100ML IV SOLN
4.0000 g | Freq: Once | INTRAVENOUS | Status: AC
  Administered 2023-11-09: 4 g via INTRAVENOUS
  Filled 2023-11-09: qty 100

## 2023-11-09 NOTE — Progress Notes (Addendum)
 PHARMACY - ANTICOAGULATION CONSULT NOTE  Pharmacy Consult for heparin Indication: PE  Allergies  Allergen Reactions   Lisinopril Cough    Patient Measurements: Weight: 103.9 kg (229 lb 0.9 oz) Heparin dosing weight: 86 kg  Vital Signs: Temp: 98.1 F (36.7 C) (10/24 2000) Temp Source: Oral (10/24 2000) BP: 102/50 (10/24 2200) Pulse Rate: 67 (10/24 2200)  Labs: Recent Labs    11/06/23 1733 11/07/23 0545 11/07/23 0546 11/07/23 0750 11/07/23 1508 11/07/23 1647 11/07/23 2028 11/08/23 0445 11/08/23 0930 11/08/23 1456 11/08/23 2346  HGB 10.8*  --  9.1*  --   --   --   --  8.8*  --  9.9*  9.9*  --   HCT 36.1  --  30.9*  --   --   --   --  28.7*  --  29.0*  29.0*  --   PLT 222  --  179  --   --   --   --  182  --   --   --   HEPARINUNFRC  --    < >  --    < >  --   --  >1.10*  --  0.87*  --  0.44  CREATININE 2.06*  --  2.36*  --  2.98*  --   --  2.77*  --   --   --   TROPONINIHS  --   --   --   --  8,901* 1,139*  --   --   --   --   --    < > = values in this interval not displayed.    Estimated Creatinine Clearance: 21.6 mL/min (A) (by C-G formula based on SCr of 2.77 mg/dL (H)).   Medical History: Past Medical History:  Diagnosis Date   Arthritis    Atypical ductal hyperplasia of right breast    Breast cancer (HCC) 01/25/2021   Family history of ovarian cancer    Family history of pancreatic cancer    Family history of prostate cancer    Family history of stomach cancer    GERD (gastroesophageal reflux disease)    Hyperlipidemia    Hypertension    Personal history of radiation therapy     Medications: No anticoagulants PTA  Assessment: Patient is a 51 yoF admitted for cardiogenic shock. Pharmacy consulted for heparin dosing for NSTEMI. Since patient is in AKI, will hold off on LHC due to potential worsening of AKI with contrast dye. Plan to proceed with cardiac MRI today to evaluate the coronary arteries. Heparin resumed s/p RHC with concerns for PE. Pt  taken to IR for bilateral thrombolysis, heparin continued.  AM: heparin level within goal on 650 units/hr + additional heparin infusing bilaterally. Per RN, no signs/symptoms of bleeding or issues with the heparin gtt running continuously. Fibrinogen WNL, Hgb 8.6, plts 160    Goal of Therapy:  Heparin level 0.3-0.7 units/ml Monitor platelets by anticoagulation protocol: Yes   Plan:  -Continue heparin 650 units/h -Alteplase 0.5mg /h + 0.5mg /h bilaterally -Heparin 2 units/ml infusing bilaterally as well for catheter maintenance (40 units heparin total) -Check q6h heparin level, CBC, fibrinogen  Lynwood Poplar, PharmD, BCPS Clinical Pharmacist 11/09/2023 12:10 AM   ADDENDUM Follow up heparin level within goal on 650 units/hr + additional heparin infusing bilaterally. Per RN, no signs/symptoms of bleeding or issues with the heparin gtt running continuously. Fibrinogen WNL, Hgb low, stable in 8s, plts slowly drifting down.  Goal of Therapy:  Heparin level 0.3-0.7 units/ml  Monitor platelets by anticoagulation protocol: Yes   Plan:  -Continue heparin 650 units/h -Alteplase 0.5mg /h + 0.5mg /h bilaterally -Heparin 2 units/ml infusing bilaterally as well for catheter maintenance (40 units heparin total) -Check q6h heparin level, CBC, fibrinogen  Lynwood Poplar, PharmD, BCPS Clinical Pharmacist 11/09/2023 6:15 AM

## 2023-11-09 NOTE — Progress Notes (Signed)
 Entered room @ [phone], heart rate was at 80. Checked pressures @ 0813 - 36/23 (31)  Removed the sheaths and catheters @ 0816 and achieved hemostases at the site @ 0820.  Placed a gauze an Tegaderm at the site.

## 2023-11-09 NOTE — Progress Notes (Signed)
 Echocardiogram 2D Echocardiogram has been performed.  Vickie Collier 11/09/2023, 11:44 AM

## 2023-11-09 NOTE — Progress Notes (Signed)
 PHARMACY - ANTICOAGULATION CONSULT NOTE  Pharmacy Consult for heparin Indication: PE  Allergies  Allergen Reactions   Lisinopril Cough    Patient Measurements: Weight: 108 kg (238 lb 1.6 oz) Heparin dosing weight: 86 kg  Vital Signs: Temp: 98 F (36.7 C) (10/25 1540) Temp Source: Oral (10/25 1540) BP: 139/80 (10/25 1703) Pulse Rate: 79 (10/25 1703)  Labs: Recent Labs    11/07/23 1508 11/07/23 1647 11/07/23 2028 11/08/23 0445 11/08/23 0930 11/09/23 0549 11/09/23 1055 11/09/23 1704  HGB  --   --   --  8.8*   < > 8.4* 8.4* 8.8*  HCT  --   --   --  28.7*   < > 27.3* 27.5* 28.5*  PLT  --   --   --  182   < > 148* 150 157  HEPARINUNFRC  --   --    < >  --    < > 0.38 0.34 0.32  CREATININE 2.98*  --   --  2.77*  --  2.22*  --   --   TROPONINIHS 8,901* 1,139*  --   --   --   --   --   --    < > = values in this interval not displayed.    Estimated Creatinine Clearance: 27.6 mL/min (A) (by C-G formula based on SCr of 2.22 mg/dL (H)).   Medical History: Past Medical History:  Diagnosis Date   Arthritis    Atypical ductal hyperplasia of right breast    Breast cancer (HCC) 01/25/2021   Family history of ovarian cancer    Family history of pancreatic cancer    Family history of prostate cancer    Family history of stomach cancer    GERD (gastroesophageal reflux disease)    Hyperlipidemia    Hypertension    Personal history of radiation therapy     Medications: No anticoagulants PTA  Assessment: 49 yoF admitted with cardiogenic shock found to have PE. Pharmacy dosing IV heparin. Pt s/p bilateral catheter directed lysis 10/24.  Heparin level remains therapeutic, CBC and fibrinogen ok.  Goal of Therapy:  Heparin level 0.3-0.7 units/ml Monitor platelets by anticoagulation protocol: Yes   Plan:  -Continue heparin 700 units/h -Daily heparin level and CBC  Ozell Jamaica, PharmD, BCPS, [facility] Clinical Pharmacist [phone] Please check AMION for all [facility]  numbers 11/09/2023

## 2023-11-09 NOTE — Progress Notes (Signed)
 PHARMACY - ANTICOAGULATION CONSULT NOTE  Pharmacy Consult for heparin Indication: PE  Allergies  Allergen Reactions   Lisinopril Cough    Patient Measurements: Weight: 108 kg (238 lb 1.6 oz) Heparin dosing weight: 86 kg  Vital Signs: Temp: 97.2 F (36.2 C) (10/25 0400) Temp Source: Oral (10/25 0400) BP: 131/82 (10/25 1130) Pulse Rate: 77 (10/25 1130)  Labs: Recent Labs    11/07/23 1508 11/07/23 1647 11/07/23 2028 11/08/23 0445 11/08/23 0930 11/08/23 2346 11/09/23 0549 11/09/23 1055  HGB  --   --   --  8.8*   < > 8.6* 8.4* 8.4*  HCT  --   --   --  28.7*   < > 27.7* 27.3* 27.5*  PLT  --   --   --  182  --  160 148* 150  HEPARINUNFRC  --   --    < >  --    < > 0.44 0.38 0.34  CREATININE 2.98*  --   --  2.77*  --   --  2.22*  --   TROPONINIHS 8,901* 8,860*  --   --   --   --   --   --    < > = values in this interval not displayed.    Estimated Creatinine Clearance: 27.6 mL/min (A) (by C-G formula based on SCr of 2.22 mg/dL (H)).   Medical History: Past Medical History:  Diagnosis Date   Arthritis    Atypical ductal hyperplasia of right breast    Breast cancer (HCC) 01/25/2021   Family history of ovarian cancer    Family history of pancreatic cancer    Family history of prostate cancer    Family history of stomach cancer    GERD (gastroesophageal reflux disease)    Hyperlipidemia    Hypertension    Personal history of radiation therapy     Medications: No anticoagulants PTA  Assessment: Patient is a 1 yoF admitted for cardiogenic shock. Pharmacy consulted for heparin dosing for NSTEMI. Since patient is in AKI, will hold off on LHC due to potential worsening of AKI with contrast dye. Plan to proceed with cardiac MRI today to evaluate the coronary arteries. Heparin resumed s/p RHC with concerns for PE. Pt taken to IR for bilateral thrombolysis, heparin continued.   11/09/23 1200: Heparin level 0.34, therapeutic at heparin 650 units/hr. Alteplase and heparin  in catheter (40 units total) were discontinued at ~0800. No issues with infusion running or signs of bleeding per RN. CBC stable (Hgb 8.4, PLT 150). Fibrinogen 427 (WNL). Will empirically increase heparin since at lower end of goal range and heparin through catheter was discontinued.   Goal of Therapy:  Heparin level 0.3-0.7 units/ml Monitor platelets by anticoagulation protocol: Yes   Plan:  -Increase heparin to 700 units/h -Alteplase and heparin in catheter discontinued -Check q6h heparin level, CBC, fibrinogen  Reinhard Schack, PharmD PGY2 Cardiology Pharmacy Resident 11/09/2023 12:57 PM

## 2023-11-09 NOTE — Progress Notes (Signed)
 Referring Physician(s): Dr. Lenny Drought  Supervising Physician: Jennefer Rover  Patient Status:  [facility] - In-pt  Chief Complaint: Pulmonary embolus  Subjective: Lysis infusion overnight.  Tolerated well. Received full intended dose.  No bleeding noted. Mentating well this AM.  Patient resting in bed.  Remain on 7L [place] this after dose of dilaudid for pain.  Reports subjective improvement in breathing, work of breathing, and ability to converse.  Sheaths removed by techs this AM without issue.  Allergies: Lisinopril  Medications: Prior to Admission medications   Medication Sig Start Date End Date Taking? Authorizing Provider  amLODipine (NORVASC) 10 MG tablet Take 10 mg by mouth daily.   Yes [provider]  baclofen (LIORESAL) 10 MG tablet Take 10 mg by mouth 2 (two) times daily.   Yes [provider]  cyanocobalamin 100 MCG tablet Take 100 mcg by mouth daily.   Yes [provider]  felodipine (PLENDIL) 5 MG 24 hr tablet Take 5 mg by mouth daily.   Yes [provider]  letrozole  (FEMARA ) 2.5 MG tablet Take 1 tablet (2.5 mg total) by mouth daily. 02/19/23  Yes Gudena, Vinay, MD  metoprolol succinate (TOPROL-XL) 50 MG 24 hr tablet Take 50 mg by mouth daily. Take with or immediately following a meal.   Yes [provider]  nebivolol (BYSTOLIC) 10 MG tablet Take 10 mg by mouth daily. 08/20/23  Yes [provider]  omeprazole (PRILOSEC) 40 MG capsule Take 40 mg by mouth 2 (two) times daily as needed. 05/14/23  Yes [provider]  pregabalin (LYRICA) 150 MG capsule Take 1 capsule (150 mg total) by mouth 2 (two) times daily. 11/03/23  Yes Kammerer, Megan L, DO  rosuvastatin (CRESTOR) 5 MG tablet Take 5 mg by mouth daily.   Yes [provider]  valsartan-hydrochlorothiazide (DIOVAN-HCT) 80-12.5 MG tablet Take 1 tablet by mouth daily.   Yes [provider]  cholecalciferol (VITAMIN D3) 25 MCG (1000 UNIT)  tablet Take 1,000 Units by mouth daily.    [provider]  lidocaine  4 % Place 1 patch onto the skin daily. Patient not taking: Reported on 11/06/2023 12/21/22   Neysa Caron PARAS, DO     Vital Signs: BP (!) 155/125   Pulse 75   Temp (!) 97.2 F (36.2 C) (Oral)   Resp 12   Wt 238 lb 1.6 oz (108 kg)   SpO2 92%   BMI 39.62 kg/m   Physical Exam Vitals and nursing note reviewed.   Chest: clear to auscultation, equal chest rise and fall Skin: R internal jugular puncture sites intact.  Sheaths removed.   Imaging: IR INFUSION THROMBOL ARTERIAL INITIAL (MS) Result Date: 11/09/2023 INDICATION: 73 year old female with history of high risk pulmonary embolism with contraindication to iodinated contrast. EXAM: 1. Ultrasound-guided vascular access of the right internal jugular vein x2. 2. Selective catheterization of the bilateral pulmonary arteries. 3. Initiation of bilateral pulmonary arterial thrombolysis. COMPARISON:  11/08/2023 MEDICATIONS: None. ANESTHESIA/SEDATION: Moderate (conscious) sedation was employed during this procedure. A total of Versed  1 mg and Fentanyl  50 mcg was administered intravenously. Moderate Sedation Time: 28 minutes. The patient's level of consciousness and vital signs were monitored continuously by radiology nursing throughout the procedure under my direct supervision. FLUOROSCOPY TIME:  Twenty mGy reference air kerma CONTRAST:  None. COMPLICATIONS: None immediate. TECHNIQUE: Informed written consent was obtained from the patient after a thorough discussion of the procedural risks, benefits and alternatives. All questions were addressed. Maximal Sterile Barrier Technique was  utilized including caps, mask, sterile gowns, sterile gloves, sterile drape, hand hygiene and skin antiseptic. A timeout was performed prior to the initiation of the procedure. Preprocedure ultrasound evaluation of the right internal jugular vein demonstrated patency. The procedure was planned. At  approximately 1 cm apart, 2 separate sites were planned for venous access. Subdermal Local anesthesia was administered at each planned access site. Small skin next from 8. Under direct ultrasound visualization, 2 separate micropuncture sticks were performed and images were saved sonographically in the permanent record. Micropuncture sheath were placed. Through the more central micropuncture, a Wholey wire was inserted over which the micropuncture sheath was exchanged for a 6 French, 10 cm vascular sheath. Over the wire, a 5 French C2 catheter was directed under fluoroscopic visualization to the right inferior lobar pulmonary artery. The catheter was exchanged for a 5 French, 90 cm, 10 cm infusion length UniFuse catheter. Pulmonary manometry was performed. Next, over the more peripheral IJ access site, the micropuncture sheath was exchanged over a Wholey wire for a 6 French, 10 cm vascular sheath. A 5 French C2 catheter was inserted in used to select the left inferior lobar pulmonary artery. The catheter was exchanged for a 5 French, 90 cm, 10 cm infusion length UniFuse catheter. The catheters and sheath were affixed to the skin with interrupted 0 silk sutures and bandages. Thrombolysis was initiated. The patient tolerated the procedure well was transferred back to the ICU in stable condition. FINDINGS: Right pulmonary artery pressure measured 61/29, mean 41 mmHg. Thrombolysis was initiated 0.5 mg tPA per hour via each catheter. IMPRESSION: Technically successful fluoroscopic guided initiation of bilateral pulmonary artery thrombolysis. Ester Sides, MD [facility] Radiology Electronically Signed   By: Ester Sides M.D.   On: 11/09/2023 05:25   IR INFUSION THROMBOL ARTERIAL INITIAL (MS) Result Date: 11/09/2023 INDICATION: 73 year old female with history of high risk pulmonary embolism with contraindication to iodinated contrast. EXAM: 1. Ultrasound-guided vascular  access of the right internal jugular vein x2. 2. Selective catheterization of the bilateral pulmonary arteries. 3. Initiation of bilateral pulmonary arterial thrombolysis. COMPARISON:  11/08/2023 MEDICATIONS: None. ANESTHESIA/SEDATION: Moderate (conscious) sedation was employed during this procedure. A total of Versed  1 mg and Fentanyl  50 mcg was administered intravenously. Moderate Sedation Time: 28 minutes. The patient's level of consciousness and vital signs were monitored continuously by radiology nursing throughout the procedure under my direct supervision. FLUOROSCOPY TIME:  Twenty mGy reference air kerma CONTRAST:  None. COMPLICATIONS: None immediate. TECHNIQUE: Informed written consent was obtained from the patient after a thorough discussion of the procedural risks, benefits and alternatives. All questions were addressed. Maximal Sterile Barrier Technique was utilized including caps, mask, sterile gowns, sterile gloves, sterile drape, hand hygiene and skin antiseptic. A timeout was performed prior to the initiation of the procedure. Preprocedure ultrasound evaluation of the right internal jugular vein demonstrated patency. The procedure was planned. At approximately 1 cm apart, 2 separate sites were planned for venous access. Subdermal Local anesthesia was administered at each planned access site. Small skin next from 8. Under direct ultrasound visualization, 2 separate micropuncture sticks were performed and images were saved sonographically in the permanent record. Micropuncture sheath were placed. Through the more central micropuncture, a Wholey wire was inserted over which the micropuncture sheath was exchanged for a 6 French, 10 cm vascular sheath. Over the wire, a 5 French C2 catheter was directed under fluoroscopic visualization to the right inferior lobar pulmonary artery. The catheter was exchanged for a 5  French, 90 cm, 10 cm infusion length UniFuse catheter. Pulmonary manometry was performed.  Next, over the more peripheral IJ access site, the micropuncture sheath was exchanged over a Wholey wire for a 6 French, 10 cm vascular sheath. A 5 French C2 catheter was inserted in used to select the left inferior lobar pulmonary artery. The catheter was exchanged for a 5 French, 90 cm, 10 cm infusion length UniFuse catheter. The catheters and sheath were affixed to the skin with interrupted 0 silk sutures and bandages. Thrombolysis was initiated. The patient tolerated the procedure well was transferred back to the ICU in stable condition. FINDINGS: Right pulmonary artery pressure measured 61/29, mean 41 mmHg. Thrombolysis was initiated 0.5 mg tPA per hour via each catheter. IMPRESSION: Technically successful fluoroscopic guided initiation of bilateral pulmonary artery thrombolysis. Ester Sides, MD [facility] Radiology Electronically Signed   By: Ester Sides M.D.   On: 11/09/2023 05:25   IR Angiogram Pulmonary Bilateral Selective Result Date: 11/09/2023 INDICATION: 73 year old female with history of high risk pulmonary embolism with contraindication to iodinated contrast. EXAM: 1. Ultrasound-guided vascular access of the right internal jugular vein x2. 2. Selective catheterization of the bilateral pulmonary arteries. 3. Initiation of bilateral pulmonary arterial thrombolysis. COMPARISON:  11/08/2023 MEDICATIONS: None. ANESTHESIA/SEDATION: Moderate (conscious) sedation was employed during this procedure. A total of Versed  1 mg and Fentanyl  50 mcg was administered intravenously. Moderate Sedation Time: 28 minutes. The patient's level of consciousness and vital signs were monitored continuously by radiology nursing throughout the procedure under my direct supervision. FLUOROSCOPY TIME:  Twenty mGy reference air kerma CONTRAST:  None. COMPLICATIONS: None immediate. TECHNIQUE: Informed written consent was obtained from the patient after a thorough discussion of  the procedural risks, benefits and alternatives. All questions were addressed. Maximal Sterile Barrier Technique was utilized including caps, mask, sterile gowns, sterile gloves, sterile drape, hand hygiene and skin antiseptic. A timeout was performed prior to the initiation of the procedure. Preprocedure ultrasound evaluation of the right internal jugular vein demonstrated patency. The procedure was planned. At approximately 1 cm apart, 2 separate sites were planned for venous access. Subdermal Local anesthesia was administered at each planned access site. Small skin next from 8. Under direct ultrasound visualization, 2 separate micropuncture sticks were performed and images were saved sonographically in the permanent record. Micropuncture sheath were placed. Through the more central micropuncture, a Wholey wire was inserted over which the micropuncture sheath was exchanged for a 6 French, 10 cm vascular sheath. Over the wire, a 5 French C2 catheter was directed under fluoroscopic visualization to the right inferior lobar pulmonary artery. The catheter was exchanged for a 5 French, 90 cm, 10 cm infusion length UniFuse catheter. Pulmonary manometry was performed. Next, over the more peripheral IJ access site, the micropuncture sheath was exchanged over a Wholey wire for a 6 French, 10 cm vascular sheath. A 5 French C2 catheter was inserted in used to select the left inferior lobar pulmonary artery. The catheter was exchanged for a 5 French, 90 cm, 10 cm infusion length UniFuse catheter. The catheters and sheath were affixed to the skin with interrupted 0 silk sutures and bandages. Thrombolysis was initiated. The patient tolerated the procedure well was transferred back to the ICU in stable condition. FINDINGS: Right pulmonary artery pressure measured 61/29, mean 41 mmHg. Thrombolysis was initiated 0.5 mg tPA per hour via each catheter. IMPRESSION: Technically successful fluoroscopic guided initiation of bilateral  pulmonary artery thrombolysis. Ester Sides, MD [facility] Radiology Electronically Signed   By: Ester Sides M.D.   On: 11/09/2023 05:25   IR Angiogram Selective Each Additional Vessel Result Date: 11/09/2023 INDICATION: 73 year old female with history of high risk pulmonary embolism with contraindication to iodinated contrast. EXAM: 1. Ultrasound-guided vascular access of the right internal jugular vein x2. 2. Selective catheterization of the bilateral pulmonary arteries. 3. Initiation of bilateral pulmonary arterial thrombolysis. COMPARISON:  11/08/2023 MEDICATIONS: None. ANESTHESIA/SEDATION: Moderate (conscious) sedation was employed during this procedure. A total of Versed  1 mg and Fentanyl  50 mcg was administered intravenously. Moderate Sedation Time: 28 minutes. The patient's level of consciousness and vital signs were monitored continuously by radiology nursing throughout the procedure under my direct supervision. FLUOROSCOPY TIME:  Twenty mGy reference air kerma CONTRAST:  None. COMPLICATIONS: None immediate. TECHNIQUE: Informed written consent was obtained from the patient after a thorough discussion of the procedural risks, benefits and alternatives. All questions were addressed. Maximal Sterile Barrier Technique was utilized including caps, mask, sterile gowns, sterile gloves, sterile drape, hand hygiene and skin antiseptic. A timeout was performed prior to the initiation of the procedure. Preprocedure ultrasound evaluation of the right internal jugular vein demonstrated patency. The procedure was planned. At approximately 1 cm apart, 2 separate sites were planned for venous access. Subdermal Local anesthesia was administered at each planned access site. Small skin next from 8. Under direct ultrasound visualization, 2 separate micropuncture sticks were performed and images were saved sonographically in the permanent record. Micropuncture sheath were  placed. Through the more central micropuncture, a Wholey wire was inserted over which the micropuncture sheath was exchanged for a 6 French, 10 cm vascular sheath. Over the wire, a 5 French C2 catheter was directed under fluoroscopic visualization to the right inferior lobar pulmonary artery. The catheter was exchanged for a 5 French, 90 cm, 10 cm infusion length UniFuse catheter. Pulmonary manometry was performed. Next, over the more peripheral IJ access site, the micropuncture sheath was exchanged over a Wholey wire for a 6 French, 10 cm vascular sheath. A 5 French C2 catheter was inserted in used to select the left inferior lobar pulmonary artery. The catheter was exchanged for a 5 French, 90 cm, 10 cm infusion length UniFuse catheter. The catheters and sheath were affixed to the skin with interrupted 0 silk sutures and bandages. Thrombolysis was initiated. The patient tolerated the procedure well was transferred back to the ICU in stable condition. FINDINGS: Right pulmonary artery pressure measured 61/29, mean 41 mmHg. Thrombolysis was initiated 0.5 mg tPA per hour via each catheter. IMPRESSION: Technically successful fluoroscopic guided initiation of bilateral pulmonary artery thrombolysis. Ester Sides, MD [facility] Radiology Electronically Signed   By: Ester Sides M.D.   On: 11/09/2023 05:25   IR Angiogram Selective Each Additional Vessel Result Date: 11/09/2023 INDICATION: 73 year old female with history of high risk pulmonary embolism with contraindication to iodinated contrast. EXAM: 1. Ultrasound-guided vascular access of the right internal jugular vein x2. 2. Selective catheterization of the bilateral pulmonary arteries. 3. Initiation of bilateral pulmonary arterial thrombolysis. COMPARISON:  11/08/2023 MEDICATIONS: None. ANESTHESIA/SEDATION: Moderate (conscious) sedation was employed during this procedure. A total of Versed  1 mg and Fentanyl  50  mcg was administered intravenously. Moderate Sedation Time: 28 minutes. The patient's level of consciousness and vital signs were monitored continuously by radiology nursing throughout the procedure under my direct supervision. FLUOROSCOPY TIME:  Twenty mGy reference air kerma CONTRAST:  None. COMPLICATIONS: None immediate. TECHNIQUE: Informed written consent was obtained from  the patient after a thorough discussion of the procedural risks, benefits and alternatives. All questions were addressed. Maximal Sterile Barrier Technique was utilized including caps, mask, sterile gowns, sterile gloves, sterile drape, hand hygiene and skin antiseptic. A timeout was performed prior to the initiation of the procedure. Preprocedure ultrasound evaluation of the right internal jugular vein demonstrated patency. The procedure was planned. At approximately 1 cm apart, 2 separate sites were planned for venous access. Subdermal Local anesthesia was administered at each planned access site. Small skin next from 8. Under direct ultrasound visualization, 2 separate micropuncture sticks were performed and images were saved sonographically in the permanent record. Micropuncture sheath were placed. Through the more central micropuncture, a Wholey wire was inserted over which the micropuncture sheath was exchanged for a 6 French, 10 cm vascular sheath. Over the wire, a 5 French C2 catheter was directed under fluoroscopic visualization to the right inferior lobar pulmonary artery. The catheter was exchanged for a 5 French, 90 cm, 10 cm infusion length UniFuse catheter. Pulmonary manometry was performed. Next, over the more peripheral IJ access site, the micropuncture sheath was exchanged over a Wholey wire for a 6 French, 10 cm vascular sheath. A 5 French C2 catheter was inserted in used to select the left inferior lobar pulmonary artery. The catheter was exchanged for a 5 French, 90 cm, 10 cm infusion length UniFuse catheter. The catheters  and sheath were affixed to the skin with interrupted 0 silk sutures and bandages. Thrombolysis was initiated. The patient tolerated the procedure well was transferred back to the ICU in stable condition. FINDINGS: Right pulmonary artery pressure measured 61/29, mean 41 mmHg. Thrombolysis was initiated 0.5 mg tPA per hour via each catheter. IMPRESSION: Technically successful fluoroscopic guided initiation of bilateral pulmonary artery thrombolysis. Ester Sides, MD [facility] Radiology Electronically Signed   By: Ester Sides M.D.   On: 11/09/2023 05:25   IR US  Guide Vasc Access Right Result Date: 11/09/2023 INDICATION: 73 year old female with history of high risk pulmonary embolism with contraindication to iodinated contrast. EXAM: 1. Ultrasound-guided vascular access of the right internal jugular vein x2. 2. Selective catheterization of the bilateral pulmonary arteries. 3. Initiation of bilateral pulmonary arterial thrombolysis. COMPARISON:  11/08/2023 MEDICATIONS: None. ANESTHESIA/SEDATION: Moderate (conscious) sedation was employed during this procedure. A total of Versed  1 mg and Fentanyl  50 mcg was administered intravenously. Moderate Sedation Time: 28 minutes. The patient's level of consciousness and vital signs were monitored continuously by radiology nursing throughout the procedure under my direct supervision. FLUOROSCOPY TIME:  Twenty mGy reference air kerma CONTRAST:  None. COMPLICATIONS: None immediate. TECHNIQUE: Informed written consent was obtained from the patient after a thorough discussion of the procedural risks, benefits and alternatives. All questions were addressed. Maximal Sterile Barrier Technique was utilized including caps, mask, sterile gowns, sterile gloves, sterile drape, hand hygiene and skin antiseptic. A timeout was performed prior to the initiation of the procedure. Preprocedure ultrasound evaluation of the right internal jugular  vein demonstrated patency. The procedure was planned. At approximately 1 cm apart, 2 separate sites were planned for venous access. Subdermal Local anesthesia was administered at each planned access site. Small skin next from 8. Under direct ultrasound visualization, 2 separate micropuncture sticks were performed and images were saved sonographically in the permanent record. Micropuncture sheath were placed. Through the more central micropuncture, a Wholey wire was inserted over which the micropuncture sheath was exchanged for a 6 French, 10 cm vascular sheath. Over the wire, a 5  French C2 catheter was directed under fluoroscopic visualization to the right inferior lobar pulmonary artery. The catheter was exchanged for a 5 French, 90 cm, 10 cm infusion length UniFuse catheter. Pulmonary manometry was performed. Next, over the more peripheral IJ access site, the micropuncture sheath was exchanged over a Wholey wire for a 6 French, 10 cm vascular sheath. A 5 French C2 catheter was inserted in used to select the left inferior lobar pulmonary artery. The catheter was exchanged for a 5 French, 90 cm, 10 cm infusion length UniFuse catheter. The catheters and sheath were affixed to the skin with interrupted 0 silk sutures and bandages. Thrombolysis was initiated. The patient tolerated the procedure well was transferred back to the ICU in stable condition. FINDINGS: Right pulmonary artery pressure measured 61/29, mean 41 mmHg. Thrombolysis was initiated 0.5 mg tPA per hour via each catheter. IMPRESSION: Technically successful fluoroscopic guided initiation of bilateral pulmonary artery thrombolysis. Ester Sides, MD [facility] Radiology Electronically Signed   By: Ester Sides M.D.   On: 11/09/2023 05:25   CT HEAD WO CONTRAST ( ) Result Date: 11/08/2023 EXAM: CT HEAD WITHOUT CONTRAST 11/08/2023 06:20:00 PM TECHNIQUE: CT of the head was performed without the  administration of intravenous contrast. Automated exposure control, iterative reconstruction, and/or weight based adjustment of the mA/kV was utilized to reduce the radiation dose to as low as reasonably achievable. COMPARISON: 12/21/2022 CLINICAL HISTORY: starting lytic therapy. ); starting lytic therapy FINDINGS: BRAIN AND VENTRICLES: No acute hemorrhage. No evidence of acute infarct. No hydrocephalus. No extra-axial collection. No mass effect or midline shift. Basal ganglia calcifications. Atherosclerotic calcifications in intracranial carotid and vertebral arteries. ORBITS: No acute abnormality. SINUSES: Mucosal thickening in ethmoid air cells and maxillary sinuses. Trace fluid in left mastoid air cells. SOFT TISSUES AND SKULL: No acute soft tissue abnormality. No skull fracture. IMPRESSION: 1. No acute intracranial abnormality. Electronically signed by: Lonni Necessary MD 11/08/2023 07:10 PM EDT RP Workstation: [id number]   VAS US  LOWER EXTREMITY VENOUS (DVT) Result Date: 11/08/2023  Lower Venous DVT Study Patient Name:  Vickie Collier  Date of Exam:   11/08/2023 Medical Rec #: [id number]        Accession #:    [id number] Date of Birth: Dec 29, 1950       Patient Gender: F Patient Age:   10 years Exam Location:  [facility] Procedure:      VAS US  LOWER EXTREMITY VENOUS (DVT) Referring Phys: TORIBIO SHARPS --------------------------------------------------------------------------------  Indications: Edema, and weakness, lightheadedness, H/O cancer, knee pain, unable to bear weight.  Limitations: Body habitus, poor ultrasound/tissue interface and Unable to properly position. Comparison Study: No prior exam. Performing Technologist: Edilia Elden Appl  Examination Guidelines: A complete evaluation includes B-mode imaging, spectral Doppler, color Doppler, and power Doppler as needed of all accessible portions of each vessel. Bilateral testing is considered an integral part of a complete examination.  Limited examinations for reoccurring indications may be performed as noted. The reflux portion of the exam is performed with the patient in reverse Trendelenburg.  +---------+---------------+---------+-----------+----------+-------------------+ RIGHT    CompressibilityPhasicitySpontaneityPropertiesThrombus Aging      +---------+---------------+---------+-----------+----------+-------------------+ CFV      Full           Yes      Yes                                      +---------+---------------+---------+-----------+----------+-------------------+ SFJ  Full           Yes      Yes                                      +---------+---------------+---------+-----------+----------+-------------------+ FV Prox  Full                                                             +---------+---------------+---------+-----------+----------+-------------------+ FV Mid   Full                                                             +---------+---------------+---------+-----------+----------+-------------------+ FV Distal               No       Yes                  Not well visualized +---------+---------------+---------+-----------+----------+-------------------+ PFV                     Yes      Yes                  Not well visualized +---------+---------------+---------+-----------+----------+-------------------+ POP                     Yes      Yes                  Not well visualized +---------+---------------+---------+-----------+----------+-------------------+ PTV      Full           No       No                                       +---------+---------------+---------+-----------+----------+-------------------+ PERO     Full                                                             +---------+---------------+---------+-----------+----------+-------------------+ Right profunda, distal femoral and popliteal veins are not well visualized due to  patient's body habitus, but are patent with color and doppler. Right posterior tibial veins are compressible but unable to fill one of the pair with color.  +---------+---------------+---------+-----------+----------+-------------------+ LEFT     CompressibilityPhasicitySpontaneityPropertiesThrombus Aging      +---------+---------------+---------+-----------+----------+-------------------+ CFV      Full           Yes      Yes                                      +---------+---------------+---------+-----------+----------+-------------------+ SFJ      Full           Yes  Yes                                      +---------+---------------+---------+-----------+----------+-------------------+ FV Prox  Full                                                             +---------+---------------+---------+-----------+----------+-------------------+ FV Mid   Full                                                             +---------+---------------+---------+-----------+----------+-------------------+ FV Distal               Yes      Yes                  Not well visualized +---------+---------------+---------+-----------+----------+-------------------+ PFV                     Yes      Yes                  Not well visualized +---------+---------------+---------+-----------+----------+-------------------+ POP                     Yes      Yes                  Not well visualized +---------+---------------+---------+-----------+----------+-------------------+ PTV      Full                                                             +---------+---------------+---------+-----------+----------+-------------------+ PERO     Full                                                             +---------+---------------+---------+-----------+----------+-------------------+ Soleal   None           No       No                                        +---------+---------------+---------+-----------+----------+-------------------+ Acute intramuscular thrombosis in the left soleal vein. Left profunda, distal femoral and popliteal veins are not well visualized due to patient's body habitus, but are patent with color and doppler. Left posterior tibial vein and peroneal veins are compressible but unable to fill with color.    Summary: RIGHT: - There is no evidence of deep vein thrombosis in the lower extremity. However, portions of this examination were limited- see technologist comments above.  - No cystic structure found in the popliteal fossa.  LEFT: Findings consistent with acute  intramuscular thrombosis involving the left soleal veins. - No cystic structure found in the popliteal fossa.  *See table(s) above for measurements and observations. Electronically signed by Fonda Rim on 11/08/2023 at 6:52:47 PM.    Final    NM Pulmonary Perfusion Result Date: 11/08/2023 EXAM: NM Lung Perfusion Scan. CLINICAL HISTORY: Heart failure, known or suspected, initial workup; concern for PE. TECHNIQUE: Radiolabeled MAA was administered intravenously and planar images of the lungs were obtained in multiple projections. RADIOPHARMACEUTICAL: 4.4 mCi Tc8m MAA via existing IV @1315  hb. COMPARISON: Chest radiograph 11/07/2023. FINDINGS: PERFUSION: Abnormal perfusion to both lungs with multiple large segmental perfusion defects. This includes a large right upper lobe defect as well as a large defect within the right lung base. Multiple moderate and large-sized perfusion defects are also noted within the upper and lower lobe of the left lung. In the appropriate clinical setting, imaging findings are compatible with pulmonary embolism. IMPRESSION: 1. Multiple large segmental perfusion defects in both lungs, including the right upper lobe and right lung base, as well as the left upper and lower lobes. There are no corresponding pulmonary opacities on the chest radiograph to  account for this finding. . 2. Imaging findings are compatible with pulmonary embolism. 3. Critical results were called to the ordering provider at the time of interpretation on 11/08/2023. I personally spoke with Dr. Lenny Drought, who confirmed these findings. Electronically signed by: Waddell Calk MD 11/08/2023 03:49 PM EDT RP Workstation: [id number]   CARDIAC CATHETERIZATION Result Date: 11/08/2023 Pt on NE 6 and milrinone 0.25 Findings: RA = 13 RV = 59/14 PA = 68/30 (44) PCW = 9 Fick cardiac output/index = 7.4/3.5 TD CO/CI = 4.5/2.1 (more accurate) PVR = 9.2 WU Ao sat = 92% PA sat = 58%, 62% PAPi = 3.0 Assessment: 1. Severe PAH with cor pulmonale 2. Normal left-sided filling pressures Plan/Discussion: Suspect PE (unable to wedge catheter on right - suspect due to clot) Continue heparin. Proceed with VQ. Toribio Fuel, MD 3:10 PM  DG CHEST PORT 1 VIEW Result Date: 11/07/2023 CLINICAL DATA:  Malfunctioning central venous catheter EXAM: PORTABLE CHEST 1 VIEW COMPARISON:  11/06/2023 FINDINGS: Single frontal view of the chest demonstrates left internal jugular central venous catheter tip overlying the atriocaval junction. Cardiac silhouette is enlarged but stable. No acute airspace disease, effusion, or pneumothorax. No acute bony abnormalities. IMPRESSION: 1. Stable enlarged cardiac silhouette. 2. No acute airspace disease. Electronically Signed   By: Ozell Daring M.D.   On: 11/07/2023 16:26   ECHOCARDIOGRAM COMPLETE Result Date: 11/07/2023    ECHOCARDIOGRAM REPORT   Patient Name:   Vickie Collier Date of Exam: 11/07/2023 Medical Rec #:  [id number]       Height:       65.0 in Accession #:    [id number]      Weight:       265.0 lb Date of Birth:  February 19, 1950      BSA:          2.229 m Patient Age:    72 years        BP:           94/70 mmHg Patient Gender: F               HR:           60 bpm. Exam Location:  Inpatient Procedure: 2D Echo, Cardiac Doppler and Color Doppler (Both Spectral and  Color            Flow  Doppler were utilized during procedure). Indications:    Chest Pain R07.9 , NSTEMI I21.4  History:        Patient has no prior history of Echocardiogram examinations.  Sonographer:    Tinnie Gosling RDCS Referring Phys: [id number] ERIC J AUSTRIA IMPRESSIONS  1. Left ventricular ejection fraction, by estimation, is 35 to 40%. The left ventricle has moderately decreased function. The left ventricle demonstrates global hypokinesis. There is mild concentric left ventricular hypertrophy. Left ventricular diastolic parameters are consistent with Grade I diastolic dysfunction (impaired relaxation). There is the interventricular septum is flattened in systole and diastole, consistent with right ventricular pressure and volume overload.  2. Right ventricular systolic function is severely reduced. The right ventricular size is moderately enlarged. There is moderately elevated pulmonary artery systolic pressure. The estimated right ventricular systolic pressure is 57.5 mmHg.  3. Right atrial size was severely dilated.  4. The mitral valve is normal in structure. Trivial mitral valve regurgitation. No evidence of mitral stenosis.  5. Tricuspid valve regurgitation is severe.  6. The aortic valve is tricuspid. There is mild calcification of the aortic valve. Aortic valve regurgitation is not visualized. Mild aortic valve stenosis. Aortic valve mean gradient measures 4.0 mmHg. Aortic valve Vmax measures 1.29 m/s.  7. The inferior vena cava is dilated in size with <50% respiratory variability, suggesting right atrial pressure of 15 mmHg. FINDINGS  Left Ventricle: Left ventricular ejection fraction, by estimation, is 35 to 40%. The left ventricle has moderately decreased function. The left ventricle demonstrates global hypokinesis. The left ventricular internal cavity size was normal in size. There is mild concentric left ventricular hypertrophy. The interventricular septum is flattened in systole and diastole,  consistent with right ventricular pressure and volume overload. Left ventricular diastolic parameters are consistent with Grade I diastolic dysfunction (impaired relaxation). Right Ventricle: The right ventricular size is moderately enlarged. No increase in right ventricular wall thickness. Right ventricular systolic function is severely reduced. There is moderately elevated pulmonary artery systolic pressure. The tricuspid regurgitant velocity is 3.26 m/s, and with an assumed right atrial pressure of 15 mmHg, the estimated right ventricular systolic pressure is 57.5 mmHg. Left Atrium: Left atrial size was normal in size. Right Atrium: Right atrial size was severely dilated. Pericardium: There is no evidence of pericardial effusion. Mitral Valve: The mitral valve is normal in structure. Mild mitral annular calcification. Trivial mitral valve regurgitation. No evidence of mitral valve stenosis. Tricuspid Valve: The tricuspid valve is normal in structure. Tricuspid valve regurgitation is severe. No evidence of tricuspid stenosis. Aortic Valve: The aortic valve is tricuspid. There is mild calcification of the aortic valve. Aortic valve regurgitation is not visualized. Mild aortic stenosis is present. Aortic valve mean gradient measures 4.0 mmHg. Aortic valve peak gradient measures  6.7 mmHg. Aortic valve area, by VTI measures 1.41 cm. Pulmonic Valve: The pulmonic valve was normal in structure. Pulmonic valve regurgitation is trivial. No evidence of pulmonic stenosis. Aorta: The aortic root is normal in size and structure. Venous: The inferior vena cava is dilated in size with less than 50% respiratory variability, suggesting right atrial pressure of 15 mmHg. IAS/Shunts: There is left bowing of the interatrial septum, suggestive of elevated right atrial pressure. No atrial level shunt detected by color flow Doppler.  LEFT VENTRICLE PLAX 2D LVIDd:         3.90 cm   Diastology LVIDs:         3.20 cm   LV e' medial:     4.13 cm/s  LV PW:         1.10 cm   LV E/e' medial:  9.4 LV IVS:        1.00 cm   LV e' lateral:   3.70 cm/s LVOT diam:     2.00 cm   LV E/e' lateral: 10.5 LV SV:         31 LV SV Index:   14 LVOT Area:     3.14 cm  RIGHT VENTRICLE            IVC RV S prime:     9.25 cm/s  IVC diam: 2.30 cm TAPSE (M-mode): 0.8 cm LEFT ATRIUM           Index        RIGHT ATRIUM           Index LA diam:      3.00 cm 1.35 cm/m   RA Area:     32.10 cm LA Vol (A4C): 32.0 ml 14.35 ml/m  RA Volume:   144.00 ml 64.59 ml/m  AORTIC VALVE AV Area (Vmax):    1.67 cm AV Area (Vmean):   1.62 cm AV Area (VTI):     1.41 cm AV Vmax:           129.00 cm/s AV Vmean:          88.500 cm/s AV VTI:            0.223 m AV Peak Grad:      6.7 mmHg AV Mean Grad:      4.0 mmHg LVOT Vmax:         68.50 cm/s LVOT Vmean:        45.500 cm/s LVOT VTI:          0.100 m LVOT/AV VTI ratio: 0.45  AORTA Ao Root diam: 2.70 cm Ao Asc diam:  3.50 cm MITRAL VALVE               TRICUSPID VALVE MV Area (PHT): 2.11 cm    TR Peak grad:   42.5 mmHg MV Decel Time: 359 msec    TR Mean grad:   29.0 mmHg MV E velocity: 39.00 cm/s  TR Vmax:        326.00 cm/s MV A velocity: 48.00 cm/s  TR Vmean:       254.0 cm/s MV E/A ratio:  0.81                            SHUNTS                            Systemic VTI:  0.10 m                            Systemic Diam: 2.00 cm Toribio Fuel MD Electronically signed by Toribio Fuel MD Signature Date/Time: 11/07/2023/12:17:10 PM    Final    DG Chest Port 1 View Result Date: 11/06/2023 CLINICAL DATA:  Shortness of breath. EXAM: PORTABLE CHEST 1 VIEW COMPARISON:  Chest radiograph dated 11/02/2023 FINDINGS: No focal consolidation, pleural effusion or pneumothorax. Stable cardiomegaly. No acute osseous pathology. Degenerative changes of the spine and shoulders. IMPRESSION: 1. No active disease. 2. Cardiomegaly. Electronically Signed   By: Vanetta Chou M.D.   On: 11/06/2023 17:53    Labs:  CBC: Recent Labs    11/07/23 0546  11/08/23 0445 11/08/23 1456 11/08/23 2346 11/09/23 0549  WBC 7.4 7.5  --  6.6 6.2  HGB 9.1* 8.8* 9.9*  9.9* 8.6* 8.4*  HCT 30.9* 28.7* 29.0*  29.0* 27.7* 27.3*  PLT 179 182  --  160 148*    COAGS: No results for input(s): INR, APTT in the last 8760 hours.  BMP: Recent Labs    11/07/23 0546 11/07/23 1508 11/08/23 0445 11/08/23 1456 11/09/23 0549  NA 146* 143 139 141  142 137  K 4.1 4.8 3.8 4.5  4.4 4.0  CL 110 109 103  --  104  CO2 22 19* 22  --  22  GLUCOSE 96 93 106*  --  111*  BUN 29* 32* 36*  --  36*  CALCIUM 8.6* 8.8* 8.6*  --  8.5*  CREATININE 2.36* 2.98* 2.77*  --  2.22*  GFRNONAA 21* 16* 18*  --  23*    LIVER FUNCTION TESTS: Recent Labs    11/06/23 1733 11/07/23 1508 11/08/23 0445 11/09/23 0549  BILITOT 0.5 0.6 0.9 0.9  AST 47* 66* 60* 49*  ALT 6 15 17 18   ALKPHOS 65 48 47 45  PROT 7.8 7.5 6.8 6.6  ALBUMIN 3.7 3.2* 2.9* 2.8*    Assessment and Plan: Pulmonary embolus s/p lysis overnight Infusion completed overnight.  Sheaths removed this AM by IR tech.  PA pressures improved to 31.  Patient subjectively improved.  Procedure sites intact.  No bleeding noted.  No acute signs of bleeding.  Resume heparin.  Ok to resume diet now that infusion complete.   IR remains available.   Electronically Signed: Dalis Beers Sue-Ellen Dianca Owensby, PA 11/09/2023, 9:07 AM   I spent a total of 15 Minutes at the the patient's bedside AND on the patient's hospital floor or unit, greater than 50% of which was counseling/coordinating care for pulmonary embolus.

## 2023-11-09 NOTE — Progress Notes (Signed)
 Advanced Heart Failure Rounding Note  Cardiologist: None  Chief Complaint: BiV HF Subjective:    Underwent catheter-directed t-pa yesterday  Feels much better. SOB resolved. Off NE On milrinone 0.25 Co-ox 64%  Echo LVEF 60-65% RV function much improved. Mild to moderate residual RV dysfunction with McConnell's sign RVSP Personally reviewed  RHC 10/24 on NE 6 and milrinone 0.25     RA = 13 RV = 59/14 PA = 68/30 (44)  PCW = 9 Fick cardiac output/index = 7.4/3.5 TD CO/CI = 4.5/2.1 (more accurate) PVR = 9.2 WU Ao sat = 92% PA sat = 58%, 62% PAPi = 3.0   Objective:    Weight Range: 108 kg Body mass index is 39.62 kg/m.   Vital Signs:   Temp:  [97.2 F (36.2 C)-98.1 F (36.7 C)] 97.7 F (36.5 C) (10/25 1153) Pulse Rate:  [55-90] 78 (10/25 1330) Resp:  [9-26] 18 (10/25 1330) BP: (94-164)/(43-125) 111/61 (10/25 1330) SpO2:  [86 %-100 %] 100 % (10/25 1330) Weight:  [108 kg] 108 kg (10/25 0500) Last BM Date :  (PTA)  Weight change: Filed Weights   11/07/23 2123 11/08/23 0500 11/09/23 0500  Weight: 107.6 kg 103.9 kg 108 kg   Intake/Output:  Intake/Output Summary (Last 24 hours) at 11/09/2023 1349 Last data filed at 11/09/2023 1100 Gross per 24 hour  Intake 1021.89 ml  Output 750 ml  Net 271.89 ml    Physical Exam    General:  Elderly. No resp difficulty HEENT: normal Neck: supple. JVP 8.  Cor: Regular rate & rhythm. No rubs, gallops or murmurs. Lungs: clear Abdomen: soft, nontender, nondistended. No hepatosplenomegaly. No bruits or masses. Good bowel sounds. Extremities: no cyanosis, clubbing, rash, edema Neuro: alert & orientedx3, cranial nerves grossly intact. moves all 4 extremities w/o difficulty. Affect pleasant   Telemetry   SR 70-80 occasional PVCs Personally reviewed  Labs    CBC Recent Labs    11/06/23 1733 11/07/23 0546 11/09/23 0549 11/09/23 1055  WBC 7.9   < > 6.2 6.5  NEUTROABS 6.2  --   --   --   HGB 10.8*   < > 8.4*  8.4*  HCT 36.1   < > 27.3* 27.5*  MCV 86.8   < > 84.5 84.6  PLT 222   < > 148* 150   < > = values in this interval not displayed.   Basic Metabolic Panel Recent Labs    89/75/74 0445 11/08/23 1456 11/09/23 0549  NA 139 141  142 137  K 3.8 4.5  4.4 4.0  CL 103  --  104  CO2 22  --  22  GLUCOSE 106*  --  111*  BUN 36*  --  36*  CREATININE 2.77*  --  2.22*  CALCIUM 8.6*  --  8.5*  MG 1.9  --  1.7  PHOS 5.1*  --   --    Liver Function Tests Recent Labs    11/08/23 0445 11/09/23 0549  AST 60* 49*  ALT 17 18  ALKPHOS 47 45  BILITOT 0.9 0.9  PROT 6.8 6.6  ALBUMIN 2.9* 2.8*   ProBNP (last 3 results) Recent Labs    11/06/23 1733  PROBNP >35,000.0*   Hemoglobin A1C Recent Labs    11/07/23 1508  HGBA1C 5.0   Fasting Lipid Panel Recent Labs    11/07/23 1508  CHOL 166  HDL 45  LDLCALC 96  TRIG 125  CHOLHDL 3.7   Thyroid Function Tests Recent  Labs    11/06/23 1733  TSH 2.230   Medications:    Scheduled Medications:  Chlorhexidine  Gluconate Cloth  6 each Topical Daily   pantoprazole  40 mg Oral Daily   polyethylene glycol  17 g Oral Daily   pregabalin  150 mg Oral BID   senna-docusate  2 tablet Oral Daily   sodium chloride  flush  3 mL Intravenous Q12H   sodium chloride  flush  3 mL Intravenous Q12H    Infusions:  sodium chloride      sodium chloride      sodium chloride      heparin 700 Units/hr (11/09/23 1147)   heparin Stopped (11/09/23 0810)   heparin Stopped (11/09/23 0810)   milrinone 0.125 mcg/kg/min (11/09/23 1100)   norepinephrine (LEVOPHED) Adult infusion 6 mcg/min (11/08/23 1402)    PRN Medications: sodium chloride , sodium chloride , acetaminophen , HYDROmorphone (DILAUDID) injection, midazolam  PF, ondansetron  (ZOFRAN ) IV, ondansetron  (ZOFRAN ) IV, oxyCODONE -acetaminophen , sodium chloride  flush, sodium chloride  flush, technetium albumin aggregated  Patient Profile   Vickie Collier 73 y.o. female with history of HTN, HLD, breast cancer  s/p radiation on letrozole , GERD, arthritis, CKD IIIb. Evaluation for cardiogenic shock.  Assessment/Plan   Cardiogenic shock due to massive PE and cor pulmonale - Echo 10/23: EF 35-40%, septal flattening, severely reduced RV, RA severely dilated, severe TR, dilated IVC - LE u/s +DVT - VQ 10/24 + bilateral PE - RHC 10/24 as above - s/p catheter directed t-PA on 10/24 - Echo 10/25 LVEF 60-65% RV function much improved. Mild to moderate residual RV dysfunction with McConnell's sign RVSP Personally reviewed - Much improved after t-PA - Continue heparin. - If scr < 1.8 next week can proceed with CT chest to assess full PE burden and decide on need for mechanical thrombectomy  Elevated trop - due to massive PE   AKI on CKD IIIb due to ATN/PE - sCr baseline around 1.3 - Cr 2.06>2.98>2.77 -> 2.22   UTI - Primary team treating with ceftriaxone - urine culture pending; BCX ngtd (<12H)   Anemia - Hgb 10.8>9>8.8 -> 8.4 - Trend closely, watch for bleeding - check FOBT - tsat 6; plan for IV Fe  Length of Stay: 3  Toribio Fuel, MD  11/09/2023, 1:49 PM  Advanced Heart Failure Team Pager [phone] (M-F; 7a - 5p)  Please contact [facility] for night-coverage after hours (5p -7a ) and weekends on [email or web address]

## 2023-11-09 NOTE — Progress Notes (Signed)
 NAME:  Vickie Collier, MRN:  [id number], DOB:  June 28, 1950, LOS: 3 ADMISSION DATE:  11/06/2023, CONSULTATION DATE: 11/07/2023 REFERRING MD: [place] CHIEF COMPLAINT: General Weakness, cardiogenic shock  History of Present Illness:  73 yo F PMH HTN HLD breast ca on letrozole  presented to [facility] ED 11/06/23 w CC weakness x1wk + associated SOB, no dizziness, repots some exertional CP and having to sleep sitting up in the recliner   Notably, Was seen in the ED recently  (10/18) w knee pain and dizziness, at that time had an EKG w new T wave inversion and was rec admission/obvs but pt declined and went home instead .  In ED 10/22 elevated trop 1349, markedly elevated pro BNP >35k. Cards engaged by ED and was Started on hep gtt and ASA load.  Admitted to [facility]  On 10/23 pt seen by cards. Pt w ongoing rising trops and progressive hypotension. ECHO w LVEF 35-40% g1 diastolic failure, septal flattening, severely reduced RV failure, moderate RV enlargement and moderately elevated PASP   + Severe TR, mild AS    PCCM consulted in this setting   Pertinent  Medical History  HTN, HLD, HFrEF  Significant Hospital Events: Including procedures, antibiotic start and stop dates in addition to other pertinent events   10/18 ED w dizziness, new EKG changes admission recommended but pt refused 10/22 ED w weakness. Markedly elevated BNP and elevated trop and elevated LA. Hep gtt asa for NSTEMI, [facility] admit 10/23 incr trops, echo w biv failure, seen by cards. Progressive hypotension, pccm consult   Interim History / Subjective:  Patient is s/p bilateral catheter directed tPA thrombolysis overnight by IR for intermediate high risk PE.  Sheath removed a.m. of 10/25, PA pressures improved to 31.  With no acute signs of bleeding.  On heparin gtt.  Otherwise patient is mentating well, sats well on nasal cannula, conversational Perfusion markers lactic 1.2, mixed venous improved to 64-70 On milrinone 0.25, on heparin  gtt. Hemoglobin stable, fibrinogen 427   Objective    Blood pressure (!) 155/125, pulse 75, temperature (!) 97.2 F (36.2 C), temperature source Oral, resp. rate 12, weight 108 kg, SpO2 92%. CVP:  [6 mmHg-31 mmHg] 15 mmHg      Intake/Output Summary (Last 24 hours) at 11/09/2023 0751 Last data filed at 11/09/2023 0602 Gross per 24 hour  Intake 760.45 ml  Output 875 ml  Net -114.55 ml   Filed Weights   11/07/23 2123 11/08/23 0500 11/09/23 0500  Weight: 107.6 kg 103.9 kg 108 kg    Examination: General: Resting in bed, well-nourished HENT: NCAT Lungs: Symmetrical chest wall movement, clear to auscultation Cardiovascular: RRR, S1-S2, no regurg Abdomen: Soft nontender nondistended Extremities: Mild edema Neuro: AAO X3, mentating well, intact motor and sensation GU: Deferred  Resolved problem list   Assessment and Plan    intermediate high PE-s/p B/L CDT tPA thrombolysis on 10/24-PA pressures improved to 31 -troponin leak, proBNP, lactic acid, severe RV dysfunction with RVSP  57 mmHg NSTEMI-renal function precludes LHC at this time Cardiogenic shock-presented with cool extremities, and echo showing mild LV and severe RV dysfunction, initial mixed venous 55, lactate 3.2-resolved Echo-LVEF 25%, severely reduced RV systolic function plus RV dilation, septal flattening with severe TR DVT of LLE AKI on CKD 3B HTN HLD Breast CA s/p resection plus radiation on letrozole       Plan - ICU monitoring and management - Continue heparin GTT - Wean off inotrope as tolerated  - Continue diuresis per HF  team - Trend perfusion markers including lactic acid and mixed venous - Strict I&O's, Foley - Serial H&H - Monitor and replace electrolytes as needed     Lenny Drought, MD  [facility] Prefer epic messenger for cross cover needs   My critical care time: 40 minutes  Critical care time was exclusive of separately billable procedures and treating  other patients.  Critical care was necessary to treat or prevent imminent or life-threatening deterioration.  Critical care was time spent personally by me on the following activities: development of treatment plan with patient and/or surrogate as well as nursing, discussions with consultants, evaluation of patient's response to treatment, examination of patient, obtaining history from patient or surrogate, ordering and performing treatments and interventions, ordering and review of laboratory studies, ordering and review of radiographic studies, pulse oximetry, re-evaluation of patient's condition and participation in multidisciplinary rounds.    Labs   CBC: Recent Labs  Lab 11/02/23 1843 11/06/23 1733 11/07/23 0546 11/08/23 0445 11/08/23 1456 11/08/23 2346 11/09/23 0549  WBC 6.1 7.9 7.4 7.5  --  6.6 6.2  NEUTROABS 5.0 6.2  --   --   --   --   --   HGB 10.8* 10.8* 9.1* 8.8* 9.9*  9.9* 8.6* 8.4*  HCT 34.3* 36.1 30.9* 28.7* 29.0*  29.0* 27.7* 27.3*  MCV 81.9 86.8 88.3 84.4  --  84.7 84.5  PLT 254 222 179 182  --  160 148*    Basic Metabolic Panel: Recent Labs  Lab 11/06/23 1733 11/07/23 0546 11/07/23 1508 11/08/23 0445 11/08/23 1456 11/09/23 0549  NA 144 146* 143 139 141  142 137  K 4.3 4.1 4.8 3.8 4.5  4.4 4.0  CL 107 110 109 103  --  104  CO2 24 22 19* 22  --  22  GLUCOSE 122* 96 93 106*  --  111*  BUN 26* 29* 32* 36*  --  36*  CREATININE 2.06* 2.36* 2.98* 2.77*  --  2.22*  CALCIUM 9.4 8.6* 8.8* 8.6*  --  8.5*  MG  --   --  2.0 1.9  --  1.7  PHOS  --   --   --  5.1*  --   --    GFR: Estimated Creatinine Clearance: 27.6 mL/min (A) (by C-G formula based on SCr of 2.22 mg/dL (H)). Recent Labs  Lab 11/06/23 1741 11/06/23 1954 11/07/23 0546 11/07/23 0800 11/07/23 1508 11/07/23 1750 11/08/23 0445 11/08/23 2346 11/09/23 0549  PROCALCITON  --   --   --  0.43  --   --   --   --   --   WBC  --   --  7.4  --   --   --  7.5 6.6 6.2  LATICACIDVEN 3.9* 2.9*  --    --  3.2* 1.2  --   --   --     Liver Function Tests: Recent Labs  Lab 11/02/23 1843 11/06/23 1733 11/07/23 1508 11/08/23 0445 11/09/23 0549  AST 21 47* 66* 60* 49*  ALT 6 6 15 17 18   ALKPHOS 71 65 48 47 45  BILITOT 1.1 0.5 0.6 0.9 0.9  PROT 8.9* 7.8 7.5 6.8 6.6  ALBUMIN 4.2 3.7 3.2* 2.9* 2.8*   No results for input(s): LIPASE, AMYLASE in the last 168 hours. No results for input(s): AMMONIA in the last 168 hours.  ABG    Component Value Date/Time   HCO3 23.0 11/08/2023 1456   HCO3 23.1 11/08/2023  1456   TCO2 24 11/08/2023 1456   TCO2 24 11/08/2023 1456   ACIDBASEDEF 3.0 (H) 11/08/2023 1456   ACIDBASEDEF 2.0 11/08/2023 1456   O2SAT 63.6 11/09/2023 0549     Coagulation Profile: No results for input(s): INR, PROTIME in the last 168 hours.  Cardiac Enzymes: Recent Labs  Lab 11/02/23 1843  CKTOTAL 84    HbA1C: Hgb A1c MFr Bld  Date/Time Value Ref Range Status  11/07/2023 03:08 PM 5.0 4.8 - 5.6 % Final    Comment:    (NOTE) Diagnosis of Diabetes The following HbA1c ranges recommended by the American Diabetes Association (ADA) may be used as an aid in the diagnosis of diabetes mellitus.  Hemoglobin             Suggested A1C NGSP%              Diagnosis  <5.7                   Non Diabetic  5.7-6.4                Pre-Diabetic  >6.4                   Diabetic  <7.0                   Glycemic control for                       adults with diabetes.      CBG: Recent Labs  Lab 11/02/23 1834 11/07/23 1620  GLUCAP 99 88    Review of Systems:   Negative except above  Past Medical History:  She,  has a past medical history of Arthritis, Atypical ductal hyperplasia of right breast, Breast cancer (HCC) (01/25/2021), Family history of ovarian cancer, Family history of pancreatic cancer, Family history of prostate cancer, Family history of stomach cancer, GERD (gastroesophageal reflux disease), Hyperlipidemia, Hypertension, and Personal history of  radiation therapy.   Surgical History:   Past Surgical History:  Procedure Laterality Date   ABDOMINAL HYSTERECTOMY     BREAST LUMPECTOMY     IR ANGIOGRAM PULMONARY BILATERAL SELECTIVE  11/08/2023   IR ANGIOGRAM SELECTIVE EACH ADDITIONAL VESSEL  11/08/2023   IR ANGIOGRAM SELECTIVE EACH ADDITIONAL VESSEL  11/08/2023   IR INFUSION THROMBOL ARTERIAL INITIAL (MS)  11/08/2023   IR INFUSION THROMBOL ARTERIAL INITIAL (MS)  11/08/2023   IR US  GUIDE VASC ACCESS RIGHT  11/08/2023   JOINT REPLACEMENT Bilateral    TKR   RADIOACTIVE SEED GUIDED EXCISIONAL BREAST BIOPSY Right 01/25/2021   Procedure: RADIOACTIVE SEED GUIDED EXCISIONAL RIGHT BREAST BIOPSY;  Surgeon: Aron Shoulders, MD;  Location: [facility];  Service: General;  Laterality: Right;   RE-EXCISION OF BREAST LUMPECTOMY Right 02/14/2021   Procedure: RE-EXCISION OF RIGHT BREAST LUMPECTOMY;  Surgeon: Aron Shoulders, MD;  Location: [facility];  Service: General;  Laterality: Right;     Social History:   reports that she has never smoked. She has never used smokeless tobacco. She reports that she does not currently use alcohol. She reports that she does not use drugs.   Family History:  Her family history includes Cancer in her cousin; Lung cancer in her cousin; Lupus in her father; Ovarian cancer (age of onset: 65) in her maternal grandmother; Pancreatic cancer in her cousin; Prostate cancer (age of onset: 67) in her half-brother; Stomach cancer in her cousin.   Allergies Allergies  Allergen Reactions   Lisinopril Cough     Home Medications  Prior to Admission medications   Medication Sig Start Date End Date Taking? Authorizing Provider  amLODipine (NORVASC) 10 MG tablet Take 10 mg by mouth daily.   Yes [provider]  baclofen (LIORESAL) 10 MG tablet Take 10 mg by mouth 2 (two) times daily.   Yes [provider]  cyanocobalamin 100 MCG tablet Take 100 mcg by mouth daily.   Yes [provider]  felodipine (PLENDIL) 5 MG 24 hr tablet Take 5 mg by mouth daily.   Yes [provider]  letrozole  (FEMARA ) 2.5 MG tablet Take 1 tablet (2.5 mg total) by mouth daily. 02/19/23  Yes Gudena, Vinay, MD  metoprolol succinate (TOPROL-XL) 50 MG 24 hr tablet Take 50 mg by mouth daily. Take with or immediately following a meal.   Yes [provider]  nebivolol (BYSTOLIC) 10 MG tablet Take 10 mg by mouth daily. 08/20/23  Yes [provider]  omeprazole (PRILOSEC) 40 MG capsule Take 40 mg by mouth 2 (two) times daily as needed. 05/14/23  Yes [provider]  pregabalin (LYRICA) 150 MG capsule Take 1 capsule (150 mg total) by mouth 2 (two) times daily. 11/03/23  Yes Kammerer, Megan L, DO  rosuvastatin (CRESTOR) 5 MG tablet Take 5 mg by mouth daily.   Yes [provider]  valsartan-hydrochlorothiazide (DIOVAN-HCT) 80-12.5 MG tablet Take 1 tablet by mouth daily.   Yes [provider]  cholecalciferol (VITAMIN D3) 25 MCG (1000 UNIT) tablet Take 1,000 Units by mouth daily.    [provider]  lidocaine  4 % Place 1 patch onto the skin daily. Patient not taking: Reported on 11/06/2023 12/21/22   Neysa Caron PARAS, DO     Critical care time: 40 mins       Lenny Drought, MD  [facility] Prefer epic messenger for cross cover needs   My critical care time: 40 minutes  Critical care time was exclusive of separately billable procedures and treating other patients.  Critical care was necessary to treat or prevent imminent or life-threatening deterioration.  Critical care was time spent personally by me on the following activities: development of treatment plan with patient and/or surrogate as well as nursing, discussions with consultants, evaluation of patient's response to treatment, examination of patient, obtaining history from patient or surrogate, ordering and performing treatments and interventions, ordering  and review of laboratory studies, ordering and review of radiographic studies, pulse oximetry, re-evaluation of patient's condition and participation in multidisciplinary rounds.

## 2023-11-10 DIAGNOSIS — I214 Non-ST elevation (NSTEMI) myocardial infarction: Secondary | ICD-10-CM | POA: Diagnosis not present

## 2023-11-10 DIAGNOSIS — I5082 Biventricular heart failure: Secondary | ICD-10-CM | POA: Diagnosis not present

## 2023-11-10 LAB — BRAIN NATRIURETIC PEPTIDE: B Natriuretic Peptide: 940.3 pg/mL — ABNORMAL HIGH (ref 0.0–100.0)

## 2023-11-10 LAB — COMPREHENSIVE METABOLIC PANEL WITH GFR
ALT: 15 U/L (ref 0–44)
AST: 32 U/L (ref 15–41)
Albumin: 2.6 g/dL — ABNORMAL LOW (ref 3.5–5.0)
Alkaline Phosphatase: 50 U/L (ref 38–126)
Anion gap: 11 (ref 5–15)
BUN: 36 mg/dL — ABNORMAL HIGH (ref 8–23)
CO2: 23 mmol/L (ref 22–32)
Calcium: 8.5 mg/dL — ABNORMAL LOW (ref 8.9–10.3)
Chloride: 103 mmol/L (ref 98–111)
Creatinine, Ser: 1.98 mg/dL — ABNORMAL HIGH (ref 0.44–1.00)
GFR, Estimated: 26 mL/min — ABNORMAL LOW (ref >60)
Glucose, Bld: 112 mg/dL — ABNORMAL HIGH (ref 70–99)
Potassium: 4.3 mmol/L (ref 3.5–5.1)
Sodium: 137 mmol/L (ref 135–145)
Total Bilirubin: 0.8 mg/dL (ref 0.0–1.2)
Total Protein: 6.4 g/dL — ABNORMAL LOW (ref 6.5–8.1)

## 2023-11-10 LAB — COOXEMETRY PANEL
Carboxyhemoglobin: 1.1 % (ref 0.5–1.5)
Methemoglobin: <0.7 % (ref 0.0–1.5)
O2 Saturation: 66.8 %
Total hemoglobin: 7 g/dL — ABNORMAL LOW (ref 12.0–16.0)

## 2023-11-10 LAB — HEPARIN LEVEL (UNFRACTIONATED)
Heparin Unfractionated: 0.19 [IU]/mL — ABNORMAL LOW (ref 0.30–0.70)
Heparin Unfractionated: 0.31 [IU]/mL (ref 0.30–0.70)

## 2023-11-10 LAB — LACTIC ACID, PLASMA: Lactic Acid, Venous: 0.9 mmol/L (ref 0.5–1.9)

## 2023-11-10 LAB — TROPONIN I (HIGH SENSITIVITY): Troponin I (High Sensitivity): 2087 ng/L (ref <18)

## 2023-11-10 LAB — CBC
HCT: 25.4 % — ABNORMAL LOW (ref 36.0–46.0)
Hemoglobin: 7.8 g/dL — ABNORMAL LOW (ref 12.0–15.0)
MCH: 25.7 pg — ABNORMAL LOW (ref 26.0–34.0)
MCHC: 30.7 g/dL (ref 30.0–36.0)
MCV: 83.6 fL (ref 80.0–100.0)
Platelets: 143 K/uL — ABNORMAL LOW (ref 150–400)
RBC: 3.04 MIL/uL — ABNORMAL LOW (ref 3.87–5.11)
RDW: 17.8 % — ABNORMAL HIGH (ref 11.5–15.5)
WBC: 5.2 K/uL (ref 4.0–10.5)
nRBC: 1 % — ABNORMAL HIGH (ref 0.0–0.2)

## 2023-11-10 LAB — MAGNESIUM: Magnesium: 2.4 mg/dL (ref 1.7–2.4)

## 2023-11-10 MED ORDER — GUAIFENESIN ER 600 MG PO TB12
1200.0000 mg | ORAL_TABLET | Freq: Two times a day (BID) | ORAL | Status: AC
Start: 2023-11-10 — End: 2023-11-15
  Administered 2023-11-10 – 2023-11-14 (×10): 1200 mg via ORAL
  Filled 2023-11-10 (×10): qty 2

## 2023-11-10 MED ORDER — ORAL CARE MOUTH RINSE: 15.0000 mL | OROMUCOSAL | Status: DC | PRN

## 2023-11-10 MED ORDER — SALINE SPRAY 0.65 % NA SOLN
1.0000 | NASAL | Status: DC | PRN
Start: 2023-11-10 — End: 2023-11-15
  Filled 2023-11-10: qty 44

## 2023-11-10 MED ORDER — SORBITOL 70 % SOLN
30.0000 mL | Freq: Once | Status: AC
  Administered 2023-11-10: 30 mL via ORAL
  Filled 2023-11-10: qty 30

## 2023-11-10 MED ORDER — FUROSEMIDE 10 MG/ML IJ SOLN
40.0000 mg | Freq: Once | INTRAMUSCULAR | Status: AC
  Administered 2023-11-10: 40 mg via INTRAVENOUS
  Filled 2023-11-10: qty 4

## 2023-11-10 NOTE — Progress Notes (Signed)
 PHARMACY - ANTICOAGULATION CONSULT NOTE  Pharmacy Consult for heparin Indication: PE  Allergies  Allergen Reactions   Lisinopril Cough    Patient Measurements: Height: 5' 5 (165.1 cm) Weight: 105.8 kg (233 lb 4 oz) IBW/kg (Calculated) : 57 HEPARIN DW (KG): 81.6 Heparin dosing weight: 86 kg  Vital Signs: Temp: 100.4 F (38 C) (10/26 0800) Temp Source: Axillary (10/26 0800) BP: 130/66 (10/26 1500) Pulse Rate: 76 (10/26 1500)  Labs: Recent Labs    11/07/23 1647 11/07/23 2028 11/08/23 0445 11/08/23 0930 11/09/23 0549 11/09/23 1055 11/09/23 1704 11/10/23 0453 11/10/23 0921 11/10/23 1507  HGB  --   --  8.8*   < > 8.4* 8.4* 8.8* 7.8*  --   --   HCT  --   --  28.7*   < > 27.3* 27.5* 28.5* 25.4*  --   --   PLT  --   --  182   < > 148* 150 157 143*  --   --   HEPARINUNFRC  --    < >  --    < > 0.38 0.34 0.32 0.19*  --  0.31  CREATININE  --   --  2.77*  --  2.22*  --   --  1.98*  --   --   TROPONINIHS 8,860*  --   --   --   --   --   --   --  2,087*  --    < > = values in this interval not displayed.    Estimated Creatinine Clearance: 30.6 mL/min (A) (by C-G formula based on SCr of 1.98 mg/dL (H)).   Medical History: Past Medical History:  Diagnosis Date   Arthritis    Atypical ductal hyperplasia of right breast    Breast cancer (HCC) 01/25/2021   Family history of ovarian cancer    Family history of pancreatic cancer    Family history of prostate cancer    Family history of stomach cancer    GERD (gastroesophageal reflux disease)    Hyperlipidemia    Hypertension    Personal history of radiation therapy     Medications: No anticoagulants PTA  Assessment: 12 yoF admitted with cardiogenic shock found to have PE. Pharmacy dosing IV heparin. Pt s/p bilateral catheter directed lysis 10/24.  Heparin level therapeutic at 0.31 after dropping earlier this morning. Will increase slightly to keep in range.  Goal of Therapy:  Heparin level 0.3-0.7 units/ml Monitor  platelets by anticoagulation protocol: Yes   Plan:  -Increase heparin to 1000 units/h -Daily heparin level and CBC  Ozell Jamaica, PharmD, BCPS, [facility] Clinical Pharmacist [phone] Please check AMION for all [facility] Pharmacy numbers 11/10/2023

## 2023-11-10 NOTE — Progress Notes (Signed)
 Patient arrived from [place], she is alert and oriented, V/S obtained, CCMD notified, denies any pain/discomfort, all needs met, call bell in reach, family at bedside.     11/10/23 1746  Vitals  Temp 99.7 F (37.6 C)  Temp Source Oral  BP 132/72  MAP (mmHg) 88  BP Location Right Leg  BP Method Automatic  Patient Position (if appropriate) Lying  Pulse Rate 84  Pulse Rate Source Monitor  ECG Heart Rate 80  Resp 20  Level of Consciousness  Level of Consciousness Alert  MEWS COLOR  MEWS Score Color Green  Oxygen Therapy  SpO2 91 %  O2 Device Nasal Cannula  O2 Flow Rate (L/min) 1 L/min  Invasive Hemodynamic Monitoring  CVP (mmHg) 13 mmHg  Pain Assessment  Pain Scale 0-10  Pain Score 0  MEWS Score  MEWS Temp 0  MEWS Systolic 0  MEWS Pulse 0  MEWS RR 0  MEWS LOC 0  MEWS Score 0

## 2023-11-10 NOTE — Progress Notes (Signed)
 NAME:  Vickie Collier, MRN:  [id number], DOB:  06/16/1950, LOS: 4 ADMISSION DATE:  11/06/2023, CONSULTATION DATE: 11/07/2023 REFERRING MD: [place] CHIEF COMPLAINT: General Weakness, cardiogenic shock  History of Present Illness:  73 yo F PMH HTN HLD breast ca on letrozole  presented to [facility] ED 11/06/23 w CC weakness x1wk + associated SOB, no dizziness, repots some exertional CP and having to sleep sitting up in the recliner   Notably, Was seen in the ED recently  (10/18) w knee pain and dizziness, at that time had an EKG w new T wave inversion and was rec admission/obvs but pt declined and went home instead .  In ED 10/22 elevated trop 1349, markedly elevated pro BNP >35k. Cards engaged by ED and was Started on hep gtt and ASA load.  Admitted to [facility]  On 10/23 pt seen by cards. Pt w ongoing rising trops and progressive hypotension. ECHO w LVEF 35-40% g1 diastolic failure, septal flattening, severely reduced RV failure, moderate RV enlargement and moderately elevated PASP   + Severe TR, mild AS    PCCM consulted in this setting   Pertinent  Medical History  HTN, HLD, HFrEF  Significant Hospital Events: Including procedures, antibiotic start and stop dates in addition to other pertinent events   10/18 ED w dizziness, new EKG changes admission recommended but pt refused 10/22 ED w weakness. Markedly elevated BNP and elevated trop and elevated LA. Hep gtt asa for NSTEMI, [facility] admit 10/23 incr trops, echo w biv failure, seen by cards. Progressive hypotension, pccm consult   Interim History / Subjective:  Patient is s/p bilateral catheter directed tPA thrombolysis-on 10/24 for intermediate high risk PE, sheath removed on a.m. 10/25.  PA pressures improved.  No acute bleeding  Sats well on nasal cannula, mentating well, conversational, perfusion markers negative On milrinone 0.125 and heparin GTT, hemoglobin stable Will be downgraded today.        Objective    Blood pressure 108/73,  pulse 83, temperature (!) 100.4 F (38 C), temperature source Axillary, resp. rate (!) 21, height 5' 5 (1.651 m), weight 105.8 kg, SpO2 98%. CVP:  [6 mmHg-70 mmHg] 67 mmHg      Intake/Output Summary (Last 24 hours) at 11/10/2023 0841 Last data filed at 11/10/2023 0700 Gross per 24 hour  Intake 1055.45 ml  Output 700 ml  Net 355.45 ml   Filed Weights   11/08/23 0500 11/09/23 0500 11/10/23 0500  Weight: 103.9 kg 108 kg 105.8 kg    Examination: General: Resting in bed, well-nourished HENT: NCAT Lungs: Symmetrical chest wall movement, clear to auscultation Cardiovascular: RRR, S1-S2, no regurg Abdomen: Soft nontender nondistended Extremities: Mild edema Neuro: AAO X3, mentating well, intact motor and sensation GU: Deferred  Resolved problem list   Assessment and Plan    intermediate high PE-s/p B/L CDT tPA thrombolysis on 10/24-PA pressures improved to 31 -troponin leak, proBNP, lactic acid, severe RV dysfunction with RVSP  57 mmHg NSTEMI-renal function precludes LHC at this time Cardiogenic shock-presented with cool extremities, and echo showing mild LV and severe RV dysfunction, initial mixed venous 55, lactate 3.2-resolved Echo-LVEF 25%, severely reduced RV systolic function plus RV dilation, septal flattening with severe TR DVT of LLE AKI on CKD 3B HTN HLD Breast CA s/p resection plus radiation on letrozole     Plan - Stable, no bleeding, stable on milrinone 0.125, with negative perfusion markers - Continue heparin GTT - Wean off inotrope as tolerated  - Continue diuresis per HF team - Trend  perfusion markers including lactic acid and mixed venous - Strict I&O's, Foley - Serial H&H - Monitor and replace electrolytes as needed - Can transfer out of the ICU     Lenny Drought, MD  [facility] Prefer epic messenger for cross cover needs   My critical care time: 40 minutes  Critical care time was exclusive of separately billable  procedures and treating other patients.  Critical care was necessary to treat or prevent imminent or life-threatening deterioration.  Critical care was time spent personally by me on the following activities: development of treatment plan with patient and/or surrogate as well as nursing, discussions with consultants, evaluation of patient's response to treatment, examination of patient, obtaining history from patient or surrogate, ordering and performing treatments and interventions, ordering and review of laboratory studies, ordering and review of radiographic studies, pulse oximetry, re-evaluation of patient's condition and participation in multidisciplinary rounds.    Labs   CBC: Recent Labs  Lab 11/06/23 1733 11/07/23 0546 11/08/23 2346 11/09/23 0549 11/09/23 1055 11/09/23 1704 11/10/23 0453  WBC 7.9   < > 6.6 6.2 6.5 6.3 5.2  NEUTROABS 6.2  --   --   --   --   --   --   HGB 10.8*   < > 8.6* 8.4* 8.4* 8.8* 7.8*  HCT 36.1   < > 27.7* 27.3* 27.5* 28.5* 25.4*  MCV 86.8   < > 84.7 84.5 84.6 84.6 83.6  PLT 222   < > 160 148* 150 157 143*   < > = values in this interval not displayed.    Basic Metabolic Panel: Recent Labs  Lab 11/07/23 0546 11/07/23 1508 11/08/23 0445 11/08/23 1456 11/09/23 0549 11/10/23 0453  NA 146* 143 139 141  142 137 137  K 4.1 4.8 3.8 4.5  4.4 4.0 4.3  CL 110 109 103  --  104 103  CO2 22 19* 22  --  22 23  GLUCOSE 96 93 106*  --  111* 112*  BUN 29* 32* 36*  --  36* 36*  CREATININE 2.36* 2.98* 2.77*  --  2.22* 1.98*  CALCIUM 8.6* 8.8* 8.6*  --  8.5* 8.5*  MG  --  2.0 1.9  --  1.7 2.4  PHOS  --   --  5.1*  --   --   --    GFR: Estimated Creatinine Clearance: 30.6 mL/min (A) (by C-G formula based on SCr of 1.98 mg/dL (H)). Recent Labs  Lab 11/06/23 1954 11/07/23 0546 11/07/23 0800 11/07/23 1508 11/07/23 1750 11/08/23 0445 11/09/23 0549 11/09/23 1055 11/09/23 1704 11/10/23 0453  PROCALCITON  --   --  0.43  --   --   --   --   --   --    --   WBC  --    < >  --   --   --    < > 6.2 6.5 6.3 5.2  LATICACIDVEN 2.9*  --   --  3.2* 1.2  --   --  1.0  --   --    < > = values in this interval not displayed.    Liver Function Tests: Recent Labs  Lab 11/06/23 1733 11/07/23 1508 11/08/23 0445 11/09/23 0549 11/10/23 0453  AST 47* 66* 60* 49* 32  ALT 6 15 17 18 15   ALKPHOS 65 48 47 45 50  BILITOT 0.5 0.6 0.9 0.9 0.8  PROT 7.8 7.5 6.8 6.6 6.4*  ALBUMIN 3.7 3.2* 2.9*  2.8* 2.6*   No results for input(s): LIPASE, AMYLASE in the last 168 hours. No results for input(s): AMMONIA in the last 168 hours.  ABG    Component Value Date/Time   HCO3 23.0 11/08/2023 1456   HCO3 23.1 11/08/2023 1456   TCO2 24 11/08/2023 1456   TCO2 24 11/08/2023 1456   ACIDBASEDEF 3.0 (H) 11/08/2023 1456   ACIDBASEDEF 2.0 11/08/2023 1456   O2SAT 66.8 11/10/2023 0453     Coagulation Profile: No results for input(s): INR, PROTIME in the last 168 hours.  Cardiac Enzymes: No results for input(s): CKTOTAL, CKMB, CKMBINDEX, TROPONINI in the last 168 hours.   HbA1C: Hgb A1c MFr Bld  Date/Time Value Ref Range Status  11/07/2023 03:08 PM 5.0 4.8 - 5.6 % Final    Comment:    (NOTE) Diagnosis of Diabetes The following HbA1c ranges recommended by the American Diabetes Association (ADA) may be used as an aid in the diagnosis of diabetes mellitus.  Hemoglobin             Suggested A1C NGSP%              Diagnosis  <5.7                   Non Diabetic  5.7-6.4                Pre-Diabetic  >6.4                   Diabetic  <7.0                   Glycemic control for                       adults with diabetes.      CBG: Recent Labs  Lab 11/07/23 1620  GLUCAP 88    Review of Systems:   Negative except above  Past Medical History:  She,  has a past medical history of Arthritis, Atypical ductal hyperplasia of right breast, Breast cancer (HCC) (01/25/2021), Family history of ovarian cancer, Family history of pancreatic  cancer, Family history of prostate cancer, Family history of stomach cancer, GERD (gastroesophageal reflux disease), Hyperlipidemia, Hypertension, and Personal history of radiation therapy.   Surgical History:   Past Surgical History:  Procedure Laterality Date   ABDOMINAL HYSTERECTOMY     BREAST LUMPECTOMY     IR ANGIOGRAM PULMONARY BILATERAL SELECTIVE  11/08/2023   IR ANGIOGRAM SELECTIVE EACH ADDITIONAL VESSEL  11/08/2023   IR ANGIOGRAM SELECTIVE EACH ADDITIONAL VESSEL  11/08/2023   IR INFUSION THROMBOL ARTERIAL INITIAL (MS)  11/08/2023   IR INFUSION THROMBOL ARTERIAL INITIAL (MS)  11/08/2023   IR THROMB F/U EVAL ART/VEN FINAL DAY (MS)  11/09/2023   IR US  GUIDE VASC ACCESS RIGHT  11/08/2023   JOINT REPLACEMENT Bilateral    TKR   RADIOACTIVE SEED GUIDED EXCISIONAL BREAST BIOPSY Right 01/25/2021   Procedure: RADIOACTIVE SEED GUIDED EXCISIONAL RIGHT BREAST BIOPSY;  Surgeon: Aron Shoulders, MD;  Location: [facility];  Service: General;  Laterality: Right;   RE-EXCISION OF BREAST LUMPECTOMY Right 02/14/2021   Procedure: RE-EXCISION OF RIGHT BREAST LUMPECTOMY;  Surgeon: Aron Shoulders, MD;  Location: [facility];  Service: General;  Laterality: Right;     Social History:   reports that she has never smoked. She has never used smokeless tobacco. She reports that she does not currently use alcohol. She reports that she does not use  drugs.   Family History:  Her family history includes Cancer in her cousin; Lung cancer in her cousin; Lupus in her father; Ovarian cancer (age of onset: 36) in her maternal grandmother; Pancreatic cancer in her cousin; Prostate cancer (age of onset: 17) in her half-brother; Stomach cancer in her cousin.   Allergies Allergies  Allergen Reactions   Lisinopril Cough     Home Medications  Prior to Admission medications   Medication Sig Start Date End Date Taking? Authorizing Provider  amLODipine (NORVASC) 10 MG tablet Take 10 mg by  mouth daily.   Yes [provider]  baclofen (LIORESAL) 10 MG tablet Take 10 mg by mouth 2 (two) times daily.   Yes [provider]  cyanocobalamin 100 MCG tablet Take 100 mcg by mouth daily.   Yes [provider]  felodipine (PLENDIL) 5 MG 24 hr tablet Take 5 mg by mouth daily.   Yes [provider]  letrozole  (FEMARA ) 2.5 MG tablet Take 1 tablet (2.5 mg total) by mouth daily. 02/19/23  Yes Gudena, Vinay, MD  metoprolol succinate (TOPROL-XL) 50 MG 24 hr tablet Take 50 mg by mouth daily. Take with or immediately following a meal.   Yes [provider]  nebivolol (BYSTOLIC) 10 MG tablet Take 10 mg by mouth daily. 08/20/23  Yes [provider]  omeprazole (PRILOSEC) 40 MG capsule Take 40 mg by mouth 2 (two) times daily as needed. 05/14/23  Yes [provider]  pregabalin (LYRICA) 150 MG capsule Take 1 capsule (150 mg total) by mouth 2 (two) times daily. 11/03/23  Yes Kammerer, Megan L, DO  rosuvastatin (CRESTOR) 5 MG tablet Take 5 mg by mouth daily.   Yes [provider]  valsartan-hydrochlorothiazide (DIOVAN-HCT) 80-12.5 MG tablet Take 1 tablet by mouth daily.   Yes [provider]  cholecalciferol (VITAMIN D3) 25 MCG (1000 UNIT) tablet Take 1,000 Units by mouth daily.    [provider]  lidocaine  4 % Place 1 patch onto the skin daily. Patient not taking: Reported on 11/06/2023 12/21/22   Neysa Caron PARAS, DO     Critical care time: 40 mins       Lenny Drought, MD  [facility] Prefer epic messenger for cross cover needs   My critical care time: 40 minutes  Critical care time was exclusive of separately billable procedures and treating other patients.  Critical care was necessary to treat or prevent imminent or life-threatening deterioration.  Critical care was time spent personally by me on the following activities: development of treatment plan with patient and/or surrogate as  well as nursing, discussions with consultants, evaluation of patient's response to treatment, examination of patient, obtaining history from patient or surrogate, ordering and performing treatments and interventions, ordering and review of laboratory studies, ordering and review of radiographic studies, pulse oximetry, re-evaluation of patient's condition and participation in multidisciplinary rounds.

## 2023-11-10 NOTE — Progress Notes (Signed)
 Advanced Heart Failure Rounding Note  Cardiologist: None  Chief Complaint: BiV HF Subjective:    Underwent catheter-directed t-pa on 10/24  Feels much better. No CP or SOB.   Off NE. On milrinone 0.125. Co-ox 67% Scr 2.22 -> 1.98  Echo LVEF 60-65% RV function much improved. Mild to moderate residual RV dysfunction with McConnell's sign RVSP Personally reviewed    RHC 10/24 on NE 6 and milrinone 0.25   RA = 13 RV = 59/14 PA = 68/30 (44)  PCW = 9 Fick cardiac output/index = 7.4/3.5 TD CO/CI = 4.5/2.1 (more accurate) PVR = 9.2 WU Ao sat = 92% PA sat = 58%, 62% PAPi = 3.0   Objective:    Weight Range: 105.8 kg Body mass index is 38.81 kg/m.   Vital Signs:   Temp:  [97.9 F (36.6 C)-100.4 F (38 C)] 100.4 F (38 C) (10/26 0800) Pulse Rate:  [65-94] 78 (10/26 1200) Resp:  [10-26] 16 (10/26 1200) BP: (93-165)/(35-100) 126/64 (10/26 1200) SpO2:  [84 %-100 %] 94 % (10/26 1200) Weight:  [105.8 kg] 105.8 kg (10/26 0500) Last BM Date :  (PTA)  Weight change: Filed Weights   11/08/23 0500 11/09/23 0500 11/10/23 0500  Weight: 103.9 kg 108 kg 105.8 kg   Intake/Output:  Intake/Output Summary (Last 24 hours) at 11/10/2023 1238 Last data filed at 11/10/2023 0700 Gross per 24 hour  Intake 458.55 ml  Output 700 ml  Net -241.45 ml    Physical Exam    General:  Elderly. Sitting up in bed. No resp difficulty HEENT: normal Neck: supple. no JVD.  Cor: Regular rate & rhythm. No rubs, gallops or murmurs. Lungs: clear Abdomen: obese soft, nontender, nondistended.Good bowel sounds. Extremities: no cyanosis, clubbing, rash, edema Neuro: alert & orientedx3, cranial nerves grossly intact. moves all 4 extremities w/o difficulty. Affect pleasant   Telemetry   SR 70-80 Personally reviewed  Labs    CBC Recent Labs    11/09/23 1704 11/10/23 0453  WBC 6.3 5.2  HGB 8.8* 7.8*  HCT 28.5* 25.4*  MCV 84.6 83.6  PLT 157 143*   Basic Metabolic Panel Recent  Labs    11/08/23 0445 11/08/23 1456 11/09/23 0549 11/10/23 0453  NA 139   < > 137 137  K 3.8   < > 4.0 4.3  CL 103  --  104 103  CO2 22  --  22 23  GLUCOSE 106*  --  111* 112*  BUN 36*  --  36* 36*  CREATININE 2.77*  --  2.22* 1.98*  CALCIUM 8.6*  --  8.5* 8.5*  MG 1.9  --  1.7 2.4  PHOS 5.1*  --   --   --    < > = values in this interval not displayed.   Liver Function Tests Recent Labs    11/09/23 0549 11/10/23 0453  AST 49* 32  ALT 18 15  ALKPHOS 45 50  BILITOT 0.9 0.8  PROT 6.6 6.4*  ALBUMIN 2.8* 2.6*   ProBNP (last 3 results) Recent Labs    11/06/23 1733  PROBNP >35,000.0*   Hemoglobin A1C Recent Labs    11/07/23 1508  HGBA1C 5.0   Fasting Lipid Panel Recent Labs    11/07/23 1508  CHOL 166  HDL 45  LDLCALC 96  TRIG 125  CHOLHDL 3.7   Thyroid Function Tests No results for input(s): TSH, T4TOTAL, T3FREE, THYROIDAB in the last 72 hours.  Invalid input(s): FREET3  Medications:  Scheduled Medications:  Chlorhexidine  Gluconate Cloth  6 each Topical Daily   guaiFENesin  1,200 mg Oral BID   pantoprazole  40 mg Oral Daily   polyethylene glycol  17 g Oral Daily   pregabalin  150 mg Oral BID   senna-docusate  2 tablet Oral Daily   sodium chloride  flush  3 mL Intravenous Q12H   sodium chloride  flush  3 mL Intravenous Q12H    Infusions:  sodium chloride      heparin 900 Units/hr (11/10/23 0700)   milrinone 0.125 mcg/kg/min (11/10/23 0700)   norepinephrine (LEVOPHED) Adult infusion 6 mcg/min (11/08/23 1402)    PRN Medications: acetaminophen , HYDROmorphone (DILAUDID) injection, midazolam  PF, ondansetron  (ZOFRAN ) IV, ondansetron  (ZOFRAN ) IV, oxyCODONE -acetaminophen , sodium chloride , sodium chloride  flush, sodium chloride  flush, technetium albumin aggregated  Patient Profile   Vickie Collier 73 y.o. female with history of HTN, HLD, breast cancer s/p radiation on letrozole , GERD, arthritis, CKD IIIb. Evaluation for cardiogenic  shock.  Assessment/Plan   Cardiogenic shock due to massive PE and cor pulmonale - Echo 10/23: EF 35-40%, septal flattening, severely reduced RV, RA severely dilated, severe TR, dilated IVC - LE u/s +DVT - VQ 10/24 + bilateral PE - RHC 10/24 as above - s/p catheter directed t-PA on 10/24 - Echo 10/25 LVEF 60-65% RV function much improved. Mild to moderate residual RV dysfunction with McConnell's sign RVSP Personally reviewed - Co-ox ok on milrinone 0.125. Will continue one more day  - Much improved after t-PA - Continue heparin - If scr < 1.8 over next few days can proceed with CT chest to assess full PE burden and decide on need for mechanical thrombectomy - Will give one dose IV lasix 40  Elevated trop - due to massive PE   AKI on CKD IIIb due to ATN/PE - sCr baseline around 1.3 - Cr 2.06>2.98>2.77 -> 2.22   UTI - Primary team treating with ceftriaxone - urine culture pending; BCX ngtd (<12H)   Anemia - Hgb 10.8>9>8.8 -> 8.4 -> 7.8  - Trend closely, watch for bleeding - check FOBT - tsat 6; plan for IV Fe  Ok to transfer to floor from my standpoint  Length of Stay: 4  Trashawn Oquendo, MD  11/10/2023, 12:38 PM  Advanced Heart Failure Team Pager [phone] (M-F; 7a - 5p)  Please contact [facility] for night-coverage after hours (5p -7a ) and weekends on [email or web address]

## 2023-11-10 NOTE — Progress Notes (Signed)
 PHARMACY - ANTICOAGULATION CONSULT NOTE  Pharmacy Consult for heparin Indication: pulmonary embolus  Labs: Recent Labs    11/07/23 1508 11/07/23 1647 11/07/23 2028 11/08/23 0445 11/08/23 0930 11/09/23 0549 11/09/23 1055 11/09/23 1704 11/10/23 0453  HGB  --   --   --  8.8*   < > 8.4* 8.4* 8.8* 7.8*  HCT  --   --   --  28.7*   < > 27.3* 27.5* 28.5* 25.4*  PLT  --   --   --  182   < > 148* 150 157 143*  HEPARINUNFRC  --   --    < >  --    < > 0.38 0.34 0.32 0.19*  CREATININE 2.98*  --   --  2.77*  --  2.22*  --   --  1.98*  TROPONINIHS 8,901* 8,860*  --   --   --   --   --   --   --    < > = values in this interval not displayed.   Assessment: 72yo female subtherapeutic on heparin after two levels at low end of goal; no infusion issues or signs of bleeding per RN.  Goal of Therapy:  Heparin level 0.3-0.7 units/ml   Plan:  Increase heparin infusion by 2 units/kg/hr to 900 units/hr. Check level in 8 hours.   Marvetta Dauphin, PharmD, BCPS 11/10/2023 6:33 AM

## 2023-11-11 ENCOUNTER — Inpatient Hospital Stay (HOSPITAL_COMMUNITY)

## 2023-11-11 ENCOUNTER — Encounter (HOSPITAL_COMMUNITY): Payer: Self-pay | Admitting: Internal Medicine

## 2023-11-11 ENCOUNTER — Other Ambulatory Visit (HOSPITAL_COMMUNITY): Payer: Self-pay

## 2023-11-11 ENCOUNTER — Telehealth (HOSPITAL_COMMUNITY): Payer: Self-pay

## 2023-11-11 DIAGNOSIS — R918 Other nonspecific abnormal finding of lung field: Secondary | ICD-10-CM | POA: Diagnosis not present

## 2023-11-11 DIAGNOSIS — J9811 Atelectasis: Secondary | ICD-10-CM | POA: Diagnosis not present

## 2023-11-11 DIAGNOSIS — I249 Acute ischemic heart disease, unspecified: Secondary | ICD-10-CM | POA: Diagnosis not present

## 2023-11-11 DIAGNOSIS — J984 Other disorders of lung: Secondary | ICD-10-CM | POA: Diagnosis not present

## 2023-11-11 DIAGNOSIS — I2699 Other pulmonary embolism without acute cor pulmonale: Secondary | ICD-10-CM | POA: Diagnosis not present

## 2023-11-11 LAB — HEPARIN LEVEL (UNFRACTIONATED): Heparin Unfractionated: 0.4 [IU]/mL (ref 0.30–0.70)

## 2023-11-11 LAB — MAGNESIUM: Magnesium: 2.1 mg/dL (ref 1.7–2.4)

## 2023-11-11 LAB — BASIC METABOLIC PANEL WITH GFR
Anion gap: 12 (ref 5–15)
BUN: 38 mg/dL — ABNORMAL HIGH (ref 8–23)
CO2: 22 mmol/L (ref 22–32)
Calcium: 8.7 mg/dL — ABNORMAL LOW (ref 8.9–10.3)
Chloride: 104 mmol/L (ref 98–111)
Creatinine, Ser: 1.8 mg/dL — ABNORMAL HIGH (ref 0.44–1.00)
GFR, Estimated: 29 mL/min — ABNORMAL LOW (ref >60)
Glucose, Bld: 102 mg/dL — ABNORMAL HIGH (ref 70–99)
Potassium: 4.5 mmol/L (ref 3.5–5.1)
Sodium: 138 mmol/L (ref 135–145)

## 2023-11-11 LAB — COOXEMETRY PANEL
Carboxyhemoglobin: 2.4 % — ABNORMAL HIGH (ref 0.5–1.5)
Methemoglobin: <0.7 % (ref 0.0–1.5)
O2 Saturation: 58.3 %
Total hemoglobin: 8.2 g/dL — ABNORMAL LOW (ref 12.0–16.0)

## 2023-11-11 LAB — CBC
HCT: 24.9 % — ABNORMAL LOW (ref 36.0–46.0)
Hemoglobin: 7.9 g/dL — ABNORMAL LOW (ref 12.0–15.0)
MCH: 26.2 pg (ref 26.0–34.0)
MCHC: 31.7 g/dL (ref 30.0–36.0)
MCV: 82.5 fL (ref 80.0–100.0)
Platelets: 133 K/uL — ABNORMAL LOW (ref 150–400)
RBC: 3.02 MIL/uL — ABNORMAL LOW (ref 3.87–5.11)
RDW: 18 % — ABNORMAL HIGH (ref 11.5–15.5)
WBC: 5.3 K/uL (ref 4.0–10.5)
nRBC: 0.6 % — ABNORMAL HIGH (ref 0.0–0.2)

## 2023-11-11 MED ORDER — IOHEXOL 350 MG/ML SOLN
75.0000 mL | Freq: Once | INTRAVENOUS | Status: AC | PRN
  Administered 2023-11-11: 75 mL via INTRAVENOUS

## 2023-11-11 MED ORDER — POLYETHYLENE GLYCOL 3350 17 G PO PACK
17.0000 g | PACK | Freq: Two times a day (BID) | ORAL | Status: DC
  Administered 2023-11-11 – 2023-11-15 (×3): 17 g via ORAL
  Filled 2023-11-11 (×6): qty 1

## 2023-11-11 MED ORDER — SODIUM CHLORIDE 0.9 % IV SOLN
200.0000 mg | Freq: Once | INTRAVENOUS | Status: AC
  Administered 2023-11-11: 200 mg via INTRAVENOUS
  Filled 2023-11-11: qty 10

## 2023-11-11 MED ORDER — FERROUS SULFATE 325 (65 FE) MG PO TABS
325.0000 mg | ORAL_TABLET | Freq: Every day | ORAL | Status: DC
  Administered 2023-11-12 – 2023-11-15 (×4): 325 mg via ORAL
  Filled 2023-11-11 (×4): qty 1

## 2023-11-11 MED ORDER — SORBITOL 70 % SOLN
30.0000 mL | Freq: Once | Status: AC
  Administered 2023-11-11: 30 mL via ORAL
  Filled 2023-11-11: qty 30

## 2023-11-11 MED ORDER — AMOXICILLIN-POT CLAVULANATE 875-125 MG PO TABS
1.0000 | ORAL_TABLET | Freq: Two times a day (BID) | ORAL | Status: DC
Start: 2023-11-11 — End: 2023-11-15
  Administered 2023-11-11 – 2023-11-15 (×9): 1 via ORAL
  Filled 2023-11-11 (×9): qty 1

## 2023-11-11 NOTE — TOC Progression Note (Signed)
 Transition of Care [facility]) - Progression Note    Patient Details  Name: Vickie Collier MRN: [id number] Date of Birth: 02/16/1950  [facility]) CM/SW Contact  Arlana JINNY Nicholaus [place] Phone Number: [phone] 11/11/2023, 4:24 PM  Clinical Narrative:   4:22 PM- HF CSW called and spoke with the patients daughter to discuss the SNF process. Patients daughter is agreeable to being faxed out. Will email and print a bed offers list.   Deirdre's email is [email or web address]  HF CSW/CM will continue to follow and monitor for dc readiness.     Expected Discharge Plan: IP Rehab Facility Barriers to Discharge: Continued Medical Work up               Expected Discharge Plan and Services   Discharge Planning Services: CM Consult Post Acute Care Choice: IP Rehab Living arrangements for the past 2 months: Single Family Home                                       Social Drivers of Health (SDOH) Interventions SDOH Screenings   Food Insecurity: Patient Unable To Answer (11/08/2023)  Housing: Unknown (11/08/2023)  Transportation Needs: Patient Unable To Answer (11/08/2023)  Utilities: Patient Unable To Answer (11/08/2023)  Financial Resource Strain: Low Risk  (02/10/2019)   Received from [facility]  Physical Activity: Insufficiently Active (08/28/2017)   Received from [facility]  Social Connections: Patient Unable To Answer (11/08/2023)  Stress: No Stress Concern Present (08/28/2017)   Received from [facility]  Tobacco Use: Low Risk  (11/08/2023)    Readmission Risk Interventions     No data to display

## 2023-11-11 NOTE — Progress Notes (Signed)
 PHARMACY - ANTICOAGULATION CONSULT NOTE  Pharmacy Consult for heparin Indication: PE  Allergies  Allergen Reactions   Lisinopril Cough    Patient Measurements: Height: 5' 5 (165.1 cm) Weight: 105.8 kg (233 lb 4 oz) IBW/kg (Calculated) : 57 HEPARIN DW (KG): 81.6 Heparin dosing weight: 86 kg  Vital Signs: Temp: 100.2 F (37.9 C) (10/27 0813) Temp Source: Oral (10/27 0813) BP: 126/63 (10/27 0813) Pulse Rate: 77 (10/27 0813)  Labs: Recent Labs    11/09/23 0549 11/09/23 1055 11/09/23 1704 11/10/23 0453 11/10/23 0921 11/10/23 1507 11/11/23 0615  HGB 8.4*   < > 8.8* 7.8*  --   --  7.9*  HCT 27.3*   < > 28.5* 25.4*  --   --  24.9*  PLT 148*   < > 157 143*  --   --  133*  HEPARINUNFRC 0.38   < > 0.32 0.19*  --  0.31 0.40  CREATININE 2.22*  --   --  1.98*  --   --  1.80*  TROPONINIHS  --   --   --   --  2,087*  --   --    < > = values in this interval not displayed.    Estimated Creatinine Clearance: 33.6 mL/min (A) (by C-G formula based on SCr of 1.8 mg/dL (H)).   Medical History: Past Medical History:  Diagnosis Date   Arthritis    Atypical ductal hyperplasia of right breast    Breast cancer (HCC) 01/25/2021   Family history of ovarian cancer    Family history of pancreatic cancer    Family history of prostate cancer    Family history of stomach cancer    GERD (gastroesophageal reflux disease)    Hyperlipidemia    Hypertension    Personal history of radiation therapy     Medications: No anticoagulants PTA  Assessment: 6 yoF admitted with cardiogenic shock found to have PE. Pharmacy dosing IV heparin. Pt s/p bilateral catheter directed lysis 10/24.  Heparin level therapeutic at 0.4 on heparin drip rate 1000 uts/hr.   CBC stable  Will follow up procedure plans to change to oral anticoagulation when able  Goal of Therapy:  Heparin level 0.3-0.7 units/ml Monitor platelets by anticoagulation protocol: Yes   Plan:  -continue  heparin to 1000  units/h -Daily heparin level and CBC   Olam Chalk Pharm.D. CPP, BCPS Clinical Pharmacist [phone] 11/11/2023 9:29 AM   Please check AMION for all [facility] Pharmacy numbers 11/11/2023

## 2023-11-11 NOTE — Evaluation (Signed)
 Occupational Therapy Evaluation Patient Details Name: Vickie Collier MRN: [id number] DOB: 14-Mar-1950 Today's Date: 11/11/2023   History of Present Illness   Pt is a 73 y/o F admitted 11/06/23 due to generalized weakness ongoing for the past week. Dx with cardiogenic shock, acute PE, AKI. Now s/p bilateral catheter directed tPA thrombolysis-on 10/24 for intermediate high risk PE, sheath removed on a.m. 10/25. Of note: 10/18 ED w dizziness, new EKG changes admission recommended but pt refused PRN includes HTN, HLD, breast cancer s/p radiation on letrozole , GERD, obesity, arthritis, CKD IIIb.     Clinical Impressions Pt typically lives with daughter and grandaughter and for the past year has been living on 1st floor of 2 story condo. She sleeps in lift chair but has been ambulating to bathroom. Sponge bathing unless she can get out to her son's home where she has access to 1st level bathroom. Pt today was max A +2 to total A +2 for bed mobility. Pt DOES put forth excellent effort and attempt - but ultimately does <10% of the work to achieve. She was unable to maintain sitting EOB without at least min A for ADL participation. She also exhibited DOE 2/4 with activity and required return to 2L to maintain Sp02>90%. Pt is max to total for LB ADL, max to min for UB ADL, and will require continued skilled OT in the acute setting as well as afterwards at rehab of <3 hours daily. Next session co-tx with attempt to use Stedy for OOB and better support in sitting.     If plan is discharge home, recommend the following:   Two people to help with walking and/or transfers;Two people to help with bathing/dressing/bathroom;Assist for transportation;Help with stairs or ramp for entrance     Functional Status Assessment   Patient has had a recent decline in their functional status and demonstrates the ability to make significant improvements in function in a reasonable and predictable amount of time.      Equipment Recommendations   Other (comment) (defer to next venue)     Recommendations for Other Services   PT consult     Precautions/Restrictions   Precautions Precautions: Fall Recall of Precautions/Restrictions: Intact Restrictions Weight Bearing Restrictions Per Provider Order: No     Mobility Bed Mobility Overal bed mobility: Needs Assistance Bed Mobility: Rolling, Supine to Sit, Sit to Supine Rolling: Max assist, +2 for physical assistance, +2 for safety/equipment, Used rails (max cues for sequencing)   Supine to sit: Total assist, +2 for physical assistance, +2 for safety/equipment, Used rails (Pt with good effort) Sit to supine: Total assist, +2 for safety/equipment, +2 for physical assistance (helicopter technique used)   General bed mobility comments: Pt with good effort but overall to come to EOB was able to contribue <10% of the work    Regulatory Affairs Officer transfer comment:  (NT this session)      Balance Overall balance assessment: History of Falls, Needs assistance Sitting-balance support: Single extremity supported, Bilateral upper extremity supported, Feet supported Sitting balance-Leahy Scale: Poor Sitting balance - Comments: requires at least min A up to max A Postural control: Posterior lean                                 ADL either performed or assessed with clinical judgement   ADL Overall ADL's :  Needs assistance/impaired Eating/Feeding: Set up   Grooming: Minimal assistance;Bed level Grooming Details (indicate cue type and reason): unable to maintain sitting EOB for grooming tasks, required return supine with HOB elevated Upper Body Bathing: Maximal assistance Upper Body Bathing Details (indicate cue type and reason): for back Lower Body Bathing: Total assistance   Upper Body Dressing : Maximal assistance;Bed level   Lower Body Dressing: Maximal assistance;Bed level   Toilet Transfer: Total  assistance;+2 for physical assistance;+2 for safety/equipment Toilet Transfer Details (indicate cue type and reason): recommend lift at this time Toileting- Clothing Manipulation and Hygiene: Maximal assistance;+2 for physical assistance;+2 for safety/equipment;Bed level       Functional mobility during ADLs: Maximal assistance;+2 for physical assistance;+2 for safety/equipment;Total assistance (Pt performing <10% of work)       Vision Baseline Vision/History: 1 Wears glasses Patient Visual Report: No change from baseline       Perception         Praxis         Pertinent Vitals/Pain Pain Assessment Pain Assessment: 0-10 Pain Score: 10-Worst pain ever Pain Location: Bil LE, hips Pain Descriptors / Indicators: Aching, Discomfort, Constant, Sore Pain Intervention(s): Monitored during session, Repositioned     Extremity/Trunk Assessment Upper Extremity Assessment Upper Extremity Assessment: Generalized weakness   Lower Extremity Assessment Lower Extremity Assessment: Defer to PT evaluation   Cervical / Trunk Assessment Cervical / Trunk Assessment: Other exceptions Cervical / Trunk Exceptions: increased body habitus   Communication Communication Communication: No apparent difficulties   Cognition Arousal: Alert Behavior During Therapy: WFL for tasks assessed/performed, Anxious Cognition: No apparent impairments             OT - Cognition Comments: fear of falling                 Following commands: Intact       Cueing  General Comments   Cueing Techniques: Verbal cues  HR 100-134 throughout session, RA at rest [facility], with activity required 2L to maintain >90% (down to 83% on RA with DOE 2/4)   Exercises     Shoulder Instructions      Home Living Family/patient expects to be discharged to:: Private residence Living Arrangements: Children (daughter and granddaughter) Available Help at Discharge: Family;Available 24 hours/day Type of Home:  Other(Comment) (condo) Home Access: Stairs to enter Entrance Stairs-Number of Steps: 2 Entrance Stairs-Rails: None Home Layout: Two level;1/2 bath on main level (hasnt been upstairs in over a year) Alternate Level Stairs-Number of Steps: flight   Bathroom Shower/Tub: Sponge bathes at baseline   Bathroom Toilet: Handicapped height (but still too low so that she squats over the toilet) Bathroom Accessibility: Yes How Accessible: Accessible via walker Home Equipment: Rollator (4 wheels);Rolling Walker (2 wheels)   Additional Comments: fall about a year ago.      Prior Functioning/Environment Prior Level of Function : Needs assist;History of Falls (last six months)             Mobility Comments: dependent on lift chair for sit<>stand, uses Rollator for bathroom<>chair ADLs Comments: uses cane to assist with LB dressing    OT Problem List: Decreased activity tolerance;Decreased strength;Impaired balance (sitting and/or standing);Decreased safety awareness;Cardiopulmonary status limiting activity;Obesity;Pain;Increased edema   OT Treatment/Interventions: Self-care/ADL training;DME and/or AE instruction;Therapeutic activities;Patient/family education;Balance training      OT Goals(Current goals can be found in the care plan section)   Acute Rehab OT Goals Patient Stated Goal: be able to get in and out of bed by myself  OT Goal Formulation: With patient/family Time For Goal Achievement: 11/25/23 Potential to Achieve Goals: Fair ADL Goals Pt Will Perform Grooming: with set-up;sitting Pt Will Perform Upper Body Dressing: with min assist;sitting Pt Will Perform Lower Body Dressing: with max assist;sit to/from stand Pt Will Transfer to Toilet: with max assist;stand pivot transfer;bedside commode Pt Will Perform Toileting - Clothing Manipulation and hygiene: with max assist;sit to/from stand Additional ADL Goal #1: Pt will maintain seated balance with min guard for 5 min as  precursor to participating in ADL   OT Frequency:  Min 2X/week    Co-evaluation PT/OT/SLP Co-Evaluation/Treatment: Yes Reason for Co-Treatment: For patient/therapist safety;To address functional/ADL transfers;Other (comment) (body habitus) PT goals addressed during session: Balance;Mobility/safety with mobility;Strengthening/ROM OT goals addressed during session: ADL's and self-care;Strengthening/ROM      AM-PAC OT 6 Clicks Daily Activity     Outcome Measure Help from another person eating meals?: A Little Help from another person taking care of personal grooming?: A Little Help from another person toileting, which includes using toliet, bedpan, or urinal?: Total Help from another person bathing (including washing, rinsing, drying)?: A Lot Help from another person to put on and taking off regular upper body clothing?: A Lot Help from another person to put on and taking off regular lower body clothing?: Total 6 Click Score: 12   End of Session Equipment Utilized During Treatment: Oxygen (2L) Nurse Communication: Mobility status;Need for lift equipment;Precautions  Activity Tolerance: Patient tolerated treatment well Patient left: in bed;with call bell/phone within reach;with bed alarm set;with family/visitor present  OT Visit Diagnosis: Unsteadiness on feet (R26.81);Other abnormalities of gait and mobility (R26.89);Muscle weakness (generalized) (M62.81);History of falling (Z91.81);Pain Pain - Right/Left:  (bil) Pain - part of body: Knee;Hip                Time: 9040-8958 OT Time Calculation (min): 42 min Charges:  OT General Charges $OT Visit: 1 Visit OT Evaluation $OT Eval Moderate Complexity: 1 Mod OT Treatments $Self Care/Home Management : 8-22 mins  Leita DEL OTR/L Acute Rehabilitation Services Office: [phone]   Leita PARAS [facility] 11/11/2023, 1:36 PM

## 2023-11-11 NOTE — TOC Initial Note (Signed)
 Transition of Care [facility]) - Initial/Assessment Note    Patient Details  Name: Vickie Collier MRN: [id number] Date of Birth: Aug 30, 1950  Transition of Care [facility]) CM/SW Contact:    Justina Delcia Czar, RN Phone Number: [phone] 11/11/2023, 3:48 PM  Clinical Narrative:                 Spoke to pt's DIL and states pt's dtr was at home assisting with pt's care. Waiting PT recommendations. OT recommending SNF rehab.  DIL requested HF CM speak to dtr about SNF rehab.     Expected Discharge Plan: IP Rehab Facility Barriers to Discharge: Continued Medical Work up   Patient Goals and CMS Choice   CMS Medicare.gov Compare Post Acute Care list provided to:: Patient Represenative (must comment) Choice offered to / list presented to : Adult Children      Expected Discharge Plan and Services   Discharge Planning Services: CM Consult Post Acute Care Choice: IP Rehab Living arrangements for the past 2 months: Single Family Home                                      Prior Living Arrangements/Services Living arrangements for the past 2 months: Single Family Home Lives with:: Adult Children   Do you feel safe going back to the place where you live?: Yes      Need for Family Participation in Patient Care: Yes (Comment) Care giver support system in place?: Yes (comment)      Activities of Daily Living      Permission Sought/Granted Permission sought to share information with : Case Manager, Family Supports, PCP Permission granted to share information with : Yes, Verbal Permission Granted  Share Information with NAME: Deirdre Halperin  Permission granted to share info w AGENCY: PCP, SNF, IP rehab, Home Health, DME  Permission granted to share info w Relationship: daughter  Permission granted to share info w Contact Information: [phone]  Emotional Assessment              Admission diagnosis:  Shortness of breath [R06.02] ACS (acute coronary syndrome) (HCC)  [I24.9] NSTEMI (non-ST elevated myocardial infarction) (HCC) [I21.4] Troponin level elevated [R79.89] Fatigue, unspecified type [R53.83] Patient Active Problem List   Diagnosis Date Noted   NSTEMI (non-ST elevated myocardial infarction) (HCC) 11/07/2023   ACS (acute coronary syndrome) (HCC) 11/06/2023   Genetic testing 04/03/2021   Family history of prostate cancer 03/22/2021   Family history of ovarian cancer 03/22/2021   Family history of pancreatic cancer 03/22/2021   Family history of stomach cancer 03/22/2021   Malignant neoplasm of upper-outer quadrant of right breast in female, estrogen receptor positive (HCC) 02/16/2021   PCP:  Benjamine Aland, MD Pharmacy:   [id number] [address] [place], [place] - [address] AT [facility] [address] & [facility] [address] [place] [address] Phone: [phone] Fax: [phone]     Social Drivers of Health (SDOH) Social History: SDOH Screenings   Food Insecurity: Patient Unable To Answer (11/08/2023)  Housing: Unknown (11/08/2023)  Transportation Needs: Patient Unable To Answer (11/08/2023)  Utilities: Patient Unable To Answer (11/08/2023)  Financial Resource Strain: Low Risk  (02/10/2019)   Received from [facility]  Physical Activity: Insufficiently Active (08/28/2017)   Received from [facility]  Social Connections: Patient Unable To Answer (11/08/2023)  Stress: No  Stress Concern Present (08/28/2017)   Received from [facility]  Tobacco Use: Low Risk  (11/08/2023)   SDOH Interventions:     Readmission Risk Interventions     No data to display

## 2023-11-11 NOTE — Progress Notes (Addendum)
 Advanced Heart Failure Rounding Note  Cardiologist: None  Chief Complaint: BiV HF Subjective:    Underwent catheter-directed t-pa on 10/24  Off NE. On milrinone 0.125. Co-ox 58% Scr 2.22 -> 1.98> 1.8. CVP 6/7  UC with lactobacillus.   Daughter at bedside. Feels ok but reports intt SOB.   Objective:   Echo 11/09/23 LVEF 60-65% RV function much improved. Mild-mod residual RV dysfunction with McConnell's sign RVSP   RHC 10/24 on NE 6 and milrinone 0.25:  RA = 13, RV = 59/14, PA = 68/30 (44) , PCW = 9, Fick cardiac output/index = 7.4/3.5, TD CO/CI = 4.5/2.1 (more accurate), PVR = 9.2 WU, Ao sat = 92%, PA sat = 58%, 62%, PAPi = 3.0   Weight Range: 105.8 kg Body mass index is 38.81 kg/m.   Vital Signs:   Temp:  [98.6 F (37 C)-100.3 F (37.9 C)] 100.2 F (37.9 C) (10/27 0813) Pulse Rate:  [75-94] 77 (10/27 0813) Resp:  [12-20] 15 (10/27 0813) BP: (106-134)/(50-72) 126/63 (10/27 0813) SpO2:  [91 %-99 %] 94 % (10/27 0813) Last BM Date :  (PTA)  Weight change: Filed Weights   11/08/23 0500 11/09/23 0500 11/10/23 0500  Weight: 103.9 kg 108 kg 105.8 kg   Intake/Output:  Intake/Output Summary (Last 24 hours) at 11/11/2023 0902 Last data filed at 11/11/2023 0553 Gross per 24 hour  Intake 973.19 ml  Output 1800 ml  Net -826.81 ml    Physical Exam  General:  elderly appearing.   Neck: JVD ~7 cm.  Cor: Regular rate & rhythm. No murmurs. Lungs: clear, diminished bases Extremities: no edema  Neuro: alert & oriented x 3. Affect pleasant.  Telemetry   NSR-ST 90s-low 100s with PACs (Personally reviewed)    Labs    CBC Recent Labs    11/10/23 0453 11/11/23 0615  WBC 5.2 5.3  HGB 7.8* 7.9*  HCT 25.4* 24.9*  MCV 83.6 82.5  PLT 143* 133*   Basic Metabolic Panel Recent Labs    89/73/74 0453 11/11/23 0615  NA 137 138  K 4.3 4.5  CL 103 104  CO2 23 22  GLUCOSE 112* 102*  BUN 36* 38*  CREATININE 1.98* 1.80*  CALCIUM 8.5* 8.7*  MG 2.4 2.1   Liver  Function Tests Recent Labs    11/09/23 0549 11/10/23 0453  AST 49* 32  ALT 18 15  ALKPHOS 45 50  BILITOT 0.9 0.8  PROT 6.6 6.4*  ALBUMIN 2.8* 2.6*   ProBNP (last 3 results) Recent Labs    11/06/23 1733  PROBNP >35,000.0*   Hemoglobin A1C No results for input(s): HGBA1C in the last 72 hours.  Fasting Lipid Panel No results for input(s): CHOL, HDL, LDLCALC, TRIG, CHOLHDL, LDLDIRECT in the last 72 hours.  Thyroid Function Tests No results for input(s): TSH, T4TOTAL, T3FREE, THYROIDAB in the last 72 hours.  Invalid input(s): FREET3  Medications:    Scheduled Medications:  Chlorhexidine  Gluconate Cloth  6 each Topical Daily   guaiFENesin  1,200 mg Oral BID   pantoprazole  40 mg Oral Daily   polyethylene glycol  17 g Oral Daily   pregabalin  150 mg Oral BID   senna-docusate  2 tablet Oral Daily   sodium chloride  flush  3 mL Intravenous Q12H   sodium chloride  flush  3 mL Intravenous Q12H    Infusions:  heparin 1,000 Units/hr (11/10/23 1700)   milrinone 0.125 mcg/kg/min (11/10/23 1700)    PRN Medications: acetaminophen , HYDROmorphone (DILAUDID) injection, midazolam   PF, ondansetron  (ZOFRAN ) IV, ondansetron  (ZOFRAN ) IV, mouth rinse, oxyCODONE -acetaminophen , sodium chloride , sodium chloride  flush, sodium chloride  flush, technetium albumin aggregated  Patient Profile   Vickie Collier 73 y.o. female with history of HTN, HLD, breast cancer s/p radiation on letrozole , GERD, arthritis, CKD IIIb. Evaluation for cardiogenic shock.  Assessment/Plan  Cardiogenic shock due to massive PE and cor pulmonale - Echo 10/23: EF 35-40%, septal flattening, severely reduced RV, RA severely dilated, severe TR, dilated IVC - LE u/s +DVT - VQ 10/24 + bilateral PE - RHC 10/24 as above - s/p catheter directed t-PA on 10/24 - Echo 10/25 LVEF 60-65% RV function much improved. Mild- mod residual RV dysfunction with McConnell's sign RVSP  - Co-ox 58% on milrinone  0.125. Turn off today.  - Volume stable. CVP 6/7. Does not appear to need additional diuresis at this time.  - Much improved after t-PA - Continue heparin - If scr < 1.8 over next few days can proceed with CT chest to assess full PE burden and decide on need for mechanical thrombectomy. Today?  Elevated trop - due to massive PE   AKI on CKD IIIb due to ATN/PE - sCr baseline around 1.3 - Cr 2.06>2.98>2.77 -> 2.22>1.98>1.8   UTI - Primary team gave ceftriaxone x1 10/23 - Urine culture with lactobacillus; BCX ngtd (x4 days) - Febrile, start augmentin x5 days.    Anemia - Hgb 10.8>9>8.8 -> 8.4 -> 7.8 >7.9 - Trend closely, watch for bleeding - FOBT pending - tsat 6; plan for IV Fe   Length of Stay: 5  Beckey LITTIE Coe, NP  11/11/2023, 9:02 AM  Advanced Heart Failure Team Pager [phone] (M-F; 7a - 5p)  Please contact [facility] for night-coverage after hours (5p -7a ) and weekends on [email or web address]  Patient seen with NP, I formulated the plan and agree with the above note.   She feels better subjectively, still on oxygen but denies dyspnea.   CVP 9 on my read. Co-ox 58%, milrinone stopped this morning. Creatinine 1.98 => 1.8.   General: NAD Neck: JVP 8 cm, no thyromegaly or thyroid nodule.  Lungs: Clear to auscultation bilaterally with normal respiratory effort. CV: Nondisplaced PMI.  Heart regular S1/S2, no S3/S4, no murmur.  No peripheral edema.    Abdomen: Soft, nontender, no hepatosplenomegaly, no distention.  Skin: Intact without lesions or rashes.  Neurologic: Alert and oriented x 3.  Psych: Normal affect. Extremities: No clubbing or cyanosis.  HEENT: Normal.   Agree with stopping milrinone today.  She is probably going to need some Lasix tomorrow but will hold off today in case CTA chest ordered.  With creatinine down to 1.8, I think it would be reasonable to do CTA chest today to guide need for mechanical thrombectomy (has had catheter-directed tPA).   Hgb stable at  7.9, will be getting IV Fe.   Augmentin for UTI.   Ezra Shuck 11/11/2023 12:25 PM

## 2023-11-11 NOTE — Hospital Course (Signed)
 Mrs. Vickie Collier was admitted to the hospital with the diagnosis of acute pulmonary embolism, complicated with cardiogenic shock.   73 year old female with past medical history of breast cancer, HTN, obesity and dyslipidemia who presented with progressive weakness and and dyspnea for 1 week associated with exertional chest pain. Patient had severe symptoms to the point where she was not able to get out of the chair with assistance.   On her initial physical examination she was afebrile, HR 74, BP 122/77, RR 16, SpO2 95% on 2 L [place].  Her lungs has wheezing and rales, heart with S1 and S2 present and regular with no gallops or rubs, abdomen with no distention and no significant lower extremity edema.   Na 144, K 4.3 CL 107 bicarbonate 24 glucose 122 bun 26 cr 2,0  BNP > 35,000 High sensitive troponin 1,349, 1,364  Lactic acid 3,9  Wbc 7,9 hgb 10,8 plt 222  Sars COVID 19 negative RSV negative Influenza negative   Chest radiograph with hypoinflation, positive cardiomegaly with no infiltrates or effusions.    EKG 78 bpm, left axis deviation, normal intervals, qtc 543, sinus rhythm with no significant ST segment changes, diffuse T wave inversions.   Patient was started on heparin drip for suspected acute coronary syndrome, non ST elevation MI.   On 10/23, patient had continued rise in troponins to peak of 8901 with rising lactic acid to 3.2, creatinine elevated to 2.36, hypotensive with cool extremities, concerning for cardiogenic shock.   TTE showed LVEF 35-40%, with signs of RV failure.   10/23 PCCM was consulted and she was transferred to [facility] and started on milrinone.  10/24, she underwent RHC which showed severe PAH with cor pulmonale and normal left-sided filling pressures suspicious for PE.   VQ scan concerning for PE with multiple large segmental perfusion defects in bilaterally and no corresponding CXR abnormalities.   US  LE Doppler showed DVT in left soleal vein.   IR was consulted and  she underwent bilateral catheter directed thrombolysis with tPA infusion for 12 hours, with further improvement in pulmonary pressures.  10/25, norepinephrine was stopped.  TTE showed LVEF 60-65%,  10/26 transfer to [facility].  10/28 transitioned to apixaban with good toleration.  10/29 transitioned to po torsemide.  10/31 patient medical stable for discharge, follow up with as outpatient.

## 2023-11-11 NOTE — Plan of Care (Signed)

## 2023-11-11 NOTE — Telephone Encounter (Signed)
 Pharmacy Patient Advocate Encounter  Insurance verification completed.    The patient is insured through [employer]. Patient has Medicare and is not eligible for a copay card, but may be able to apply for patient assistance or Medicare RX Payment Plan (Patient Must reach out to their plan, if eligible for payment plan), if available.    Ran test claim for Eliquis 5mg  and the current 30 day co-pay is $12.15.   This test claim was processed through [facility]- copay amounts may vary at other pharmacies due to pharmacy/plan contracts, or as the patient moves through the different stages of their insurance plan.

## 2023-11-11 NOTE — Progress Notes (Signed)
 Progress Note   Patient: Vickie Collier FMW:[id number] DOB: Jun 29, 1950 DOA: 11/06/2023     5 DOS: the patient was seen and examined on 11/11/2023   Brief hospital course: 73 year old female with PMHx breast cancer on letrozole , HTN, HLD who presented to [facility] ED on 11/06/2023 with complaints of progressive weakness and shortness of breath for 1 week associated with exertional chest pain.   Notably, she was evaluated in the ED on 11/02/2023 for dizziness and knee pain.  EKG at that time was notable for new T wave inversions compared to previous EKG in 2023. hsTroponins were slightly elevated but downtrending, 46 > 32.  She was recommended to be admitted for observation but elected to go home per ED report.  On arrival this time, she was afebrile, HR 74, BP 122/77, RR 16, SpO2 95% on 2 L [place].  CBC remarkable for hemoglobin 10.8.  CMP showed creatinine 2.06.  Lactic acid elevated to 3.9.  BNP >35,000.  High-sensitivity troponins elevated to 1349 > 1364 > 2175.  UA suspicious for UTI. CXR negative for acute cardiopulmonary disease.  EKG showed NSR, T wave inversions in leads I, II, III, aVF, V1 through V6.  Patient was started on heparin drip for NSTEMI and admitted.  On 10/23, patient had continued rise in troponins to peak of 8901 with rising lactic acid to 3.2, creatinine elevated to 2.36, hypotensive with cool extremities, concerning for cardiogenic shock.  TTE showed LVEF 35-40%, global hypokinesis, mild concentric LVH, flattened interventricular septum consistent with right ventricular pressure and volume overload, severely reduced RV, moderately elevated PASP at 57.5 mmHg, severely dilated RA, severe TR, mild AS, dilated IVC. PCCM was consulted and she was transferred to [facility] 2H and started on milrinone.   On 10/24, she underwent RHC which showed severe PAH with cor pulmonale and normal left-sided filling pressures suspicious for PE.  VQ scan (11/08/2023) concerning for PE with multiple large  segmental perfusion defects in bilaterally and no corresponding CXR abnormalities.  US  LE Doppler showed DVT in left soleal vein.  IR was consulted and she underwent B/L CDT with tPA infusion for 12 hours, and improvement in PA pressures to 31.  On 10/25, Levophed was stopped.  TTE showed LVEF 60-65%, no RWMA, flattened interventricular septum consistent with right ventricular pressure and volume overload, moderately reduced RV severely elevated PASP at 60.4 mmHg, mild to moderate TR.  On 10/26, downgraded from ICU.   Assessment and Plan:   # Massive bilateral PE with cor pulmonale # Acute BiV HF c/b cardiogenic shock secondary to massive PE # LLE DVT - Presented with progressive shortness of breath and weakness with exertional chest pain for 1 week.  - TTE (10/23) - EF 35-40%, global hypokinesis, septal flattening, severely reduced RV, PASP 57.5, severely dilated RA, severe TR, dilated IVC - RHC (10/24) severe PAH with cor pulmonale  - VQ (10/24) positive for b/l PE - IR consulted - status post b/l CDT tPA on 10/24 - TTE (10/25) - EF 60-65%, no RWMA, residual septal flattening, PASP 60.4 mmHg, mild-mod TR - Adv HF following - off milrinone today and no further diuresis  - CTA chest (10/27) multiple lobar and segmental PEs in both lungs without central embolism; main PA normal in size. - Continue heparin drip - SpO2 98% on 3L/min [place]  # Type 2 MI - Troponins peaked at 8901 - Type 2 MI secondary to right heart strain from acute massive PE  # AKI on CKD stage IIIb -  Cr baseline ~ 1.40 - Cr elevated to  max 2.98 in the setting of cardiogenic shock/massive PE - Improved to 1.80 today  # Acute respiratory failure with hypoxia - Secondary to massive b/l PE w/ cor pulmonale/acute BiV HF - On 3L/min [place] maintaining SpO2 98% - Wean as tolerated  # Atrial Fibrillation - Noted to be in Afib on telemetry today - HR 80-130s - On heparin drip - Cardiology following  # UTI - UA on admission  c/w UTI - UCx + >100K lactobacillus - BCx NGTD - Started on Augmentin  # Anemia, normocytic - Hgb 7.9, down from 10.8 on admission  - No signs or symptoms of overt bleeding - FOBT pending collection  - Total Fe deficit ~ 1,785 mg - IV Venofer 200 mg x 1 ordered - PO Ferrous sulfate 325 mg daily starting tomorrow  # Breast cancer s/p radiation and right lumpectomy - Currently on letrozole  therapy - Follows with medical oncology, Dr. Odean  # Class II Obesity (BMI Body mass index is 38.81 kg/m. kg/m) - Obesity is clinically significant and contributes to HTN and HLD.       Subjective: Patient seen bedside.  No acute events overnight.  Patient was transferred out of ICU yesterday.  Overall, she reports doing relatively well today, but still short of breath and fatigued.   Physical Exam: Vitals:   11/10/23 2013 11/11/23 0551 11/11/23 0813 11/11/23 1200  BP: (!) 120/59 134/72 126/63   Pulse: 83 94 77 88  Resp: 20 14 15 14   Temp: 98.6 F (37 C) 100.3 F (37.9 C) 100.2 F (37.9 C)   TempSrc: Axillary Axillary Oral   SpO2: 94% 94% 94% 96%  Weight:      Height:       Physical Exam Constitutional:      General: She is not in acute distress.    Appearance: She is ill-appearing.  Neck:     Comments: LIJ CVC in place Cardiovascular:     Rate and Rhythm: Tachycardia present. Rhythm irregularly irregular.     Heart sounds: Normal heart sounds. No murmur heard. Pulmonary:     Effort: Pulmonary effort is normal. No respiratory distress.     Breath sounds: Normal breath sounds. No wheezing.  Abdominal:     General: There is no distension.     Palpations: Abdomen is soft.     Tenderness: There is no abdominal tenderness.  Musculoskeletal:     Right lower leg: No edema.     Left lower leg: No edema.  Skin:    General: Skin is warm and dry.     Capillary Refill: Capillary refill takes less than 2 seconds.  Neurological:     Mental Status: She is alert and oriented to  person, place, and time. Mental status is at baseline.    Family Communication: Discussed with patient's son Odis and daughter Dierdre over the phone  Disposition: Status is: Inpatient Remains inpatient appropriate because: massive PE w/ cor pulmonale, AKI, UTI  Planned Discharge Destination: Pending PT/OT eval    Time spent: 65 minutes  Author: Duffy Larch, MD 11/11/2023 12:52 PM  For on call review [email or web address].

## 2023-11-11 NOTE — Evaluation (Signed)
 Physical Therapy Evaluation Patient Details Name: Vickie Collier MRN: [id number] DOB: 1950/05/19 Today's Date: 11/11/2023  History of Present Illness  Pt is a 73 y/o F admitted 11/06/23 due to generalized weakness ongoing for the past week. Dx with cardiogenic shock, acute PE, AKI. Now s/p bilateral catheter directed tPA thrombolysis-on 10/24 for intermediate high risk PE, sheath removed on a.m. 10/25. Of note: 10/18 ED w dizziness, new EKG changes admission recommended but pt refused PRN includes HTN, HLD, breast cancer s/p radiation on letrozole , GERD, obesity, arthritis, CKD IIIb.  Clinical Impression  Pt is currently presenting below baseline level of functioning. Total A for bed mobility, and Min to Max A for sitting EOB with heavy posterior lean. Pt limited significantly by pain in the bil knees/hips and very limited P/ROM. Due to pt current functional status, home set up and available assistance at home recommending skilled physical therapy services < 3 hours/day in order to address strength, balance and functional mobility to decrease risk for falls, injury, immobility, skin break down and re-hospitalization.          If plan is discharge home, recommend the following: A little help with walking and/or transfers;Assistance with cooking/housework;Assist for transportation;Help with stairs or ramp for entrance   Can travel by private vehicle   No    Equipment Recommendations Wheelchair cushion (measurements PT);Wheelchair (measurements PT);Hospital bed;Hoyer lift (bariatric equipment)     Functional Status Assessment Patient has had a recent decline in their functional status and demonstrates the ability to make significant improvements in function in a reasonable and predictable amount of time.     Precautions / Restrictions Precautions Precautions: Fall Recall of Precautions/Restrictions: Intact Restrictions Weight Bearing Restrictions Per Provider Order: No      Mobility   Bed Mobility Overal bed mobility: Needs Assistance Bed Mobility: Rolling, Supine to Sit, Sit to Supine Rolling: Max assist, +2 for physical assistance, +2 for safety/equipment, Used rails (max cues for sequencing)   Supine to sit: Total assist, +2 for physical assistance, +2 for safety/equipment, Used rails (Pt with good effort) Sit to supine: Total assist, +2 for safety/equipment, +2 for physical assistance (helicopter technique used)   General bed mobility comments: Pt with good effort but overall to come to EOB was able to contribue <10% of the work    [facility] transfer comment: unable at this time. (NT this session)       Balance Overall balance assessment: History of Falls, Needs assistance Sitting-balance support: Single extremity supported, Bilateral upper extremity supported, Feet supported Sitting balance-Leahy Scale: Poor Sitting balance - Comments: requires at least min A up to max A Postural control: Posterior lean           Pertinent Vitals/Pain Pain Assessment Pain Assessment: 0-10 Pain Score: 10-Worst pain ever Pain Location: Bil LE, hips Pain Descriptors / Indicators: Aching, Discomfort, Constant, Sore Pain Intervention(s): Monitored during session, Repositioned    Home Living Family/patient expects to be discharged to:: Private residence Living Arrangements: Children (daughter and granddaughter) Available Help at Discharge: Family;Available 24 hours/day Type of Home: Other(Comment) (condo) Home Access: Stairs to enter Entrance Stairs-Rails: None Entrance Stairs-Number of Steps: 2 Alternate Level Stairs-Number of Steps: flight Home Layout: Two level;1/2 bath on main level (hasnt been upstairs in over a year) Home Equipment: Rollator (4 wheels);Rolling Walker (2 wheels) Additional Comments: fall about a year ago.    Prior Function Prior Level of Function : Needs assist;History of Falls (last six months)  Mobility Comments:  dependent on lift chair for sit<>stand, uses Rollator for bathroom<>chair ADLs Comments: uses cane to assist with LB dressing     Extremity/Trunk Assessment   Upper Extremity Assessment Upper Extremity Assessment: Defer to OT evaluation    Lower Extremity Assessment Lower Extremity Assessment: Generalized weakness;RLE deficits/detail;LLE deficits/detail RLE Deficits / Details: limited P/ROM, no active ROM without significant pain RLE: Unable to fully assess due to pain LLE Deficits / Details: limited P/ROM, no active ROM without significant pain LLE: Unable to fully assess due to pain    Cervical / Trunk Assessment Cervical / Trunk Assessment: Other exceptions Cervical / Trunk Exceptions: increased body habitus  Communication   Communication Communication: No apparent difficulties    Cognition Arousal: Alert Behavior During Therapy: WFL for tasks assessed/performed, Anxious   PT - Cognitive impairments: No apparent impairments     Following commands: Intact       Cueing Cueing Techniques: Verbal cues     General Comments General comments (skin integrity, edema, etc.): HR 100-134 throughout session, RA at rest [facility], wtih activity required 2L O2 via [place] for O2 sats above 90%. 83% on room air with activity        Assessment/Plan    PT Assessment Patient needs continued PT services  PT Problem List Decreased strength;Decreased activity tolerance;Decreased range of motion;Decreased balance;Decreased mobility;Pain       PT Treatment Interventions DME instruction;Balance training;Gait training;Functional mobility training;Therapeutic activities;Therapeutic exercise;Wheelchair mobility training;Patient/family education;Manual techniques    PT Goals (Current goals can be found in the Care Plan section)  Acute Rehab PT Goals Patient Stated Goal: to improve mobility and pain PT Goal Formulation: With patient/family Time For Goal Achievement: 11/25/23 Potential to Achieve  Goals: Fair    Frequency Min 1X/week     Co-evaluation   Reason for Co-Treatment: For patient/therapist safety;To address functional/ADL transfers;Other (comment) PT goals addressed during session: Balance;Mobility/safety with mobility;Strengthening/ROM OT goals addressed during session: ADL's and self-care;Strengthening/ROM       AM-PAC PT 6 Clicks Mobility  Outcome Measure Help needed turning from your back to your side while in a flat bed without using bedrails?: Total Help needed moving from lying on your back to sitting on the side of a flat bed without using bedrails?: Total Help needed moving to and from a bed to a chair (including a wheelchair)?: Total Help needed standing up from a chair using your arms (e.g., wheelchair or bedside chair)?: Total Help needed to walk in hospital room?: Total Help needed climbing 3-5 steps with a railing? : Total 6 Click Score: 6    End of Session   Activity Tolerance: Patient tolerated treatment well;Patient limited by pain Patient left: in bed;with call bell/phone within reach;with bed alarm set;with family/visitor present Nurse Communication: Mobility status PT Visit Diagnosis: Other abnormalities of gait and mobility (R26.89);Muscle weakness (generalized) (M62.81);Pain Pain - Right/Left:  (bil) Pain - part of body: Hip;Knee    Time: 8992-8960 PT Time Calculation (min) (ACUTE ONLY): 32 min   Charges:   PT Evaluation $PT Eval Low Complexity: 1 Low   PT General Charges $$ ACUTE PT VISIT: 1 Visit        Dorothyann Maier, DPT, CLT  Acute Rehabilitation Services Office: [phone] (Secure chat preferred)   Dorothyann VEAR Maier 11/11/2023, 3:48 PM

## 2023-11-12 DIAGNOSIS — I249 Acute ischemic heart disease, unspecified: Secondary | ICD-10-CM | POA: Diagnosis not present

## 2023-11-12 LAB — BASIC METABOLIC PANEL WITH GFR
Anion gap: 11 (ref 5–15)
BUN: 34 mg/dL — ABNORMAL HIGH (ref 8–23)
CO2: 23 mmol/L (ref 22–32)
Calcium: 8.8 mg/dL — ABNORMAL LOW (ref 8.9–10.3)
Chloride: 103 mmol/L (ref 98–111)
Creatinine, Ser: 1.47 mg/dL — ABNORMAL HIGH (ref 0.44–1.00)
GFR, Estimated: 37 mL/min — ABNORMAL LOW (ref >60)
Glucose, Bld: 91 mg/dL (ref 70–99)
Potassium: 4.5 mmol/L (ref 3.5–5.1)
Sodium: 137 mmol/L (ref 135–145)

## 2023-11-12 LAB — CBC
HCT: 26.5 % — ABNORMAL LOW (ref 36.0–46.0)
Hemoglobin: 8.3 g/dL — ABNORMAL LOW (ref 12.0–15.0)
MCH: 25.8 pg — ABNORMAL LOW (ref 26.0–34.0)
MCHC: 31.3 g/dL (ref 30.0–36.0)
MCV: 82.3 fL (ref 80.0–100.0)
Platelets: 146 K/uL — ABNORMAL LOW (ref 150–400)
RBC: 3.22 MIL/uL — ABNORMAL LOW (ref 3.87–5.11)
RDW: 18.1 % — ABNORMAL HIGH (ref 11.5–15.5)
WBC: 5.2 K/uL (ref 4.0–10.5)
nRBC: 0 % (ref 0.0–0.2)

## 2023-11-12 LAB — CULTURE, BLOOD (ROUTINE X 2)
Culture: NO GROWTH
Culture: NO GROWTH
Special Requests: ADEQUATE

## 2023-11-12 LAB — COOXEMETRY PANEL
Carboxyhemoglobin: 1.6 % — ABNORMAL HIGH (ref 0.5–1.5)
Methemoglobin: <0.7 % (ref 0.0–1.5)
O2 Saturation: 67.6 %
Total hemoglobin: 9.5 g/dL — ABNORMAL LOW (ref 12.0–16.0)

## 2023-11-12 LAB — MAGNESIUM: Magnesium: 1.9 mg/dL (ref 1.7–2.4)

## 2023-11-12 LAB — HEPARIN LEVEL (UNFRACTIONATED): Heparin Unfractionated: 0.58 [IU]/mL (ref 0.30–0.70)

## 2023-11-12 MED ORDER — FUROSEMIDE 10 MG/ML IJ SOLN
60.0000 mg | Freq: Once | INTRAMUSCULAR | Status: AC
  Administered 2023-11-12: 60 mg via INTRAVENOUS
  Filled 2023-11-12: qty 6

## 2023-11-12 MED ORDER — APIXABAN 5 MG PO TABS
10.0000 mg | ORAL_TABLET | Freq: Two times a day (BID) | ORAL | Status: DC
Start: 2023-11-12 — End: 2023-11-15
  Administered 2023-11-12 – 2023-11-15 (×7): 10 mg via ORAL
  Filled 2023-11-12: qty 4
  Filled 2023-11-12: qty 2
  Filled 2023-11-12: qty 4
  Filled 2023-11-12 (×4): qty 2

## 2023-11-12 MED ORDER — AMLODIPINE BESYLATE 5 MG PO TABS
5.0000 mg | ORAL_TABLET | Freq: Every day | ORAL | Status: DC
  Administered 2023-11-12 – 2023-11-15 (×3): 5 mg via ORAL
  Filled 2023-11-12 (×4): qty 1

## 2023-11-12 MED ORDER — MAGNESIUM SULFATE 2 GM/50ML IV SOLN
2.0000 g | Freq: Once | INTRAVENOUS | Status: AC
  Administered 2023-11-12: 2 g via INTRAVENOUS
  Filled 2023-11-12: qty 50

## 2023-11-12 MED ORDER — APIXABAN 5 MG PO TABS: 5.0000 mg | ORAL_TABLET | Freq: Two times a day (BID) | ORAL | Status: DC

## 2023-11-12 MED ORDER — HYDROCERIN EX CREA
TOPICAL_CREAM | Freq: Two times a day (BID) | CUTANEOUS | Status: DC
  Administered 2023-11-12 – 2023-11-14 (×2): 1 via TOPICAL
  Filled 2023-11-12: qty 113

## 2023-11-12 NOTE — TOC Progression Note (Signed)
 Transition of Care [facility]) - Progression Note    Patient Details  Name: Vickie Collier MRN: [id number] Date of Birth: 18-May-1950  Transition of [facility]) CM/SW Contact  Arlana JINNY Moats, LCSWA Phone Number: 11/12/2023, 3:36 PM  Clinical Narrative:   At this time the only offer is [facility]. At this time they do not have any beds available. However, patient is not medically ready for dc. Will continue to monitor bed availability. CSW emailed the patients daughter.                                 Skilled Nursing Rehab Facilities-   [email or web address]  ?  ?  Ratings out of 5 stars (5 the highest)  ?   Name  Address   Phone #  Quality Care  Staffing  Health Inspection  Overall   [facility]  [address], [place]  [phone]  5  1  2  2      HF CSW/CM will continue to follow and monitor for dc readiness.     Expected Discharge Plan: IP Rehab Facility Barriers to Discharge: Continued Medical Work up               Expected Discharge Plan and Services   Discharge Planning Services: CM Consult Post Acute Care Choice: IP Rehab Living arrangements for the past 2 months: Single Family Home                                       Social Drivers of Health (SDOH) Interventions SDOH Screenings   Food Insecurity: Patient Unable To Answer (11/08/2023)  Housing: Unknown (11/08/2023)  Transportation Needs: Patient Unable To Answer (11/08/2023)  Utilities: Patient Unable To Answer (11/08/2023)  Financial Resource Strain: Low Risk  (02/10/2019)   Received from [facility]  Physical Activity: Insufficiently Active (08/28/2017)   Received from [facility]  Social Connections: Patient Unable To Answer (11/08/2023)  Stress: No Stress Concern Present (08/28/2017)   Received from [facility]  Tobacco Use: Low Risk  (11/08/2023)    Readmission Risk Interventions     No data to  display

## 2023-11-12 NOTE — Progress Notes (Signed)
 PHARMACY - ANTICOAGULATION CONSULT NOTE  Pharmacy Consult for heparin Indication: PE  Allergies  Allergen Reactions   Lisinopril Cough    Patient Measurements: Height: 5' 5 (165.1 cm) Weight: 109.8 kg (242 lb 1 oz) IBW/kg (Calculated) : 57 HEPARIN DW (KG): 81.6 Heparin dosing weight: 86 kg  Vital Signs: Temp: 99.8 F (37.7 C) (10/28 0805) Temp Source: Oral (10/28 0805) BP: 160/131 (10/28 0805) Pulse Rate: 80 (10/28 0805)  Labs: Recent Labs    11/10/23 0453 11/10/23 0921 11/10/23 1507 11/11/23 0615 11/12/23 0650  HGB 7.8*  --   --  7.9* 8.3*  HCT 25.4*  --   --  24.9* 26.5*  PLT 143*  --   --  133* 146*  HEPARINUNFRC 0.19*  --  0.31 0.40 0.58  CREATININE 1.98*  --   --  1.80* 1.47*  TROPONINIHS  --  2,087*  --   --   --     Estimated Creatinine Clearance: 42 mL/min (A) (by C-G formula based on SCr of 1.47 mg/dL (H)).   Medical History: Past Medical History:  Diagnosis Date   Arthritis    Atypical ductal hyperplasia of right breast    Breast cancer (HCC) 01/25/2021   Family history of ovarian cancer    Family history of pancreatic cancer    Family history of prostate cancer    Family history of stomach cancer    GERD (gastroesophageal reflux disease)    Hyperlipidemia    Hypertension    Personal history of radiation therapy     Medications: No anticoagulants PTA  Assessment: 3 yoF admitted with cardiogenic shock found to have PE. Pharmacy dosing IV heparin. Pt s/p bilateral catheter directed lysis 10/24.  Heparin level therapeutic, CBC stable.  Goal of Therapy:  Heparin level 0.3-0.7 units/ml Monitor platelets by anticoagulation protocol: Yes   Plan:  -Continue heparin 1000 units/h -Daily heparin level and CBC   Ozell Jamaica, PharmD, BCPS, [facility] Clinical Pharmacist [phone] Please check AMION for all [facility] Pharmacy numbers 11/12/2023

## 2023-11-12 NOTE — Progress Notes (Signed)
 Progress Note   Patient: Vickie Collier FMW:[id number] DOB: 08-Apr-1950 DOA: 11/06/2023     6 DOS: the patient was seen and examined on 11/12/2023   Brief hospital course: 73 year old female with PMHx breast cancer on letrozole , HTN, HLD who presented to [facility] ED on 11/06/2023 with complaints of progressive weakness and shortness of breath for 1 week associated with exertional chest pain.   Notably, she was evaluated in the ED on 11/02/2023 for dizziness and knee pain.  EKG at that time was notable for new T wave inversions compared to previous EKG in 2023. hsTroponins were slightly elevated but downtrending, 46 > 32.  She was recommended to be admitted for observation but elected to go home per ED report.  On arrival this time, she was afebrile, HR 74, BP 122/77, RR 16, SpO2 95% on 2 L [place].  CBC remarkable for hemoglobin 10.8.  CMP showed creatinine 2.06.  Lactic acid elevated to 3.9.  BNP >35,000.  High-sensitivity troponins elevated to 1349 > 1364 > 2175.  UA suspicious for UTI. CXR negative for acute cardiopulmonary disease.  EKG showed NSR, T wave inversions in leads I, II, III, aVF, V1 through V6.  Patient was started on heparin drip for NSTEMI and admitted.  On 10/23, patient had continued rise in troponins to peak of 8901 with rising lactic acid to 3.2, creatinine elevated to 2.36, hypotensive with cool extremities, concerning for cardiogenic shock.  TTE showed LVEF 35-40%, global hypokinesis, mild concentric LVH, flattened interventricular septum consistent with right ventricular pressure and volume overload, severely reduced RV, moderately elevated PASP at 57.5 mmHg, severely dilated RA, severe TR, mild AS, dilated IVC. PCCM was consulted and she was transferred to [facility] 2H and started on milrinone.   On 10/24, she underwent RHC which showed severe PAH with cor pulmonale and normal left-sided filling pressures suspicious for PE.  VQ scan (11/08/2023) concerning for PE with multiple large  segmental perfusion defects in bilaterally and no corresponding CXR abnormalities.  US  LE Doppler showed DVT in left soleal vein.  IR was consulted and she underwent B/L CDT with tPA infusion for 12 hours, and improvement in PA pressures to 31.  On 10/25, Levophed was stopped.  TTE showed LVEF 60-65%, no RWMA, flattened interventricular septum consistent with right ventricular pressure and volume overload, moderately reduced RV severely elevated PASP at 60.4 mmHg, mild to moderate TR.  On 10/26, downgraded from ICU.   Assessment and Plan:   # Massive bilateral PE with cor pulmonale # Acute BiV HF c/b cardiogenic shock secondary to massive PE # LLE DVT - Presented with progressive shortness of breath and weakness with exertional chest pain for 1 week.  - TTE (10/23) - EF 35-40%, global hypokinesis, septal flattening, severely reduced RV, PASP 57.5, severely dilated RA, severe TR, dilated IVC - RHC (10/24) severe PAH with cor pulmonale  - VQ (10/24) positive for b/l PE - IR consulted - status post b/l CDT tPA on 10/24 - TTE (10/25) - EF 60-65%, no RWMA, residual septal flattening, PASP 60.4 mmHg, mild-mod TR - CTA chest (10/27) multiple lobar and segmental PEs in both lungs without central embolism; main PA normal in size. - Discussed with IR Dr. Jennefer re: need for mechanical thrombectomy, recommended against mechanical thrombectomy, continue anticoagulation at this time - Transition from heparin drip to treatment dose Eliquis 10 mg twice daily for 7 days - SpO2 100% on 3L/min [place], will wean as tolerated - Adv HF following - off milrinone  on 10/27. Diuresis per AHF team  # Type 2 MI - Troponins peaked at 8901 - Type 2 MI secondary to right heart strain from acute massive PE  # AKI on CKD stage IIIb - Cr baseline ~ 1.40 - Cr elevated to  max 2.98 in the setting of cardiogenic shock/massive PE - Improved to 1.47 today  # Acute respiratory failure with hypoxia - Secondary to massive b/l  PE w/ cor pulmonale/acute BiV HF - On 3L/min [place] maintaining SpO2 98% - Wean as tolerated  # Atrial Fibrillation - Noted to be in Afib on telemetry this morning, then sinus tach with PVCs - HR 80-130s - Starting Eliquis for PE treatment - Cardiology following  # ? UTI # Low-grade fever - UA on admission c/w UTI - UCx + >100K lactobacillus - likely normal flora/contaminant - BCx NGTD - CT chest with nonspecific findings - bilateral atelectasis and LUL GGO/patchy opacities - Low-grade fever to 100.5 F overnight, no leukocytosis - Will continue Augmentin for 5 days  # Anemia, normocytic - Hgb improved to 8.3, down from 10.8 on admission  - No signs or symptoms of overt bleeding - FOBT pending collection  - Total Fe deficit ~ 1,785 mg - IV Venofer 200 mg x 1 ordered - PO Ferrous sulfate 325 mg daily starting tomorrow  # Breast cancer s/p radiation and right lumpectomy - Currently on letrozole  therapy - Follows with medical oncology, Dr. Odean  # Class II Obesity (BMI Body mass index is 38.81 kg/m. kg/m) - Obesity is clinically significant and contributes to HTN and HLD.       Subjective: Patient seen bedside.  No acute events overnight.  She is in very good spirits today, states she is doing very well.  She is still on 3 L/min [place] satting 100%.  I believe we can wean that down.  Denies any chest pain, worsening shortness of breath, dysuria, abdominal pain, fevers, chills.  Physical Exam: Vitals:   11/12/23 1015 11/12/23 1016 11/12/23 1017 11/12/23 1057  BP:    (!) 127/97  Pulse: 82 84 82   Resp: 15 14 13    Temp:      TempSrc:      SpO2:      Weight:      Height:       Physical Exam Constitutional:      General: She is not in acute distress.    Appearance: She is not ill-appearing.  Neck:     Comments: LIJ CVC in place Cardiovascular:     Rate and Rhythm: Tachycardia present. Rhythm irregularly irregular.     Heart sounds: Normal heart sounds. No murmur  heard. Pulmonary:     Effort: Pulmonary effort is normal. No respiratory distress.     Breath sounds: Normal breath sounds. No wheezing.  Abdominal:     General: There is no distension.     Palpations: Abdomen is soft.     Tenderness: There is no abdominal tenderness.  Musculoskeletal:     Right lower leg: No edema.     Left lower leg: No edema.  Skin:    General: Skin is warm and dry.     Capillary Refill: Capillary refill takes less than 2 seconds.  Neurological:     Mental Status: She is alert and oriented to person, place, and time. Mental status is at baseline.    Family Communication: Discussed with patient's daughter Dierdre at bedside  Disposition: Status is: Inpatient Remains inpatient appropriate because: massive  PE w/ cor pulmonale, AKI, acute hypoxic respiratory failure  Planned Discharge Destination: Pending PT/OT eval    Time spent: 40 minutes  Author: Jeanmarc Viernes Al-Sultani, MD 11/12/2023 11:27 AM  For on call review [email or web address].

## 2023-11-12 NOTE — Progress Notes (Addendum)
 Advanced Heart Failure Rounding Note  Cardiologist: None  Chief Complaint: BiV HF Subjective:    Underwent catheter-directed t-pa on 10/24 Chest CT 10/27 showed lobar and segmental pulmonary emboli in the right upper lobe, left upper lobe, right middle lobe, and right lower lobe, and segmental pulmonary emboli in the left lower lobe; no central pulmonary embolism  Remains on heparin gtt.   UCx with lactobacillus. On augmentin.   Scr continues to improve, 1.8>>1.47   Milrinone discontinued yesterday, co-ox pending. CVP 10   BP elevated, 150s-160s systolic. Runs of PAF on tele    She feels that breathing has improved but c/w O2 requirements via [place]. No pleuritic CP. Main complaint this morning is diffuse joint pain, reports h/o arthritis + fibromyalgia. Worse today.     Objective:   Echo 11/09/23 LVEF 60-65% RV function much improved. Mild-mod residual RV dysfunction with McConnell's sign RVSP   RHC 10/24 on NE 6 and milrinone 0.25:  RA = 13, RV = 59/14, PA = 68/30 (44) , PCW = 9, Fick cardiac output/index = 7.4/3.5, TD CO/CI = 4.5/2.1 (more accurate), PVR = 9.2 WU, Ao sat = 92%, PA sat = 58%, 62%, PAPi = 3.0   Weight Range: 109.8 kg Body mass index is 40.28 kg/m.   Vital Signs:   Temp:  [99.5 F (37.5 C)-100.5 F (38.1 C)] 99.8 F (37.7 C) (10/28 0805) Pulse Rate:  [65-110] 80 (10/28 0805) Resp:  [14-20] 20 (10/28 0805) BP: (156-167)/(73-131) 160/131 (10/28 0805) SpO2:  [96 %-99 %] 97 % (10/28 0805) Weight:  [109.8 kg] 109.8 kg (10/28 0523) Last BM Date : 11/11/23  Weight change: Filed Weights   11/09/23 0500 11/10/23 0500 11/12/23 0523  Weight: 108 kg 105.8 kg 109.8 kg   Intake/Output:  Intake/Output Summary (Last 24 hours) at 11/12/2023 0826 Last data filed at 11/12/2023 0321 Gross per 24 hour  Intake --  Output 1300 ml  Net -1300 ml    Physical Exam   GENERAL: elderly, chronically ill appearing, NAD Lungs- diminished at bases  CARDIAC:   JVP: 10-12 cm       Mildly tachy rate and regular rhythm. No LEE  ABDOMEN: Soft, non-tender, non-distended.  EXTREMITIES: Warm and well perfused.  NEUROLOGIC: No obvious FND   Telemetry   Sinus tach low 100s, runs of PAF, PACs (Personally reviewed)    Labs    CBC Recent Labs    11/11/23 0615 11/12/23 0650  WBC 5.3 5.2  HGB 7.9* 8.3*  HCT 24.9* 26.5*  MCV 82.5 82.3  PLT 133* 146*   Basic Metabolic Panel Recent Labs    89/72/74 0615 11/12/23 0650  NA 138 137  K 4.5 4.5  CL 104 103  CO2 22 23  GLUCOSE 102* 91  BUN 38* 34*  CREATININE 1.80* 1.47*  CALCIUM 8.7* 8.8*  MG 2.1 1.9   Liver Function Tests Recent Labs    11/10/23 0453  AST 32  ALT 15  ALKPHOS 50  BILITOT 0.8  PROT 6.4*  ALBUMIN 2.6*   ProBNP (last 3 results) Recent Labs    11/06/23 1733  PROBNP >35,000.0*   Hemoglobin A1C No results for input(s): HGBA1C in the last 72 hours.  Fasting Lipid Panel No results for input(s): CHOL, HDL, LDLCALC, TRIG, CHOLHDL, LDLDIRECT in the last 72 hours.  Thyroid Function Tests No results for input(s): TSH, T4TOTAL, T3FREE, THYROIDAB in the last 72 hours.  Invalid input(s): FREET3  Medications:    Scheduled Medications:  amoxicillin-clavulanate  1 tablet Oral Q12H   Chlorhexidine  Gluconate Cloth  6 each Topical Daily   ferrous sulfate  325 mg Oral Q breakfast   guaiFENesin  1,200 mg Oral BID   pantoprazole  40 mg Oral Daily   polyethylene glycol  17 g Oral BID   pregabalin  150 mg Oral BID   senna-docusate  2 tablet Oral Daily   sodium chloride  flush  3 mL Intravenous Q12H   sodium chloride  flush  3 mL Intravenous Q12H    Infusions:  heparin 1,000 Units/hr (11/10/23 1700)    PRN Medications: acetaminophen , HYDROmorphone (DILAUDID) injection, midazolam  PF, ondansetron  (ZOFRAN ) IV, ondansetron  (ZOFRAN ) IV, mouth rinse, oxyCODONE -acetaminophen , sodium chloride , sodium chloride  flush, sodium chloride  flush, technetium  albumin aggregated  Patient Profile   Vickie Collier 73 y.o. female with history of HTN, HLD, breast cancer s/p radiation on letrozole , GERD, arthritis, CKD IIIb. Seen for evaluation for cardiogenic shock in the setting of massive PE and  cor pulmonale.   Assessment/Plan  Cardiogenic shock due to massive PE and cor pulmonale - Echo 10/23: EF 35-40%, septal flattening, severely reduced RV, RA severely dilated, severe TR, dilated IVC - LE u/s +DVT - VQ 10/24 + bilateral PE - RHC 10/24 as above - s/p catheter directed t-PA on 10/24 - Echo 10/25 LVEF 60-65% RV function much improved. Mild- mod residual RV dysfunction with McConnell's sign RVSP 60mmHG  - CTA chest (10/27) multiple lobar and segmental PEs in both lungs without central embolism; main PA normal in size. Per IR would not be amendable to mechanical thrombectomy. Continue a/c - on heparin gtt, timing of transition to DOAC per IM  - Milrinone discontinued yesterday. Co-ox pending  - CVP 10. Start PO Lasix 40 mg daily   Elevated trop - due to massive PE   AKI on CKD IIIb due to ATN/PE - sCr baseline around 1.3 - Cr 2.06>2.98>2.77 -> 2.22>1.98>1.8>1.5 today    UTI - Primary team gave ceftriaxone x1 10/23 - Urine culture with lactobacillus; BCX ngtd (x4 days) - Treating w/ augmentin x5 days.    Anemia - Hgb 10.8>9>8.8 -> 8.4 -> 7.8 >7.9>>8.3  - Trend closely, watch for bleeding - FOBT ordered  - tsat 6; treated w/ IV Fe  Atypical atrial flutter - noted on tele, in NSR this am - a/c per above    Length of Stay: [address] PA-C  11/12/2023, 8:26 AM  Advanced Heart Failure Team Pager [phone] (M-F; 7a - 5p)  Please contact [facility] for night-coverage after hours (5p -7a ) and weekends on [email or web address]  Patient seen with PA, I formulated the plan and agree with the above note.   Breathing better but still on 3L [place].  CTA chest without central PE so no thrombectomy planned.  She remains on IV heparin.    She is in NSR with PVCs and PACs, she did appear to have a run of atypical atrial flutter yesterday.    General: NAD Neck: JVP 10 cm, no thyromegaly or thyroid nodule.  Lungs: Clear to auscultation bilaterally with normal respiratory effort. CV: Nondisplaced PMI.  Heart regular S1/S2, no S3/S4, no murmur. Trace ankle edema.  Abdomen: Soft, nontender, no hepatosplenomegaly, no distention.  Skin: Intact without lesions or rashes.  Neurologic: Alert and oriented x 3.  Psych: Normal affect. Extremities: No clubbing or cyanosis.  HEENT: Normal.   Mild volume overload by exam, CVP line not functioning properly though measured around 10 earlier.  - Lasix  60 mg IV x 1 and follow response.  Will be relatively gentle with RV failure.   Continue heparin gtt for PE, no central PE on CTA chest yesterday so no thrombectomy planned. Will need to transition eventually to apixaban.   She had a run of atypical flutter yesterday.  She is in NSR currently.  She will be anticoagulated for PE regardless.   Ezra Shuck 11/12/2023 10:44 AM

## 2023-11-12 NOTE — NC FL2 (Signed)
 [place]  MEDICAID [id number] LEVEL OF CARE FORM     IDENTIFICATION  Patient Name: Vickie Collier Birthdate: October 30, 1950 Sex: female Admission Date (Current Location): 11/06/2023  [facility] and [place] Number:  Producer, Television/film/video and Address:  [facility] [place], [address], [place], [place] [address]      Provider Number: [id number]  Attending Physician Name and Address:  Mosie Ford, MD  Relative Name and Phone Number:  Younan,Deirdre Daughter   [phone]    Current Level of Care: Hospital Recommended Level of Care: Skilled Nursing Facility Prior Approval Number:    Date Approved/Denied:   PASRR Number: PENDING  Discharge Plan: SNF    Current Diagnoses: Patient Active Problem List   Diagnosis Date Noted   NSTEMI (non-ST elevated myocardial infarction) (HCC) 11/07/2023   ACS (acute coronary syndrome) (HCC) 11/06/2023   Genetic testing 04/03/2021   Family history of prostate cancer 03/22/2021   Family history of ovarian cancer 03/22/2021   Family history of pancreatic cancer 03/22/2021   Family history of stomach cancer 03/22/2021   Malignant neoplasm of upper-outer quadrant of right breast in female, estrogen receptor positive (HCC) 02/16/2021    Orientation RESPIRATION BLADDER Height & Weight     Self, Time, Situation, Place  O2 Continent Weight: 242 lb 1 oz (109.8 kg) Height:  5' 5 (165.1 cm)  BEHAVIORAL SYMPTOMS/MOOD NEUROLOGICAL BOWEL NUTRITION STATUS      Incontinent Diet (See dc summary)  AMBULATORY STATUS COMMUNICATION OF NEEDS Skin   Limited Assist Verbally Normal                       Personal Care Assistance Level of Assistance  Bathing, Feeding, Dressing Bathing Assistance: Limited assistance Feeding assistance: Limited assistance Dressing Assistance: Limited assistance     Functional Limitations Info  Sight, Hearing, Speech Sight Info: Adequate Hearing Info: Adequate Speech Info: Adequate    SPECIAL CARE  FACTORS FREQUENCY  PT (By licensed PT), OT (By licensed OT)     PT Frequency: 5x weekly OT Frequency: 5x weekly            Contractures      Additional Factors Info  Code Status, Allergies Code Status Info: Full Allergies Info: Lisinopril           Current Medications (11/12/2023):  This is the current hospital active medication list Current Facility-Administered Medications  Medication Dose Route Frequency Provider Last Rate Last Admin   acetaminophen  (TYLENOL ) tablet 650 mg  650 mg Oral Q6H PRN Sharie Bourbon, MD   650 mg at 11/11/23 0643   amLODipine (NORVASC) tablet 5 mg  5 mg Oral Daily Marcine Catalan M, PA-C       amoxicillin-clavulanate (AUGMENTIN) 875-125 MG per tablet 1 tablet  1 tablet Oral Q12H McLean, Dalton S, MD   1 tablet at 11/11/23 2155   Chlorhexidine  Gluconate Cloth 2 % PADS 6 each  6 each Topical Daily Bensimhon, Toribio SAUNDERS, MD   6 each at 11/11/23 0950   ferrous sulfate tablet 325 mg  325 mg Oral Q breakfast Al-Sultani, Anmar, MD       guaiFENesin (MUCINEX) 12 hr tablet 1,200 mg  1,200 mg Oral BID Sharie Bourbon, MD   1,200 mg at 11/11/23 2155   heparin ADULT infusion 100 units/mL (25000 units/250mL)  1,000 Units/hr Intravenous Continuous Cyndy Ozell DASEN, RPH 10 mL/hr at 11/10/23 1700 1,000 Units/hr at 11/10/23 1700   HYDROmorphone (DILAUDID) injection 0.5 mg  0.5 mg  Intravenous Q6H PRN Sharie Bourbon, MD   0.5 mg at 11/09/23 9182   magnesium sulfate IVPB 2 g 50 mL  2 g Intravenous Once Marcine, Brittainy M, PA-C       midazolam  PF (VERSED ) injection 1 mg  1 mg Intravenous Q1H PRN Suttle, Dylan J, MD       ondansetron  (ZOFRAN ) injection 4 mg  4 mg Intravenous Q6H PRN Bensimhon, Toribio SAUNDERS, MD       ondansetron  (ZOFRAN ) injection 4 mg  4 mg Intravenous Q6H PRN Suttle, Dylan J, MD       Oral care mouth rinse  15 mL Mouth Rinse PRN Sharie Bourbon, MD       oxyCODONE -acetaminophen  (PERCOCET/ROXICET) 5-325 MG per tablet 1 tablet  1 tablet Oral  Q6H PRN Sharie Bourbon, MD   1 tablet at 11/12/23 0836   pantoprazole (PROTONIX) EC tablet 40 mg  40 mg Oral Daily Bensimhon, Daniel R, MD   40 mg at 11/11/23 1147   polyethylene glycol (MIRALAX / GLYCOLAX) packet 17 g  17 g Oral BID McLean, Dalton S, MD   17 g at 11/11/23 1147   pregabalin (LYRICA) capsule 150 mg  150 mg Oral BID Bensimhon, Daniel R, MD   150 mg at 11/11/23 2155   senna-docusate (Senokot-S) tablet 2 tablet  2 tablet Oral Daily Bensimhon, Daniel R, MD   2 tablet at 11/11/23 1147   sodium chloride  (OCEAN) 0.65 % nasal spray 1 spray  1 spray Each Nare PRN Sharie Bourbon, MD       sodium chloride  flush (NS) 0.9 % injection 3 mL  3 mL Intravenous Q12H Bensimhon, Toribio SAUNDERS, MD   3 mL at 11/11/23 1148   sodium chloride  flush (NS) 0.9 % injection 3 mL  3 mL Intravenous PRN Bensimhon, Toribio SAUNDERS, MD       sodium chloride  flush (NS) 0.9 % injection 3 mL  3 mL Intravenous Q12H Suttle, Dylan J, MD   3 mL at 11/11/23 1148   sodium chloride  flush (NS) 0.9 % injection 3 mL  3 mL Intravenous PRN Suttle, Dylan J, MD       technetium albumin aggregated (MAA) injection solution 4 millicurie  4 millicurie Intravenous Once PRN Bensimhon, Toribio SAUNDERS, MD         Discharge Medications: Please see discharge summary for a list of discharge medications.  Relevant Imaging Results:  Relevant Lab Results:   Additional Information [id number]  Arlana JINNY Moats, LCSWA

## 2023-11-13 DIAGNOSIS — D509 Iron deficiency anemia, unspecified: Secondary | ICD-10-CM

## 2023-11-13 DIAGNOSIS — N390 Urinary tract infection, site not specified: Secondary | ICD-10-CM

## 2023-11-13 DIAGNOSIS — I5021 Acute systolic (congestive) heart failure: Secondary | ICD-10-CM | POA: Diagnosis not present

## 2023-11-13 DIAGNOSIS — C50411 Malignant neoplasm of upper-outer quadrant of right female breast: Secondary | ICD-10-CM

## 2023-11-13 DIAGNOSIS — I480 Paroxysmal atrial fibrillation: Secondary | ICD-10-CM

## 2023-11-13 DIAGNOSIS — I10 Essential (primary) hypertension: Secondary | ICD-10-CM

## 2023-11-13 DIAGNOSIS — Z170 Estrogen receptor positive status [ER+]: Secondary | ICD-10-CM

## 2023-11-13 DIAGNOSIS — I2609 Other pulmonary embolism with acute cor pulmonale: Secondary | ICD-10-CM

## 2023-11-13 DIAGNOSIS — E66812 Obesity, class 2: Secondary | ICD-10-CM

## 2023-11-13 DIAGNOSIS — N1832 Chronic kidney disease, stage 3b: Secondary | ICD-10-CM

## 2023-11-13 DIAGNOSIS — E785 Hyperlipidemia, unspecified: Secondary | ICD-10-CM

## 2023-11-13 DIAGNOSIS — I249 Acute ischemic heart disease, unspecified: Secondary | ICD-10-CM | POA: Diagnosis not present

## 2023-11-13 DIAGNOSIS — I2699 Other pulmonary embolism without acute cor pulmonale: Secondary | ICD-10-CM

## 2023-11-13 DIAGNOSIS — E1169 Type 2 diabetes mellitus with other specified complication: Secondary | ICD-10-CM

## 2023-11-13 LAB — CBC
HCT: 29 % — ABNORMAL LOW (ref 36.0–46.0)
Hemoglobin: 9 g/dL — ABNORMAL LOW (ref 12.0–15.0)
MCH: 25.6 pg — ABNORMAL LOW (ref 26.0–34.0)
MCHC: 31 g/dL (ref 30.0–36.0)
MCV: 82.6 fL (ref 80.0–100.0)
Platelets: 180 K/uL (ref 150–400)
RBC: 3.51 MIL/uL — ABNORMAL LOW (ref 3.87–5.11)
RDW: 17.8 % — ABNORMAL HIGH (ref 11.5–15.5)
WBC: 5.3 K/uL (ref 4.0–10.5)
nRBC: 0.4 % — ABNORMAL HIGH (ref 0.0–0.2)

## 2023-11-13 LAB — COOXEMETRY PANEL
Carboxyhemoglobin: <0.3 % — ABNORMAL LOW (ref 0.5–1.5)
Methemoglobin: <0.7 % (ref 0.0–1.5)
O2 Saturation: 69.5 %
Total hemoglobin: 9.2 g/dL — ABNORMAL LOW (ref 12.0–16.0)

## 2023-11-13 LAB — BASIC METABOLIC PANEL WITH GFR
Anion gap: 10 (ref 5–15)
BUN: 35 mg/dL — ABNORMAL HIGH (ref 8–23)
CO2: 26 mmol/L (ref 22–32)
Calcium: 9.2 mg/dL (ref 8.9–10.3)
Chloride: 101 mmol/L (ref 98–111)
Creatinine, Ser: 1.4 mg/dL — ABNORMAL HIGH (ref 0.44–1.00)
GFR, Estimated: 40 mL/min — ABNORMAL LOW (ref >60)
Glucose, Bld: 81 mg/dL (ref 70–99)
Potassium: 4.7 mmol/L (ref 3.5–5.1)
Sodium: 137 mmol/L (ref 135–145)

## 2023-11-13 MED ORDER — TORSEMIDE 20 MG PO TABS
20.0000 mg | ORAL_TABLET | Freq: Every day | ORAL | Status: DC
Start: 2023-11-13 — End: 2023-11-15
  Administered 2023-11-13 – 2023-11-14 (×2): 20 mg via ORAL
  Filled 2023-11-13 (×2): qty 1

## 2023-11-13 NOTE — Assessment & Plan Note (Signed)
 Plan to complete 5 days of Augmentin

## 2023-11-13 NOTE — Assessment & Plan Note (Signed)
 SP IV iron infusion Follow up hgb is 9,9 with plt 221

## 2023-11-13 NOTE — Assessment & Plan Note (Addendum)
 Massive pulmonary embolism complicated with shock, now resolved.  Sp bilateral catheter directed pulmonary artery thrombolysis with good toleration.   Systolic blood pressure range 879 mmHg.  Plan to continue anticoagulation with apixaban.   Acute hypoxemic respiratory failure, improving, today with 02 saturation 97% on 1 L/min per [place]   US  doppler lower extremities with positive left leg DVT  Acute intramuscular thrombosis involving the left soleal veins.

## 2023-11-13 NOTE — Plan of Care (Signed)
  Problem: Education: Goal: Understanding of cardiac disease, CV risk reduction, and recovery process will improve Outcome: Progressing   Problem: Activity: Goal: Ability to tolerate increased activity will improve Outcome: Progressing   Problem: Cardiac: Goal: Ability to achieve and maintain adequate cardiovascular perfusion will improve Outcome: Progressing   Problem: Health Behavior/Discharge Planning: Goal: Ability to safely manage health-related needs after discharge will improve Outcome: Progressing   Problem: Activity: Goal: Ability to return to baseline activity level will improve Outcome: Progressing   Problem: Cardiovascular: Goal: Ability to achieve and maintain adequate cardiovascular perfusion will improve Outcome: Progressing Goal: Vascular access site(s) Level 0-1 will be maintained Outcome: Progressing   Problem: Health Behavior/Discharge Planning: Goal: Ability to safely manage health-related needs after discharge will improve Outcome: Progressing

## 2023-11-13 NOTE — TOC Progression Note (Addendum)
 Transition of Care [facility]) - Progression Note    Patient Details  Name: Vickie Collier MRN: [id number] Date of Birth: [birth year]  [facility] CM/SW Contact  Arlana JINNY Nicholaus [place] Phone Number: [phone] 11/13/2023, 11:40 AM  Clinical Narrative:   HF CSW received a call from patients daughter who stated that she was told by medical staff her mother may qualify for CIR. Patients daughter stated that she can support her mother 24 hours post dc from CIR. CSW will notify CIR team.   11:53 AM- HF CSW called and updated the patients daughter, she does not qualify for CIR.   HF CSW/CM will continue to follow and monitor for dc readiness.     Expected Discharge Plan: IP Rehab Facility Barriers to Discharge: Continued Medical Work up               Expected Discharge Plan and Services   Discharge Planning Services: CM Consult Post Acute Care Choice: IP Rehab Living arrangements for the past 2 months: Single Family Home                                       Social Drivers of Health (SDOH) Interventions SDOH Screenings   Food Insecurity: Patient Unable To Answer (11/08/2023)  Housing: Unknown (11/08/2023)  Transportation Needs: Patient Unable To Answer (11/08/2023)  Utilities: Patient Unable To Answer (11/08/2023)  Financial Resource Strain: Low Risk  (02/10/2019)   Received from [facility]  Physical Activity: Insufficiently Active (08/28/2017)   Received from [facility]  Social Connections: Patient Unable To Answer (11/08/2023)  Stress: No Stress Concern Present (08/28/2017)   Received from [facility]  Tobacco Use: Low Risk  (11/08/2023)    Readmission Risk Interventions     No data to display

## 2023-11-13 NOTE — Progress Notes (Signed)
 Physical Therapy Treatment Patient Details Name: Vickie Collier MRN: [id number] DOB: Oct 04, 1950 Today's Date: 11/13/2023   History of Present Illness Pt is a 73 y/o F admitted 11/06/23 due to generalized weakness ongoing for the past week. Dx with cardiogenic shock, acute PE, AKI. Now s/p bilateral catheter directed tPA thrombolysis-on 10/24 for intermediate high risk PE, sheath removed on a.m. 10/25. Of note: 10/18 ED w dizziness, new EKG changes admission recommended but pt refused PRN includes HTN, HLD, breast cancer s/p radiation on letrozole , GERD, obesity, arthritis, CKD IIIb.    PT Comments  Mobility very limited due to pain. +2 max assist rolling R/L using bed rails. +2 total assist supine to sit. Unable to fully reach EOB sitting due to pt's heavy push posteriorly (in response to pain). Pt with little to no ROM bilat knees/hips. Pt supine with HOB at 30 degrees at end of session.    If plan is discharge home, recommend the following: Two people to help with walking and/or transfers;Two people to help with bathing/dressing/bathroom   Can travel by private vehicle     No  Equipment Recommendations  Wheelchair cushion (measurements PT);Wheelchair (measurements PT);Hospital bed;Hoyer lift (bariatric equipment)    Recommendations for Other Services       Precautions / Restrictions Precautions Precautions: Fall;Other (comment) Recall of Precautions/Restrictions: Intact Precaution/Restrictions Comments: little to no ROM bilat knees/hips     Mobility  Bed Mobility Overal bed mobility: Needs Assistance Bed Mobility: Rolling, Supine to Sit, Sit to Supine Rolling: Max assist, +2 for physical assistance, Used rails, Total assist   Supine to sit: +2 for physical assistance, Total assist Sit to supine: Total assist, +2 for physical assistance   General bed mobility comments: Attempted transition to EOB utilizing helicopter method. Pt very resistive due to pain response. Heavy  posterior push requiring return to supine. Rolling R/L for bed linen change. Rolls best to R.    Transfers                   General transfer comment: unable. Bed placed in semi chair position (HOB at 30 degrees)    Ambulation/Gait               General Gait Details: unable   Stairs             Wheelchair Mobility     Tilt Bed    Modified Rankin (Stroke Patients Only)       Balance Overall balance assessment: History of Falls, Needs assistance   Sitting balance-Leahy Scale: Zero Sitting balance - Comments: heavy posterior lean/push Postural control: Posterior lean                                  Communication Communication Communication: No apparent difficulties  Cognition Arousal: Alert Behavior During Therapy: WFL for tasks assessed/performed, Anxious   PT - Cognitive impairments: No apparent impairments                         Following commands: Intact      Cueing Cueing Techniques: Verbal cues  Exercises      General Comments General comments (skin integrity, edema, etc.): VSS on 1L      Pertinent Vitals/Pain Pain Assessment Pain Assessment: 0-10 Pain Score: 10-Worst pain ever Pain Location: BLE with touch or movement Pain Descriptors / Indicators: Guarding, Grimacing, Moaning, Tender, Sore Pain Intervention(s): Monitored during  session, Repositioned, Limited activity within patient's tolerance    Home Living                          Prior Function            PT Goals (current goals can now be found in the care plan section) Acute Rehab PT Goals Patient Stated Goal: decrease pain Progress towards PT goals: Not progressing toward goals - comment (pain)    Frequency    Min 1X/week      PT Plan      Co-evaluation   Reason for Co-Treatment: For patient/therapist safety;To address functional/ADL transfers;Other (comment) PT goals addressed during session:  Balance;Mobility/safety with mobility;Strengthening/ROM        AM-PAC PT 6 Clicks Mobility   Outcome Measure  Help needed turning from your back to your side while in a flat bed without using bedrails?: Total Help needed moving from lying on your back to sitting on the side of a flat bed without using bedrails?: Total Help needed moving to and from a bed to a chair (including a wheelchair)?: Total Help needed standing up from a chair using your arms (e.g., wheelchair or bedside chair)?: Total Help needed to walk in hospital room?: Total Help needed climbing 3-5 steps with a railing? : Total 6 Click Score: 6    End of Session Equipment Utilized During Treatment: Oxygen Activity Tolerance: Patient limited by pain Patient left: in bed;with call bell/phone within reach;with family/visitor present Nurse Communication: Mobility status PT Visit Diagnosis: Other abnormalities of gait and mobility (R26.89);Muscle weakness (generalized) (M62.81);Pain Pain - part of body: Hip;Knee     Time: 8870-8846 PT Time Calculation (min) (ACUTE ONLY): 24 min  Charges:    $Therapeutic Activity: 8-22 mins PT General Charges $$ ACUTE PT VISIT: 1 Visit                     Sari MATSU., PT  Office # [phone]    Erven Sari Shaker 11/13/2023, 12:56 PM

## 2023-11-13 NOTE — Assessment & Plan Note (Signed)
 Patient converted back on sinus rhythm Continue anticoagulation with apixaban.

## 2023-11-13 NOTE — Assessment & Plan Note (Signed)
 Patient was placed on insulin sliding scale during her hospitalization for glucose cover and monitoring Her discharge fasting glucose is 104 mg.dl  Continue with statin

## 2023-11-13 NOTE — Assessment & Plan Note (Signed)
 Calculated BMI is 39.4

## 2023-11-13 NOTE — Assessment & Plan Note (Signed)
Continue with amlodipine

## 2023-11-13 NOTE — Assessment & Plan Note (Signed)
 Renal function with serum cr at 1,40 with K at 4,7 and serum bicarbonate at 26  Na 137 and Mg 1.9   Plan to continue diuresis with torsemide 20 mg po daily Follow up renal function and electrolytes in am.  Avoid hypotension and nephrotoxic medications.

## 2023-11-13 NOTE — Assessment & Plan Note (Addendum)
 10/24 cardiac catheterization  RA 13.  RV 59/14 PA 68/30 mean 44 PCWP 9 Cardiac output 7.4 and index 3.5 (Fick) Cardiac output 4.5 and index 2.1 (thermodilution) PVR 9.2 WU  10/25 Recovered LV systolic function with repeat echocardiogram with LV EF 60 to 65%, interventricular septum flattened in systole and diastole, RV systolic function with moderate reduction, RVSP 60,4 mmHg.  Mild to moderate tricuspid valve regurgitation   RV failure.  Acute on chronic cor pulmonale.   Urine output is 1,600 ml   Systolic blood pressure 110 mmHg range.  SV02 72.2   Plan to continue with torsemide 20 mg po daily.  Possible addition of spironolactone if renal function more stable.

## 2023-11-13 NOTE — Progress Notes (Signed)
 Occupational Therapy Treatment Patient Details Name: Vickie Collier MRN: [id number] DOB: 07-24-1950 Today's Date: 11/13/2023   History of present illness Pt is a 73 y/o F admitted 11/06/23 due to generalized weakness ongoing for the past week. Dx with cardiogenic shock, acute PE, AKI. Now s/p bilateral catheter directed tPA thrombolysis-on 10/24 for intermediate high risk PE, sheath removed on a.m. 10/25. Of note: 10/18 ED w dizziness, new EKG changes admission recommended but pt refused PRN includes HTN, HLD, breast cancer s/p radiation on letrozole , GERD, obesity, arthritis, CKD IIIb.   OT comments  Treatment session limited d/t BLE pain. Attempted to come EOB, pt resisting with posterior lean d/t pain from touch to BLEs. Forced to return to supine, completed perineal hygiene at bed level. Pt max to total assist  +2 for all bed mobility. Continue to recommend <3 hours of skilled rehab daily to optimize independence levels. Will continue to follow acutely.       If plan is discharge home, recommend the following:  Two people to help with walking and/or transfers;Two people to help with bathing/dressing/bathroom;Assist for transportation;Help with stairs or ramp for entrance   Equipment Recommendations  Other (comment) (Defer to next venue)       Precautions / Restrictions Precautions Precautions: Fall;Other (comment) Recall of Precautions/Restrictions: Intact Precaution/Restrictions Comments: little to no ROM bilat knees/hips Restrictions Weight Bearing Restrictions Per Provider Order: No       Mobility Bed Mobility Overal bed mobility: Needs Assistance Bed Mobility: Rolling, Supine to Sit, Sit to Supine Rolling: Max assist, +2 for physical assistance, Used rails, Total assist   Supine to sit: +2 for physical assistance, Total assist Sit to supine: Total assist, +2 for physical assistance   General bed mobility comments: Attemptedto come EOB, pt limited d/t BLE pain with  touch.Heavy posterior lean, unable to assist forced to return to supine. Rolling R/L for bed linen change. Rolled better to R side, total to roll to L       Balance Overall balance assessment: History of Falls, Needs assistance   Sitting balance-Leahy Scale: Zero Sitting balance - Comments: heavy posterior lean/push Postural control: Posterior lean       ADL either performed or assessed with clinical judgement   ADL Overall ADL's : Needs assistance/impaired     Toileting- Clothing Manipulation and Hygiene: Total assistance;+2 for physical assistance;+2 for safety/equipment;Bed level Toileting - Clothing Manipulation Details (indicate cue type and reason): +3 for rolling       General ADL Comments: pt limited by BLE pain, unable to even come EOB    Extremity/Trunk Assessment Upper Extremity Assessment Upper Extremity Assessment: Generalized weakness   Lower Extremity Assessment Lower Extremity Assessment: Defer to PT evaluation        Vision   Vision Assessment?: No apparent visual deficits         Communication Communication Communication: No apparent difficulties   Cognition Arousal: Alert Behavior During Therapy: WFL for tasks assessed/performed, Anxious Cognition: No apparent impairments       Following commands: Intact        Cueing   Cueing Techniques: Verbal cues        General Comments VSS on 1L    Pertinent Vitals/ Pain       Pain Assessment Pain Assessment: 0-10 Pain Score: 10-Worst pain ever Pain Location: BLE with touch or movement Pain Descriptors / Indicators: Guarding, Grimacing, Moaning, Tender, Sore Pain Intervention(s): Limited activity within patient's tolerance    Frequency  Min 2X/week  Progress Toward Goals  OT Goals(current goals can now be found in the care plan section)  Progress towards OT goals: Progressing toward goals  Acute Rehab OT Goals Patient Stated Goal: To rest OT Goal Formulation: With  patient/family Time For Goal Achievement: 11/25/23 Potential to Achieve Goals: Fair ADL Goals Pt Will Perform Grooming: with set-up;sitting Pt Will Perform Upper Body Dressing: with min assist;sitting Pt Will Perform Lower Body Dressing: with max assist;sit to/from stand Pt Will Transfer to Toilet: with max assist;stand pivot transfer;bedside commode Pt Will Perform Toileting - Clothing Manipulation and hygiene: with max assist;sit to/from stand Additional ADL Goal #1: Pt will maintain seated balance with min guard for 5 min as precursor to participating in ADL  Plan      Co-evaluation    PT/OT/SLP Co-Evaluation/Treatment: Yes Reason for Co-Treatment: For patient/therapist safety;To address functional/ADL transfers;Other (comment) PT goals addressed during session: Balance;Mobility/safety with mobility;Strengthening/ROM OT goals addressed during session: ADL's and self-care;Strengthening/ROM      AM-PAC OT 6 Clicks Daily Activity     Outcome Measure   Help from another person eating meals?: None Help from another person taking care of personal grooming?: A Little Help from another person toileting, which includes using toliet, bedpan, or urinal?: Total Help from another person bathing (including washing, rinsing, drying)?: A Lot Help from another person to put on and taking off regular upper body clothing?: A Lot Help from another person to put on and taking off regular lower body clothing?: Total 6 Click Score: 13    End of Session Equipment Utilized During Treatment: Oxygen  OT Visit Diagnosis: Unsteadiness on feet (R26.81);Other abnormalities of gait and mobility (R26.89);Muscle weakness (generalized) (M62.81);History of falling (Z91.81);Pain Pain - Right/Left:  (bil) Pain - part of body: Knee;Hip   Activity Tolerance Patient limited by pain   Patient Left in bed;with call bell/phone within reach;with bed alarm set;with family/visitor present   Nurse Communication  Mobility status;Need for lift equipment;Precautions        Time: 1131-1153 OT Time Calculation (min): 22 min  Charges: OT General Charges $OT Visit: 1 Visit OT Treatments $Self Care/Home Management : 8-22 mins  Adrianne BROCKS, OT  Acute Rehabilitation Services Office [phone] Secure chat preferred   Adrianne GORMAN Savers 11/13/2023, 1:43 PM

## 2023-11-13 NOTE — Progress Notes (Addendum)
 Progress Note   Patient: Vickie Collier FMW:[id number] DOB: 1950/07/27 DOA: 11/06/2023     7 DOS: the patient was seen and examined on 11/13/2023   Brief hospital course: Mrs. Pong was admitted to the hospital with the diagnosis of acute pulmonary embolism, complicated with cardiogenic shock.   73 year old female with PMHx breast cancer, HTN, HLD who presented to [facility] ED on 11/06/2023 with complaints of progressive weakness and shortness of breath for 1 week associated with exertional chest pain.   On her initial physical examination she was afebrile, HR 74, BP 122/77, RR 16, SpO2 95% on 2 L [place].  CBC remarkable for hemoglobin 10.8.  CMP showed creatinine 2.06.  Lactic acid elevated to 3.9.  BNP >35,000.  High-sensitivity troponins elevated to 1349 > 1364 > 2175.  UA suspicious for UTI. CXR negative for acute cardiopulmonary disease.  EKG showed NSR, T wave inversions in leads I, II, III, aVF, V1 through V6.   Patient was started on heparin drip for NSTEMI and admitted.  On 10/23, patient had continued rise in troponins to peak of 8901 with rising lactic acid to 3.2, creatinine elevated to 2.36, hypotensive with cool extremities, concerning for cardiogenic shock.   TTE showed LVEF 35-40%, with signs of RV failure.   10/23 PCCM was consulted and she was transferred to [facility] 2H and started on milrinone.  10/24, she underwent RHC which showed severe PAH with cor pulmonale and normal left-sided filling pressures suspicious for PE.   VQ scan concerning for PE with multiple large segmental perfusion defects in bilaterally and no corresponding CXR abnormalities.   US  LE Doppler showed DVT in left soleal vein.   IR was consulted and she underwent B/L CDT with tPA infusion for 12 hours, and improvement in pulmonary pressures.  10/25, Levophed was stopped.  TTE showed LVEF 60-65%,  10/26 transfer to [facility].  10/28 transitioned to apixaban with good toleration.  10/29 transitioned to po torsemide.     Assessment and Plan: * Acute pulmonary embolism (HCC) Massive pulmonary embolism complicated with shock, now resolved.  Sp bilateral catheter directed pulmonary artery thrombolysis with good toleration.   Systolic blood pressure range 879 mmHg.  Plan to continue anticoagulation with apixaban.   Acute hypoxemic respiratory failure, improving, today with 02 saturation 97% on 1 L/min per [place]   US  doppler lower extremities with positive left leg DVT  Acute intramuscular thrombosis involving the left soleal veins.   Acute clinical systolic heart failure (HCC) 10/24 cardiac catheterization  RA 13.  RV 59/14 PA 68/30 mean 44 PCWP 9 Cardiac output 7.4 and index 3.5 (Fick) Cardiac output 4.5 and index 2.1 (thermodilution) PVR 9.2 WU  10/25 Recovered LV systolic function with repeat echocardiogram with LV EF 60 to 65%, interventricular septum flattened in systole and diastole, RV systolic function with moderate reduction, RVSP 60,4 mmHg.  Mild to moderate tricuspid valve regurgitation   RV failure.  Acute on chronic cor pulmonale.   Urine output is -3.760  Systolic blood pressure 120 mmHg range.  SV02 69.5   Plan to continue with torsemide 20 mg po daily.  Possible addition of spironolactone.   Paroxysmal atrial fibrillation (HCC) Patient back on sinus rhythm Continue anticoagulation with apixaban. Continue telemetry monitoring   Essential hypertension Continue with amlodipine.   Chronic kidney disease, stage 3b (HCC) Renal function with serum cr at 1,40 with K at 4,7 and serum bicarbonate at 26  Na 137 and Mg 1.9   Plan to continue diuresis  with torsemide 20 mg po daily Follow up renal function and electrolytes in am.  Avoid hypotension and nephrotoxic medications.   Type 2 diabetes mellitus with hyperlipidemia (HCC) Continue glucose cover and monitoring with insulin sliding scale Fasting glucose today 81 mg/dl  Resume statin   Iron deficiency anemia SP IV iron  infusion Follow up hgb is 9,0 with plt 180  Continue close follow up on cell counts.   UTI (urinary tract infection) Plan to complete 5 days of Augmentin   Obesity, class 2 Calculated BMI is 39.4   Malignant neoplasm of upper-outer quadrant of right breast in female, estrogen receptor positive (HCC) Follow up as outpatient.       Subjective: Patient feeling fatigue and tired, no chest pain and no dyspnea. Very weak and deconditioned   Physical Exam: Vitals:   11/13/23 0200 11/13/23 0324 11/13/23 0440 11/13/23 0815  BP:  (!) 145/87  (!) 116/95  Pulse: 73 71  87  Resp: 10 19  17   Temp:  98.9 F (37.2 C)  99.3 F (37.4 C)  TempSrc:  Oral  Oral  SpO2: 99% 97%  96%  Weight:   108.6 kg   Height:       Neurology awake and alert, deconditioned.  ENT with mild pallor Cardiovascular with S1 and S2 present and regular with no gallops, rubs or murmurs No JVD Respiratory with poor inspiratory effort with no wheezing or rhonchi, on anterior auscultation  Abdomen with no distention, soft and non tender Lower extremity with no ankle edema No rashes  Left internal jugular vein central venous catheter in place   Data Reviewed:   Family Communication: I spoke with patient's daughter at the bedside, we talked in detail about patient's condition, plan of care and prognosis and all questions were addressed.   Disposition: Status is: Inpatient Remains inpatient appropriate because: recovering from cardiogenic shock   Planned Discharge Destination: Skilled nursing facility     Author: Elidia Toribio Furnace, MD 11/13/2023 10:37 AM  For on call review [email or web address].

## 2023-11-13 NOTE — Assessment & Plan Note (Signed)
 Follow up as outpatient

## 2023-11-13 NOTE — Progress Notes (Signed)
 Inpatient Rehab Admissions Coordinator:   Per [facility] request (family asked),  patient was screened for CIR candidacy by Leita Kleine, MS, CCC-SLP.   At this time, Pt. Is not demonstrating ability to tolerate 3 hours of daily therapy. Additionally her payor will not approve AIR for her diagnoses. I will not pursue a rehab consult for this Pt. Please contact me with any questions.   Leita Kleine, MS, CCC-SLP Rehab Admissions Coordinator  [phone] (celll) [phone] (office)

## 2023-11-13 NOTE — Progress Notes (Addendum)
 Advanced Heart Failure Rounding Note  Cardiologist: None  Chief Complaint: BiV HF Subjective:    Underwent catheter-directed t-pa on 10/24 Chest CT 10/27 showed lobar and segmental pulmonary emboli in the right upper lobe, left upper lobe, right middle lobe, and right lower lobe, and segmental pulmonary emboli in the left lower lobe; no central pulmonary embolism  Now on Eliquis   Co-ox stable off milrinone at 70% 2.5L in UOP yesterday w/ IV Lasix. Wt down 3 lb. CVP 7   Scr continues to improve, 1.8>>1.47>>1.40    UCx with lactobacillus. On augmentin.   Maintaining NSR   Breathing overall improved, but feels weak. Has been sleeping a lot.    Objective:   Echo 11/09/23 LVEF 60-65% RV function much improved. Mild-mod residual RV dysfunction with McConnell's sign RVSP   RHC 10/24 on NE 6 and milrinone 0.25:  RA = 13, RV = 59/14, PA = 68/30 (44) , PCW = 9, Fick cardiac output/index = 7.4/3.5, TD CO/CI = 4.5/2.1 (more accurate), PVR = 9.2 WU, Ao sat = 92%, PA sat = 58%, 62%, PAPi = 3.0   Weight Range: 108.6 kg Body mass index is 39.84 kg/m.   Vital Signs:   Temp:  [98.8 F (37.1 C)-99.4 F (37.4 C)] 99.3 F (37.4 C) (10/29 0815) Pulse Rate:  [68-95] 87 (10/29 0815) Resp:  [10-19] 17 (10/29 0815) BP: (115-158)/(83-99) 116/95 (10/29 0815) SpO2:  [96 %-100 %] 96 % (10/29 0815) Weight:  [108.6 kg] 108.6 kg (10/29 0440) Last BM Date : 11/11/23  Weight change: Filed Weights   11/10/23 0500 11/12/23 0523 11/13/23 0440  Weight: 105.8 kg 109.8 kg 108.6 kg   Intake/Output:  Intake/Output Summary (Last 24 hours) at 11/13/2023 0850 Last data filed at 11/13/2023 0609 Gross per 24 hour  Intake --  Output 2500 ml  Net -2500 ml    Physical Exam   GENERAL: fatigued, elderly F, NAD Lungs- clear  CARDIAC:  JVP: 7 cm          Normal rate with regular rhythm. No MRG. No LEE   ABDOMEN: Soft, non-tender, non-distended.  EXTREMITIES: Warm and well perfused.   NEUROLOGIC: No obvious FND   Telemetry   NSR 90s, (Personally reviewed)    Labs    CBC Recent Labs    11/12/23 0650 11/13/23 0445  WBC 5.2 5.3  HGB 8.3* 9.0*  HCT 26.5* 29.0*  MCV 82.3 82.6  PLT 146* 180   Basic Metabolic Panel Recent Labs    89/72/74 0615 11/12/23 0650 11/13/23 0445  NA 138 137 137  K 4.5 4.5 4.7  CL 104 103 101  CO2 22 23 26   GLUCOSE 102* 91 81  BUN 38* 34* 35*  CREATININE 1.80* 1.47* 1.40*  CALCIUM 8.7* 8.8* 9.2  MG 2.1 1.9  --    Liver Function Tests No results for input(s): AST, ALT, ALKPHOS, BILITOT, PROT, ALBUMIN in the last 72 hours.  ProBNP (last 3 results) Recent Labs    11/06/23 1733  PROBNP >35,000.0*   Hemoglobin A1C No results for input(s): HGBA1C in the last 72 hours.  Fasting Lipid Panel No results for input(s): CHOL, HDL, LDLCALC, TRIG, CHOLHDL, LDLDIRECT in the last 72 hours.  Thyroid Function Tests No results for input(s): TSH, T4TOTAL, T3FREE, THYROIDAB in the last 72 hours.  Invalid input(s): FREET3  Medications:    Scheduled Medications:  amLODipine  5 mg Oral Daily   amoxicillin-clavulanate  1 tablet Oral Q12H   apixaban  10 mg Oral BID   Followed by   NOREEN ON 11/19/2023] apixaban  5 mg Oral BID   Chlorhexidine  Gluconate Cloth  6 each Topical Daily   ferrous sulfate  325 mg Oral Q breakfast   guaiFENesin  1,200 mg Oral BID   hydrocerin   Topical BID   pantoprazole  40 mg Oral Daily   polyethylene glycol  17 g Oral BID   pregabalin  150 mg Oral BID   senna-docusate  2 tablet Oral Daily   sodium chloride  flush  3 mL Intravenous Q12H   sodium chloride  flush  3 mL Intravenous Q12H    Infusions:    PRN Medications: acetaminophen , HYDROmorphone (DILAUDID) injection, midazolam  PF, ondansetron  (ZOFRAN ) IV, ondansetron  (ZOFRAN ) IV, mouth rinse, oxyCODONE -acetaminophen , sodium chloride , sodium chloride  flush, sodium chloride  flush, technetium albumin  aggregated  Patient Profile   Vickie Collier 73 y.o. female with history of HTN, HLD, breast cancer s/p radiation on letrozole , GERD, arthritis, CKD IIIb. Seen for evaluation for cardiogenic shock in the setting of massive PE and  cor pulmonale.   Assessment/Plan  Cardiogenic shock due to massive PE and cor pulmonale - Echo 10/23: EF 35-40%, septal flattening, severely reduced RV, RA severely dilated, severe TR, dilated IVC - LE u/s +DVT - VQ 10/24 + bilateral PE - RHC 10/24 as above - s/p catheter directed t-PA on 10/24 - Echo 10/25 LVEF 60-65% RV function much improved. Mild- mod residual RV dysfunction with McConnell's sign RVSP 60mmHG  - CTA chest (10/27) multiple lobar and segmental PEs in both lungs without central embolism; main PA normal in size. Per IR would not be amendable to mechanical thrombectomy. Continue a/c - now on treatment dose Eliquis  - Co-ox stable off milrinone, 70%  - Volume ok, CVP 7. Transition to torsemide 20 mg daily    Elevated trop - due to massive PE   AKI on CKD IIIb due to ATN/PE - sCr baseline around 1.3 - Cr 2.06>2.98>2.77 -> 2.22>1.98>1.8>1.5>1.4 today    UTI - Primary team gave ceftriaxone x1 10/23 - Urine culture with lactobacillus; BCX ngtd (x4 days) - Treating w/ augmentin x5 days.    Anemia - Hgb 10.8>9>8.8 -> 8.4 -> 7.8 >7.9>>8.3 >>9.0  - Trend closely, watch for bleeding - FOBT ordered  - tsat 6; treated w/ IV Fe  Atypical atrial flutter - noted on tele this admit, in NSR this am - a/c per above  - suspect probable OSA (h/o snoring). Will need outpatient sleep study   Deconditioning - get OOB today and mobilize w/ PT/OT - suspect will need CIR vs SNF     Length of Stay: [phone] PA-C  11/13/2023, 8:50 AM  Advanced Heart Failure Team Pager [phone] (M-F; 7a - 5p)  Please contact [facility] for night-coverage after hours (5p -7a ) and weekends on [email or web address]  Patient seen with PA, I formulated the plan  and agree with the above note.   Patient CVP 7 today, she is down to 1 L [name]. She had a run of atrial flutter with controlled rate yesterday.   She denies dyspnea but feels weak.  Has not been out of bed.   General: NAD Neck: No JVD, no thyromegaly or thyroid nodule.  Lungs: Clear to auscultation bilaterally with normal respiratory effort. CV: Nondisplaced PMI.  Heart regular S1/S2, no S3/S4, no murmur.  No peripheral edema.    Abdomen: Soft, nontender, no hepatosplenomegaly, no distention.  Skin: Intact without lesions or rashes.  Neurologic: Alert and oriented x 3.  Psych: Normal affect. Extremities: No clubbing or cyanosis.  HEENT: Normal.   CVP 7, volume status ok.  Start torsemide 20 mg daily today.    Continue apixaban post-massive PE.    She has had short AFL runs.  She is in NSR currently.  She will be anticoagulated for PE regardless.   Weak, needs PT.  Suspect she will require SNF.  Cardiology will sign off, call with questions.   Ezra Shuck 11/13/2023 10:31 AM

## 2023-11-14 DIAGNOSIS — I2609 Other pulmonary embolism with acute cor pulmonale: Secondary | ICD-10-CM | POA: Diagnosis not present

## 2023-11-14 DIAGNOSIS — I5021 Acute systolic (congestive) heart failure: Secondary | ICD-10-CM | POA: Diagnosis not present

## 2023-11-14 DIAGNOSIS — I10 Essential (primary) hypertension: Secondary | ICD-10-CM | POA: Diagnosis not present

## 2023-11-14 DIAGNOSIS — I480 Paroxysmal atrial fibrillation: Secondary | ICD-10-CM | POA: Diagnosis not present

## 2023-11-14 LAB — BASIC METABOLIC PANEL WITH GFR
Anion gap: 13 (ref 5–15)
BUN: 46 mg/dL — ABNORMAL HIGH (ref 8–23)
CO2: 26 mmol/L (ref 22–32)
Calcium: 8.9 mg/dL (ref 8.9–10.3)
Chloride: 99 mmol/L (ref 98–111)
Creatinine, Ser: 1.7 mg/dL — ABNORMAL HIGH (ref 0.44–1.00)
GFR, Estimated: 31 mL/min — ABNORMAL LOW (ref >60)
Glucose, Bld: 85 mg/dL (ref 70–99)
Potassium: 4.7 mmol/L (ref 3.5–5.1)
Sodium: 138 mmol/L (ref 135–145)

## 2023-11-14 LAB — CBC
HCT: 29.6 % — ABNORMAL LOW (ref 36.0–46.0)
Hemoglobin: 9.3 g/dL — ABNORMAL LOW (ref 12.0–15.0)
MCH: 25.7 pg — ABNORMAL LOW (ref 26.0–34.0)
MCHC: 31.4 g/dL (ref 30.0–36.0)
MCV: 81.8 fL (ref 80.0–100.0)
Platelets: 193 K/uL (ref 150–400)
RBC: 3.62 MIL/uL — ABNORMAL LOW (ref 3.87–5.11)
RDW: 17.8 % — ABNORMAL HIGH (ref 11.5–15.5)
WBC: 5.8 K/uL (ref 4.0–10.5)
nRBC: 0 % (ref 0.0–0.2)

## 2023-11-14 LAB — COOXEMETRY PANEL
Carboxyhemoglobin: 1.8 % — ABNORMAL HIGH (ref 0.5–1.5)
Methemoglobin: <0.7 % (ref 0.0–1.5)
O2 Saturation: 72.2 %
Total hemoglobin: 10 g/dL — ABNORMAL LOW (ref 12.0–16.0)

## 2023-11-14 NOTE — Progress Notes (Addendum)
 Progress Note   Patient: Vickie Collier FMW:[id number] DOB: Dec 17, 1950 DOA: 11/06/2023     8 DOS: the patient was seen and examined on 11/14/2023   Brief hospital course: Vickie Collier was admitted to the hospital with the diagnosis of acute pulmonary embolism, complicated with cardiogenic shock.   73 year old female with past medical history of breast cancer, HTN, and dyslipidemia who presented with progressive weakness and and dyspnea for 1 week associated with exertional chest pain. Patient had severe symptoms to the point where she was not able to get out of the chair with assistance.   On her initial physical examination she was afebrile, HR 74, BP 122/77, RR 16, SpO2 95% on 2 L [place].  Her lungs has wheezing and rales, heart with S1 and S2 present and regular with no gallops or rubs, abdomen with no distention and no significant lower extremity edema.   Na 144, K 4.3 CL 107 bicarbonate 24 glucose 122 bun 26 cr 2,0  BNP > 35,000 High sensitive troponin 1,349, 1,364  Lactic acid 3,9  Wbc 7,9 hgb 10,8 plt 222  Sars COVID 19 negative RSV negative Influenza negative   Chest radiograph with hypoinflation, positive cardiomegaly with no infiltrates or effusions.    EKG 78 bpm, left axis deviation, normal intervals, qtc 543, sinus rhythm with no significant ST segment changes, diffuse T wave inversions.   Patient was started on heparin drip for NSTEMI and admitted.  On 10/23, patient had continued rise in troponins to peak of 8901 with rising lactic acid to 3.2, creatinine elevated to 2.36, hypotensive with cool extremities, concerning for cardiogenic shock.   TTE showed LVEF 35-40%, with signs of RV failure.   10/23 PCCM was consulted and she was transferred to [facility] and started on milrinone.  10/24, she underwent RHC which showed severe PAH with cor pulmonale and normal left-sided filling pressures suspicious for PE.   VQ scan concerning for PE with multiple large segmental perfusion  defects in bilaterally and no corresponding CXR abnormalities.   US  LE Doppler showed DVT in left soleal vein.   IR was consulted and she underwent B/L CDT with tPA infusion for 12 hours, and improvement in pulmonary pressures.  10/25, Levophed was stopped.  TTE showed LVEF 60-65%,  10/26 transfer to [facility].  10/28 transitioned to apixaban with good toleration.  10/29 transitioned to po torsemide.   Assessment and Plan: * Acute pulmonary embolism (HCC) Massive pulmonary embolism complicated with shock, now resolved.  Sp bilateral catheter directed pulmonary artery thrombolysis with good toleration.   Systolic blood pressure range 879 mmHg.  Plan to continue anticoagulation with apixaban.   Acute hypoxemic respiratory failure, improving, today with 02 saturation 97% on 1 L/min per [place]   US  doppler lower extremities with positive left leg DVT  Acute intramuscular thrombosis involving the left soleal veins.   Acute clinical systolic heart failure (HCC) 10/24 cardiac catheterization  RA 13.  RV 59/14 PA 68/30 mean 44 PCWP 9 Cardiac output 7.4 and index 3.5 (Fick) Cardiac output 4.5 and index 2.1 (thermodilution) PVR 9.2 WU  10/25 Recovered LV systolic function with repeat echocardiogram with LV EF 60 to 65%, interventricular septum flattened in systole and diastole, RV systolic function with moderate reduction, RVSP 60,4 mmHg.  Mild to moderate tricuspid valve regurgitation   RV failure.  Acute on chronic cor pulmonale.   Urine output is -3.760  Systolic blood pressure 120 mmHg range.  SV02 69.5   Plan to continue with torsemide  20 mg po daily.  Possible addition of spironolactone.   Paroxysmal atrial fibrillation (HCC) Patient back on sinus rhythm Continue anticoagulation with apixaban. Continue telemetry monitoring   Essential hypertension Continue with amlodipine.   Chronic kidney disease, stage 3b (HCC) Renal function with serum cr at 1,40 with K at 4,7 and serum  bicarbonate at 26  Na 137 and Mg 1.9   Plan to continue diuresis with torsemide 20 mg po daily Follow up renal function and electrolytes in am.  Avoid hypotension and nephrotoxic medications.   Type 2 diabetes mellitus with hyperlipidemia (HCC) Continue glucose cover and monitoring with insulin sliding scale Fasting glucose today 81 mg/dl  Resume statin   Iron deficiency anemia SP IV iron infusion Follow up hgb is 9,0 with plt 180  Continue close follow up on cell counts.   UTI (urinary tract infection) Plan to complete 5 days of Augmentin   Obesity, class 2 Calculated BMI is 39.4   Malignant neoplasm of upper-outer quadrant of right breast in female, estrogen receptor positive (HCC) Follow up as outpatient.         Subjective: Patient is feeling better, continue very weak and deconditioned, no chest pain   Physical Exam: Vitals:   11/13/23 2314 11/14/23 0332 11/14/23 0338 11/14/23 0929  BP: (!) 111/96  (!) 109/97 126/73  Pulse: 75 76 76 78  Resp: 14 16 16 16   Temp: 98.8 F (37.1 C) 98.8 F (37.1 C) 98.8 F (37.1 C) 98.6 F (37 C)  TempSrc: Oral Oral Oral Oral  SpO2: 97% 96% 96% 98%  Weight:      Height:       Neurology awake and alert ENT with mild pallor Cardiovascular with S1 and S2 present and regular with no gallops or rubs Respiratory with no rales or wheezing, poor inspiratory effort Abdomen with no distention  Trace non pitting lower extremity edema   Data Reviewed:    Family Communication: I spoke with patient's daughter at the bedside, we talked in detail about patient's condition, plan of care and prognosis and all questions were addressed.   Disposition: Status is: Inpatient Remains inpatient appropriate because: pending transfer to SNF   Planned Discharge Destination: Skilled nursing facility     Author: Elidia Toribio Furnace, MD 11/14/2023 11:13 AM  For on call review [email or web address].

## 2023-11-14 NOTE — Progress Notes (Signed)
 Order in chart to remove CVC. RN messaged MD, Arrien, to see if central line could be in place until discharge was imminent. Pt is waiting to be placed at a skilled nursing facility. MD stated he would still like the central line to be removed, and that it was ok to leave out PIV. MD gave verbal to put in nursing care order to leave IV out.

## 2023-11-14 NOTE — Plan of Care (Signed)
  Problem: Education: Goal: Understanding of cardiac disease, CV risk reduction, and recovery process will improve Outcome: Progressing   Problem: Activity: Goal: Ability to tolerate increased activity will improve Outcome: Progressing   Problem: Cardiac: Goal: Ability to achieve and maintain adequate cardiovascular perfusion will improve Outcome: Progressing   Problem: Health Behavior/Discharge Planning: Goal: Ability to safely manage health-related needs after discharge will improve Outcome: Progressing   Problem: Education: Goal: Knowledge of General Education information will improve Description: Including pain rating scale, medication(s)/side effects and non-pharmacologic comfort measures Outcome: Progressing   Problem: Health Behavior/Discharge Planning: Goal: Ability to manage health-related needs will improve Outcome: Progressing   Problem: Clinical Measurements: Goal: Ability to maintain clinical measurements within normal limits will improve Outcome: Progressing Goal: Will remain free from infection Outcome: Progressing Goal: Diagnostic test results will improve Outcome: Progressing Goal: Respiratory complications will improve Outcome: Progressing Goal: Cardiovascular complication will be avoided Outcome: Progressing   Problem: Activity: Goal: Risk for activity intolerance will decrease Outcome: Progressing   Problem: Nutrition: Goal: Adequate nutrition will be maintained Outcome: Progressing   Problem: Coping: Goal: Level of anxiety will decrease Outcome: Progressing   Problem: Elimination: Goal: Will not experience complications related to bowel motility Outcome: Progressing Goal: Will not experience complications related to urinary retention Outcome: Progressing   Problem: Pain Managment: Goal: General experience of comfort will improve and/or be controlled Outcome: Progressing   Problem: Safety: Goal: Ability to remain free from injury will  improve Outcome: Progressing   Problem: Skin Integrity: Goal: Risk for impaired skin integrity will decrease Outcome: Progressing   Problem: Education: Goal: Understanding of CV disease, CV risk reduction, and recovery process will improve Outcome: Progressing   Problem: Cardiovascular: Goal: Ability to achieve and maintain adequate cardiovascular perfusion will improve Outcome: Progressing   Problem: Health Behavior/Discharge Planning: Goal: Ability to safely manage health-related needs after discharge will improve Outcome: Progressing

## 2023-11-14 NOTE — Plan of Care (Signed)

## 2023-11-14 NOTE — TOC Progression Note (Addendum)
 Transition of Care [facility]) - Progression Note    Patient Details  Name: Vickie Collier MRN: [id number] Date of Birth: 08/06/1950  Transition of Care [facility] CM/SW Contact  Arlana JINNY Nicholaus [place] Phone Number: [phone] 11/14/2023, 12:12 PM  Clinical Narrative:  HF CSW spoke with Tammy who confirmed they have bed availability at [facility] in [place].    CSW called and spoke with the patients daughter who stated that they are ok with that facility and gave permission for auth to be started.   Auth pending, id [id number]   [facility] CSW/CM will continue to follow and monitor for dc readiness.     Expected Discharge Plan: IP Rehab Facility Barriers to Discharge: Continued Medical Work up               Expected Discharge Plan and Services   Discharge Planning Services: CM Consult Post Acute Care Choice: IP Rehab Living arrangements for the past 2 months: Single Family Home                                       Social Drivers of Health (SDOH) Interventions SDOH Screenings   Food Insecurity: Patient Unable To Answer (11/08/2023)  Housing: Unknown (11/08/2023)  Transportation Needs: Patient Unable To Answer (11/08/2023)  Utilities: Patient Unable To Answer (11/08/2023)  Financial Resource Strain: Low Risk  (02/10/2019)   Received from [facility]  Physical Activity: Insufficiently Active (08/28/2017)   Received from [facility]  Social Connections: Patient Unable To Answer (11/08/2023)  Stress: No Stress Concern Present (08/28/2017)   Received from [facility]  Tobacco Use: Low Risk  (11/08/2023)    Readmission Risk Interventions     No data to display

## 2023-11-14 NOTE — Progress Notes (Signed)
 CVC removed. Catheter intact upon removal. Pt tolerated procedure well. Occlusive dressing (vaseline gauze, dry gauze, tegaderm) applied to site.

## 2023-11-15 DIAGNOSIS — I130 Hypertensive heart and chronic kidney disease with heart failure and stage 1 through stage 4 chronic kidney disease, or unspecified chronic kidney disease: Secondary | ICD-10-CM | POA: Diagnosis not present

## 2023-11-15 DIAGNOSIS — I2609 Other pulmonary embolism with acute cor pulmonale: Secondary | ICD-10-CM | POA: Diagnosis not present

## 2023-11-15 DIAGNOSIS — I2699 Other pulmonary embolism without acute cor pulmonale: Secondary | ICD-10-CM | POA: Diagnosis not present

## 2023-11-15 DIAGNOSIS — I2510 Atherosclerotic heart disease of native coronary artery without angina pectoris: Secondary | ICD-10-CM | POA: Diagnosis not present

## 2023-11-15 DIAGNOSIS — E119 Type 2 diabetes mellitus without complications: Secondary | ICD-10-CM | POA: Diagnosis not present

## 2023-11-15 DIAGNOSIS — I10 Essential (primary) hypertension: Secondary | ICD-10-CM | POA: Diagnosis not present

## 2023-11-15 DIAGNOSIS — R2689 Other abnormalities of gait and mobility: Secondary | ICD-10-CM | POA: Diagnosis not present

## 2023-11-15 DIAGNOSIS — M6281 Muscle weakness (generalized): Secondary | ICD-10-CM | POA: Diagnosis not present

## 2023-11-15 DIAGNOSIS — E1169 Type 2 diabetes mellitus with other specified complication: Secondary | ICD-10-CM | POA: Diagnosis not present

## 2023-11-15 DIAGNOSIS — N390 Urinary tract infection, site not specified: Secondary | ICD-10-CM | POA: Diagnosis not present

## 2023-11-15 DIAGNOSIS — I214 Non-ST elevation (NSTEMI) myocardial infarction: Secondary | ICD-10-CM | POA: Diagnosis not present

## 2023-11-15 DIAGNOSIS — E66812 Obesity, class 2: Secondary | ICD-10-CM | POA: Diagnosis not present

## 2023-11-15 DIAGNOSIS — D509 Iron deficiency anemia, unspecified: Secondary | ICD-10-CM | POA: Diagnosis not present

## 2023-11-15 DIAGNOSIS — I5021 Acute systolic (congestive) heart failure: Secondary | ICD-10-CM | POA: Diagnosis not present

## 2023-11-15 DIAGNOSIS — F4321 Adjustment disorder with depressed mood: Secondary | ICD-10-CM | POA: Diagnosis not present

## 2023-11-15 DIAGNOSIS — C50911 Malignant neoplasm of unspecified site of right female breast: Secondary | ICD-10-CM | POA: Diagnosis not present

## 2023-11-15 DIAGNOSIS — E785 Hyperlipidemia, unspecified: Secondary | ICD-10-CM | POA: Diagnosis not present

## 2023-11-15 DIAGNOSIS — I5022 Chronic systolic (congestive) heart failure: Secondary | ICD-10-CM | POA: Diagnosis not present

## 2023-11-15 DIAGNOSIS — I480 Paroxysmal atrial fibrillation: Secondary | ICD-10-CM | POA: Diagnosis not present

## 2023-11-15 DIAGNOSIS — R279 Unspecified lack of coordination: Secondary | ICD-10-CM | POA: Diagnosis not present

## 2023-11-15 DIAGNOSIS — N1832 Chronic kidney disease, stage 3b: Secondary | ICD-10-CM | POA: Diagnosis not present

## 2023-11-15 LAB — CBC
HCT: 31.3 % — ABNORMAL LOW (ref 36.0–46.0)
Hemoglobin: 9.9 g/dL — ABNORMAL LOW (ref 12.0–15.0)
MCH: 25.9 pg — ABNORMAL LOW (ref 26.0–34.0)
MCHC: 31.6 g/dL (ref 30.0–36.0)
MCV: 81.9 fL (ref 80.0–100.0)
Platelets: 221 K/uL (ref 150–400)
RBC: 3.82 MIL/uL — ABNORMAL LOW (ref 3.87–5.11)
RDW: 17.5 % — ABNORMAL HIGH (ref 11.5–15.5)
WBC: 5.6 K/uL (ref 4.0–10.5)
nRBC: 0 % (ref 0.0–0.2)

## 2023-11-15 LAB — BASIC METABOLIC PANEL WITH GFR
Anion gap: 15 (ref 5–15)
BUN: 57 mg/dL — ABNORMAL HIGH (ref 8–23)
CO2: 24 mmol/L (ref 22–32)
Calcium: 9.3 mg/dL (ref 8.9–10.3)
Chloride: 97 mmol/L — ABNORMAL LOW (ref 98–111)
Creatinine, Ser: 1.85 mg/dL — ABNORMAL HIGH (ref 0.44–1.00)
GFR, Estimated: 28 mL/min — ABNORMAL LOW (ref >60)
Glucose, Bld: 104 mg/dL — ABNORMAL HIGH (ref 70–99)
Potassium: 4.4 mmol/L (ref 3.5–5.1)
Sodium: 136 mmol/L (ref 135–145)

## 2023-11-15 MED ORDER — APIXABAN 5 MG PO TABS: ORAL_TABLET | ORAL | 0 refills | Status: DC

## 2023-11-15 MED ORDER — TORSEMIDE 20 MG PO TABS: 20.0000 mg | ORAL_TABLET | Freq: Every day | ORAL | 0 refills | Status: DC

## 2023-11-15 MED ORDER — ACETAMINOPHEN 325 MG PO TABS: 650.0000 mg | ORAL_TABLET | Freq: Four times a day (QID) | ORAL | Status: DC | PRN

## 2023-11-15 MED ORDER — OXYCODONE-ACETAMINOPHEN 5-325 MG PO TABS: 1.0000 | ORAL_TABLET | Freq: Three times a day (TID) | ORAL | 0 refills | Status: AC | PRN

## 2023-11-15 MED ORDER — POLYETHYLENE GLYCOL 3350 17 G PO PACK: 17.0000 g | PACK | Freq: Two times a day (BID) | ORAL | 0 refills | Status: AC

## 2023-11-15 MED ORDER — ACETAMINOPHEN 325 MG PO TABS: 650.0000 mg | ORAL_TABLET | Freq: Four times a day (QID) | ORAL | Status: AC | PRN

## 2023-11-15 MED ORDER — AMLODIPINE BESYLATE 5 MG PO TABS: 5.0000 mg | ORAL_TABLET | Freq: Every day | ORAL | 0 refills | Status: DC

## 2023-11-15 MED ORDER — ONDANSETRON HCL 4 MG PO TABS
4.0000 mg | ORAL_TABLET | Freq: Once | ORAL | Status: AC
  Administered 2023-11-15: 4 mg via ORAL
  Filled 2023-11-15: qty 1

## 2023-11-15 MED ORDER — TORSEMIDE 20 MG PO TABS: 20.0000 mg | ORAL_TABLET | Freq: Every day | ORAL | Status: DC

## 2023-11-15 NOTE — Discharge Summary (Addendum)
 Physician Discharge Summary   Patient: Vickie Collier MRN: [id number] DOB: [birth year]  Admit date:     11/06/2023  Discharge date: 11/15/23  Discharge Physician: Elidia Sieving Taym Twist   PCP: Benjamine Aland, MD   Recommendations at discharge:    Blood pressure regimen reduced to only amlodipine at low dose, due to risk of hypotension. Resume torsemide on 11/18/23 Continue anticoagulation load with apixaban 10 mg twice daily until 11/18/23 then on 11/19/23 start taking 5 mg po twice daily. Follow up renal function and electrolytes in 7 days as outpatient Follow up with Dr Benjamine in 7 to 10 days Follow up with Cardiology as scheduled.   I spoke with patient's daughter at the bedside, we talked in detail about patient's condition, plan of care and prognosis and all questions were addressed.   Discharge Diagnoses: Principal Problem:   Acute pulmonary embolism (HCC) Active Problems:   Acute clinical systolic heart failure (HCC)   Paroxysmal atrial fibrillation (HCC)   Essential hypertension   Chronic kidney disease, stage 3b (HCC)   Type 2 diabetes mellitus with hyperlipidemia (HCC)   Iron deficiency anemia   UTI (urinary tract infection)   Malignant neoplasm of upper-outer quadrant of right breast in female, estrogen receptor positive (HCC)   Obesity, class 2  Resolved Problems:   * No resolved hospital problems. [facility] Course: Vickie Collier was admitted to the hospital with the diagnosis of acute pulmonary embolism, complicated with cardiogenic shock.   73 year old female with past medical history of breast cancer, HTN, obesity and dyslipidemia who presented with progressive weakness and and dyspnea for 1 week associated with exertional chest pain. Patient had severe symptoms to the point where she was not able to get out of the chair with assistance.   On her initial physical examination she was afebrile, HR 74, BP 122/77, RR 16, SpO2 95% on 2 L [place].  Her lungs has wheezing  and rales, heart with S1 and S2 present and regular with no gallops or rubs, abdomen with no distention and no significant lower extremity edema.   Na 144, K 4.3 CL 107 bicarbonate 24 glucose 122 bun 26 cr 2,0  BNP > 35,000 High sensitive troponin 1,349, 1,364  Lactic acid 3,9  Wbc 7,9 hgb 10,8 plt 222  Sars COVID 19 negative RSV negative Influenza negative   Chest radiograph with hypoinflation, positive cardiomegaly with no infiltrates or effusions.    EKG 78 bpm, left axis deviation, normal intervals, qtc 543, sinus rhythm with no significant ST segment changes, diffuse T wave inversions.   Patient was started on heparin drip for suspected acute coronary syndrome, non ST elevation MI.   On 10/23, patient had continued rise in troponins to peak of 8901 with rising lactic acid to 3.2, creatinine elevated to 2.36, hypotensive with cool extremities, concerning for cardiogenic shock.   TTE showed LVEF 35-40%, with signs of RV failure.   10/23 PCCM was consulted and she was transferred to [facility] and started on milrinone.  10/24, she underwent RHC which showed severe PAH with cor pulmonale and normal left-sided filling pressures suspicious for PE.   VQ scan concerning for PE with multiple large segmental perfusion defects in bilaterally and no corresponding CXR abnormalities.   US  LE Doppler showed DVT in left soleal vein.   IR was consulted and she underwent bilateral catheter directed thrombolysis with tPA infusion for 12 hours, with further improvement in pulmonary pressures.  10/25, norepinephrine was stopped.  TTE showed LVEF  60-65%,  10/26 transfer to [facility].  10/28 transitioned to apixaban with good toleration.  10/29 transitioned to po torsemide.  10/31 patient medical stable for discharge, follow up with as outpatient.   Assessment and Plan: * Acute pulmonary embolism (HCC) Massive pulmonary embolism complicated with shock, now resolved.  Sp bilateral catheter directed pulmonary  artery thrombolysis with good toleration.   Plan to continue anticoagulation with apixaban.   Acute hypoxemic respiratory failure, improving, she was weaned off supplemental 02 and today 02 saturation today is 96% on room air.   US  doppler lower extremities with positive left leg DVT  Acute intramuscular thrombosis involving the left soleal veins.   NSTEMI ruled out   Acute clinical systolic heart failure (HCC) 10/23 echocardiogram with reduced LV systolic function with EF 35 to 40%, global hypokinesis, grade I diastolic dysfunction with impaired relaxation, RV with severe reduction in systolic function, RV with moderate enlargement, RVSP 57.5 mmHg, severe dilatation of RV, severe tricuspid valve regurgitation,   10/24 cardiac catheterization  RA 13.  RV 59/14 PA 68/30 mean 44 PCWP 9 Cardiac output 7.4 and index 3.5 (Fick) Cardiac output 4.5 and index 2.1 (thermodilution) PVR 9.2 WU  10/25 Recovered LV systolic function with repeat echocardiogram with LV EF 60 to 65%, interventricular septum flattened in systole and diastole, RV systolic function with moderate reduction, RVSP 60,4 mmHg.  Mild to moderate tricuspid valve regurgitation   RV failure.  Acute on chronic cor pulmonale.  Pulmonary hypertension  Patient was placed on IV furosemide for diuresis, negative fluid balance was achieved, - 7,524 ml, with further improvement in her symptoms.   Patient will continue diuresis with torsemide 20 mg po daily to start on 11/03  Limited medical therapy due to reduced GFR and risk of hypotension.   Paroxysmal atrial fibrillation (HCC) Patient converted back on sinus rhythm Continue anticoagulation with apixaban.   Essential hypertension Continue with amlodipine.   Chronic kidney disease, stage 3b (HCC) AKI  At the time of her discharge her serum cr is 1,85 with K at 4,4 and serum bicarbonate at 24.  Na 136  Peaked serum cr 2,9 on 10/23.   Plan to resume torsemide 20 mg po  daily on Nov 03  Follow up renal function in 7 days as outpatient.   Type 2 diabetes mellitus with hyperlipidemia (HCC) Patient was placed on insulin sliding scale during her hospitalization for glucose cover and monitoring Her discharge fasting glucose is 104 mg.dl  Continue with statin   Iron deficiency anemia SP IV iron infusion Follow up hgb is 9,9 with plt 221   UTI (urinary tract infection) Completed 5 days of Augmentin   Obesity, class 2 Calculated BMI is 39.4   Malignant neoplasm of upper-outer quadrant of right breast in female, estrogen receptor positive (HCC) Follow up as outpatient.       Consultants: cardiology IR  Procedures performed: as above   Disposition: Skilled nursing facility Diet recommendation:  Cardiac and Carb modified diet DISCHARGE MEDICATION: Allergies as of 11/15/2023       Reactions   Lisinopril Cough        Medication List     STOP taking these medications    baclofen 10 MG tablet Commonly known as: LIORESAL   felodipine 5 MG 24 hr tablet Commonly known as: PLENDIL   lidocaine  4 %   metoprolol succinate 50 MG 24 hr tablet Commonly known as: TOPROL-XL   nebivolol 10 MG tablet Commonly known as: BYSTOLIC   pregabalin  150 MG capsule Commonly known as: Lyrica   valsartan-hydrochlorothiazide 80-12.5 MG tablet Commonly known as: DIOVAN-HCT       TAKE these medications    acetaminophen  325 MG tablet Commonly known as: TYLENOL  Take 2 tablets (650 mg total) by mouth every 6 (six) hours as needed for moderate pain (pain score 4-6) or mild pain (pain score 1-3) (pain).   amLODipine 5 MG tablet Commonly known as: NORVASC Take 1 tablet (5 mg total) by mouth daily. Start taking on: November 16, 2023 What changed:  medication strength how much to take   apixaban 5 MG Tabs tablet Commonly known as: ELIQUIS Take 2 tablets (10 mg total) by mouth 2 (two) times daily for 4 days, THEN 1 tablet (5 mg total) 2 (two) times  daily. Take two tablets twice daily until 11/18/23, then on 11/19/23 start taking one tablet twice daily. Start taking on: November 15, 2023   cholecalciferol 25 MCG (1000 UNIT) tablet Commonly known as: VITAMIN D3 Take 1,000 Units by mouth daily.   cyanocobalamin 100 MCG tablet Take 100 mcg by mouth daily.   letrozole  2.5 MG tablet Commonly known as: FEMARA  Take 1 tablet (2.5 mg total) by mouth daily.   omeprazole 40 MG capsule Commonly known as: PRILOSEC Take 40 mg by mouth 2 (two) times daily as needed.   oxyCODONE -acetaminophen  5-325 MG tablet Commonly known as: PERCOCET/ROXICET Take 1 tablet by mouth every 8 (eight) hours as needed for severe pain (pain score 7-10).   polyethylene glycol 17 g packet Commonly known as: MIRALAX / GLYCOLAX Take 17 g by mouth 2 (two) times daily.   rosuvastatin 5 MG tablet Commonly known as: CRESTOR Take 5 mg by mouth daily.   torsemide 20 MG tablet Commonly known as: DEMADEX Take 1 tablet (20 mg total) by mouth daily. Start taking on: November 18, 2023        Follow-up Information     [facility] Follow up.   Specialty: Cardiology Why: 11/28/23 at 2:30 PM   Hospital Follow up in the Advanced Heart Failure Clinic at [facility], [place] (Women and Children's Entrance, Valet parking availiable) Contact information: [address] [place]  [phone]               Discharge Exam: Fredricka Weights   11/12/23 0523 11/13/23 0440 11/15/23 0326  Weight: 109.8 kg 108.6 kg 108.1 kg   BP 126/60 (BP Location: Left Arm)   Pulse 79   Temp 99 F (37.2 C) (Oral)   Resp 18   Ht 5' 5 (1.651 m)   Wt 108.1 kg   SpO2 96%   BMI 39.66 kg/m   Patient is feeling better, her dyspnea has improved and now is on room air, no chest pain, no PND or orthopnea continue very weak and deconditioned  Neurology awake and alert ENT with mild  pallor Cardiovascular with S1 and S2 present and regular with no gallops, rubs or murmurs Respiratory with no rales or wheezing, poor inspiratory effort, anterior auscultation  Abdomen with no distention  No lower extremity edema   Condition at discharge: stable  The results of significant diagnostics from this hospitalization (including imaging, microbiology, ancillary and laboratory) are listed below for reference.   Imaging Studies: CT Angio Chest Pulmonary Embolism (PE) W or WO Contrast Result Date: 11/11/2023 EXAM: CTA of the Chest with contrast for PE 11/11/2023 07:38:29 PM TECHNIQUE: CTA of the chest was performed without  and with the administration of 75 mL of iohexol (OMNIPAQUE) 350 MG/ML intravenous contrast. Multiplanar reformatted images are provided for review. MIP images are provided for review. Automated exposure control, iterative reconstruction, and/or weight based adjustment of the mA/kV was utilized to reduce the radiation dose to as low as reasonably achievable. COMPARISON: V/Q scan 11/08/2023. CLINICAL HISTORY: FINDINGS: PULMONARY ARTERIES: Pulmonary arteries are adequately opacified for evaluation. Lobar and segmental pulmonary emboli in the right upper lobe, left upper lobe, right middle lobe, and right lower lobe. Segmental pulmonary emboli in the left lower lobe. No central pulmonary embolism identified. Main pulmonary artery is normal in size. MEDIASTINUM: Heart is enlarged. There are atherosclerotic calcifications of the aorta. Left-sided central venous catheter tip is in the proximal SVC. LYMPH NODES: No mediastinal, hilar or axillary lymphadenopathy. LUNGS AND PLEURA: Atelectasis in the bilateral lower lobes. Ground-glass and patchy airspace opacities are seen in the left upper lobe. No pleural effusion or pneumothorax. UPPER ABDOMEN: There is a small hiatal hernia. SOFT TISSUES AND BONES: Surgical clips in the right breast in the region of lobulated soft tissue density  measuring 4.4 x 3.5 cm. No acute bone abnormality. IMPRESSION: 1. Lobar and segmental pulmonary emboli in the right upper lobe, left upper lobe, right middle lobe, and right lower lobe, and segmental pulmonary emboli in the left lower lobe; no central pulmonary embolism. Main pulmonary artery normal in size. 2. Bilateral lower lobe atelectasis and ground-glass/patchy airspace opacities in the left upper lobe, which may reflect infection, inflammation, or edema; correlate clinically. 3. Cardiomegaly with atherosclerotic calcifications of the aorta. 4. Left-sided central venous catheter with tip in the proximal SVC. 5. Postsurgical changes in the right breast with lobulated soft tissue density measuring 4.4 x 3.5 cm; correlate with clinical/surgical history and prior breast imaging if available. Electronically signed by: Greig Pique MD 11/11/2023 07:47 PM EDT RP Workstation: [id number]   IR THROMB F/U EVAL ART/VEN FINAL DAY (MS) Result Date: 11/10/2023 INDICATION: 73 year old female with history of high risk pulmonary embolism, follow-up after initiation of bilateral pulmonary arterial thrombolysis. EXAM: IR THROMB F/U EVAL ART/VEN FINAL DAY COMPARISON:  11/08/2023 MEDICATIONS: None. ANESTHESIA/SEDATION: None. FLUOROSCOPY TIME:  None. COMPLICATIONS: None immediate. TECHNIQUE: Pulmonary arterial pressures were measured at bedside which are noted to be 33/23 with a mean of 31 mm Hg, previously 61/29 with a mean of 41 mm Hg. The patient's breathing was subjectively improved. Tachycardia had resolved. The tPA infusions were stopped and the indwelling pulmonary artery catheters as well as the right internal jugular sheath were removed. Hemostasis was achieved. Sterile bandage was applied. IMPRESSION: Improvement of mean pulmonary artery pressure from 41 to 31 mm Hg after 12 hours of bilateral catheter directed thrombolysis. Thrombolysis was terminated and the catheters and indwelling sheaths were removed. Ester Sides, MD [facility] Electronically Signed   By: Ester Sides M.D.   On: 11/10/2023 06:02   ECHOCARDIOGRAM COMPLETE Result Date: 11/09/2023    ECHOCARDIOGRAM REPORT   Patient Name:   Vickie Collier Date of Exam: 11/09/2023 Medical Rec #:  [id number]       Height:       65.0 in Accession #:    [id number]      Weight:       238.1 lb Date of Birth:  05/01/1950      BSA:          2.130 m Patient Age:    70 years  BP:           142/58 mmHg Patient Gender: F               HR:           75 bpm. Exam Location:  Inpatient Procedure: 2D Echo, Cardiac Doppler and Color Doppler (Both Spectral and Color            Flow Doppler were utilized during procedure). Indications:    Atrial Fibrillation I48.91  History:        Patient has prior history of Echocardiogram examinations, most                 recent 11/07/2023. Previous Myocardial Infarction. Breast cancer                 (R) breast.  Sonographer:    Thea Norlander RCS Referring Phys: HUSSEIN, ABDULLAHI IMPRESSIONS  1. Left ventricular ejection fraction, by estimation, is 60 to 65%. The left ventricle has normal function. The left ventricle has no regional wall motion abnormalities. Left ventricular diastolic parameters were normal. There is the interventricular septum is flattened in systole and diastole, consistent with right ventricular pressure and volume overload.  2. Right ventricular systolic function is moderately reduced. The right ventricular size is moderately enlarged. There is severely elevated pulmonary artery systolic pressure. The estimated right ventricular systolic pressure is 60.4 mmHg.  3. The mitral valve is grossly normal. Trivial mitral valve regurgitation. No evidence of mitral stenosis.  4. Tricuspid valve regurgitation is mild to moderate.  5. The aortic valve is tricuspid. There is moderate calcification of the aortic valve. There is moderate thickening of the aortic valve.  Aortic valve regurgitation is not visualized. Aortic valve sclerosis is present, with no evidence of aortic valve stenosis.  6. The inferior vena cava is dilated in size with >50% respiratory variability, suggesting right atrial pressure of 8 mmHg. Conclusion(s)/Recommendation(s): Findings consistent with Cor Pulmonale. FINDINGS  Left Ventricle: Left ventricular ejection fraction, by estimation, is 60 to 65%. The left ventricle has normal function. The left ventricle has no regional wall motion abnormalities. The left ventricular internal cavity size was normal in size. There is  no left ventricular hypertrophy. The interventricular septum is flattened in systole and diastole, consistent with right ventricular pressure and volume overload. Left ventricular diastolic parameters were normal. Right Ventricle: The right ventricular size is moderately enlarged. No increase in right ventricular wall thickness. Right ventricular systolic function is moderately reduced. There is severely elevated pulmonary artery systolic pressure. The tricuspid regurgitant velocity is 3.62 m/s, and with an assumed right atrial pressure of 8 mmHg, the estimated right ventricular systolic pressure is 60.4 mmHg. Left Atrium: Left atrial size was normal in size. Right Atrium: Right atrial size was normal in size. Pericardium: There is no evidence of pericardial effusion. Mitral Valve: The mitral valve is grossly normal. Trivial mitral valve regurgitation. No evidence of mitral valve stenosis. Tricuspid Valve: The tricuspid valve is grossly normal. Tricuspid valve regurgitation is mild to moderate. No evidence of tricuspid stenosis. Aortic Valve: The aortic valve is tricuspid. There is moderate calcification of the aortic valve. There is moderate thickening of the aortic valve. Aortic valve regurgitation is not visualized. Aortic valve sclerosis is present, with no evidence of aortic valve stenosis. Aortic valve mean gradient measures 11.5  mmHg. Aortic valve peak gradient measures 22.5 mmHg. Aortic valve area, by VTI measures 1.76 cm. Pulmonic Valve: The pulmonic valve was grossly normal. Pulmonic valve regurgitation is  not visualized. No evidence of pulmonic stenosis. Aorta: The aortic root and ascending aorta are structurally normal, with no evidence of dilitation. Venous: The inferior vena cava is dilated in size with greater than 50% respiratory variability, suggesting right atrial pressure of 8 mmHg. IAS/Shunts: The atrial septum is grossly normal.  LEFT VENTRICLE PLAX 2D LVIDd:         3.80 cm   Diastology LVIDs:         2.50 cm   LV e' medial:    10.00 cm/s LV PW:         1.10 cm   LV E/e' medial:  6.5 LV IVS:        1.00 cm   LV e' lateral:   8.81 cm/s LVOT diam:     2.00 cm   LV E/e' lateral: 7.4 LV SV:         60 LV SV Index:   28 LVOT Area:     3.14 cm LV IVRT:       81 msec  RIGHT VENTRICLE             IVC RV S prime:     18.40 cm/s  IVC diam: 2.30 cm TAPSE (M-mode): 1.6 cm LEFT ATRIUM           Index        RIGHT ATRIUM           Index LA diam:      3.40 cm 1.60 cm/m   RA Area:     16.40 cm LA Vol (A2C): 42.6 ml 20.00 ml/m  RA Volume:   37.20 ml  17.46 ml/m LA Vol (A4C): 18.0 ml 8.45 ml/m  AORTIC VALVE AV Area (Vmax):    1.70 cm AV Area (Vmean):   1.60 cm AV Area (VTI):     1.76 cm AV Vmax:           237.00 cm/s AV Vmean:          154.500 cm/s AV VTI:            0.343 m AV Peak Grad:      22.5 mmHg AV Mean Grad:      11.5 mmHg LVOT Vmax:         128.00 cm/s LVOT Vmean:        78.600 cm/s LVOT VTI:          0.192 m LVOT/AV VTI ratio: 0.56  AORTA Ao Root diam: 3.00 cm Ao Asc diam:  3.30 cm MITRAL VALVE               TRICUSPID VALVE MV Area (PHT): 3.37 cm    TR Peak grad:   52.4 mmHg MV Decel Time: 225 msec    TR Vmax:        362.00 cm/s MV E velocity: 65.30 cm/s MV A velocity: 70.20 cm/s  SHUNTS MV E/A ratio:  0.93        Systemic VTI:  0.19 m                            Systemic Diam: 2.00 cm Darryle Decent MD Electronically  signed by Darryle Decent MD Signature Date/Time: 11/09/2023/12:40:34 PM    Final    IR INFUSION THROMBOL ARTERIAL INITIAL (MS) Result Date: 11/09/2023 INDICATION: 73 year old female with history of high risk pulmonary embolism with contraindication to iodinated contrast. EXAM: 1. Ultrasound-guided vascular access of the right internal jugular  vein x2. 2. Selective catheterization of the bilateral pulmonary arteries. 3. Initiation of bilateral pulmonary arterial thrombolysis. COMPARISON:  11/08/2023 MEDICATIONS: None. ANESTHESIA/SEDATION: Moderate (conscious) sedation was employed during this procedure. A total of Versed  1 mg and Fentanyl  50 mcg was administered intravenously. Moderate Sedation Time: 28 minutes. The patient's level of consciousness and vital signs were monitored continuously by radiology nursing throughout the procedure under my direct supervision. FLUOROSCOPY TIME:  Twenty mGy reference air kerma CONTRAST:  None. COMPLICATIONS: None immediate. TECHNIQUE: Informed written consent was obtained from the patient after a thorough discussion of the procedural risks, benefits and alternatives. All questions were addressed. Maximal Sterile Barrier Technique was utilized including caps, mask, sterile gowns, sterile gloves, sterile drape, hand hygiene and skin antiseptic. A timeout was performed prior to the initiation of the procedure. Preprocedure ultrasound evaluation of the right internal jugular vein demonstrated patency. The procedure was planned. At approximately 1 cm apart, 2 separate sites were planned for venous access. Subdermal Local anesthesia was administered at each planned access site. Small skin next from 8. Under direct ultrasound visualization, 2 separate micropuncture sticks were performed and images were saved sonographically in the permanent record. Micropuncture sheath were placed. Through the more central micropuncture, a Wholey wire was inserted over which the micropuncture sheath  was exchanged for a 6 French, 10 cm vascular sheath. Over the wire, a 5 French C2 catheter was directed under fluoroscopic visualization to the right inferior lobar pulmonary artery. The catheter was exchanged for a 5 French, 90 cm, 10 cm infusion length UniFuse catheter. Pulmonary manometry was performed. Next, over the more peripheral IJ access site, the micropuncture sheath was exchanged over a Wholey wire for a 6 French, 10 cm vascular sheath. A 5 French C2 catheter was inserted in used to select the left inferior lobar pulmonary artery. The catheter was exchanged for a 5 French, 90 cm, 10 cm infusion length UniFuse catheter. The catheters and sheath were affixed to the skin with interrupted 0 silk sutures and bandages. Thrombolysis was initiated. The patient tolerated the procedure well was transferred back to the ICU in stable condition. FINDINGS: Right pulmonary artery pressure measured 61/29, mean 41 mmHg. Thrombolysis was initiated 0.5 mg tPA per hour via each catheter. IMPRESSION: Technically successful fluoroscopic guided initiation of bilateral pulmonary artery thrombolysis. Ester Sides, MD [facility] Radiology Electronically Signed   By: Ester Sides M.D.   On: 11/09/2023 05:25   IR INFUSION THROMBOL ARTERIAL INITIAL (MS) Result Date: 11/09/2023 INDICATION: 73 year old female with history of high risk pulmonary embolism with contraindication to iodinated contrast. EXAM: 1. Ultrasound-guided vascular access of the right internal jugular vein x2. 2. Selective catheterization of the bilateral pulmonary arteries. 3. Initiation of bilateral pulmonary arterial thrombolysis. COMPARISON:  11/08/2023 MEDICATIONS: None. ANESTHESIA/SEDATION: Moderate (conscious) sedation was employed during this procedure. A total of Versed  1 mg and Fentanyl  50 mcg was administered intravenously. Moderate Sedation Time: 28 minutes. The patient's level of consciousness and  vital signs were monitored continuously by radiology nursing throughout the procedure under my direct supervision. FLUOROSCOPY TIME:  Twenty mGy reference air kerma CONTRAST:  None. COMPLICATIONS: None immediate. TECHNIQUE: Informed written consent was obtained from the patient after a thorough discussion of the procedural risks, benefits and alternatives. All questions were addressed. Maximal Sterile Barrier Technique was utilized including caps, mask, sterile gowns, sterile gloves, sterile drape, hand hygiene and skin antiseptic. A timeout was performed prior to the initiation of the procedure. Preprocedure ultrasound evaluation of the  right internal jugular vein demonstrated patency. The procedure was planned. At approximately 1 cm apart, 2 separate sites were planned for venous access. Subdermal Local anesthesia was administered at each planned access site. Small skin next from 8. Under direct ultrasound visualization, 2 separate micropuncture sticks were performed and images were saved sonographically in the permanent record. Micropuncture sheath were placed. Through the more central micropuncture, a Wholey wire was inserted over which the micropuncture sheath was exchanged for a 6 French, 10 cm vascular sheath. Over the wire, a 5 French C2 catheter was directed under fluoroscopic visualization to the right inferior lobar pulmonary artery. The catheter was exchanged for a 5 French, 90 cm, 10 cm infusion length UniFuse catheter. Pulmonary manometry was performed. Next, over the more peripheral IJ access site, the micropuncture sheath was exchanged over a Wholey wire for a 6 French, 10 cm vascular sheath. A 5 French C2 catheter was inserted in used to select the left inferior lobar pulmonary artery. The catheter was exchanged for a 5 French, 90 cm, 10 cm infusion length UniFuse catheter. The catheters and sheath were affixed to the skin with interrupted 0 silk sutures and bandages. Thrombolysis was initiated. The  patient tolerated the procedure well was transferred back to the ICU in stable condition. FINDINGS: Right pulmonary artery pressure measured 61/29, mean 41 mmHg. Thrombolysis was initiated 0.5 mg tPA per hour via each catheter. IMPRESSION: Technically successful fluoroscopic guided initiation of bilateral pulmonary artery thrombolysis. Ester Sides, MD [facility] Radiology Electronically Signed   By: Ester Sides M.D.   On: 11/09/2023 05:25   IR Angiogram Pulmonary Bilateral Selective Result Date: 11/09/2023 INDICATION: 73 year old female with history of high risk pulmonary embolism with contraindication to iodinated contrast. EXAM: 1. Ultrasound-guided vascular access of the right internal jugular vein x2. 2. Selective catheterization of the bilateral pulmonary arteries. 3. Initiation of bilateral pulmonary arterial thrombolysis. COMPARISON:  11/08/2023 MEDICATIONS: None. ANESTHESIA/SEDATION: Moderate (conscious) sedation was employed during this procedure. A total of Versed  1 mg and Fentanyl  50 mcg was administered intravenously. Moderate Sedation Time: 28 minutes. The patient's level of consciousness and vital signs were monitored continuously by radiology nursing throughout the procedure under my direct supervision. FLUOROSCOPY TIME:  Twenty mGy reference air kerma CONTRAST:  None. COMPLICATIONS: None immediate. TECHNIQUE: Informed written consent was obtained from the patient after a thorough discussion of the procedural risks, benefits and alternatives. All questions were addressed. Maximal Sterile Barrier Technique was utilized including caps, mask, sterile gowns, sterile gloves, sterile drape, hand hygiene and skin antiseptic. A timeout was performed prior to the initiation of the procedure. Preprocedure ultrasound evaluation of the right internal jugular vein demonstrated patency. The procedure was planned. At approximately 1 cm apart, 2 separate sites  were planned for venous access. Subdermal Local anesthesia was administered at each planned access site. Small skin next from 8. Under direct ultrasound visualization, 2 separate micropuncture sticks were performed and images were saved sonographically in the permanent record. Micropuncture sheath were placed. Through the more central micropuncture, a Wholey wire was inserted over which the micropuncture sheath was exchanged for a 6 French, 10 cm vascular sheath. Over the wire, a 5 French C2 catheter was directed under fluoroscopic visualization to the right inferior lobar pulmonary artery. The catheter was exchanged for a 5 French, 90 cm, 10 cm infusion length UniFuse catheter. Pulmonary manometry was performed. Next, over the more peripheral IJ access site, the micropuncture sheath was exchanged over a Wholey wire for a  6 French, 10 cm vascular sheath. A 5 French C2 catheter was inserted in used to select the left inferior lobar pulmonary artery. The catheter was exchanged for a 5 French, 90 cm, 10 cm infusion length UniFuse catheter. The catheters and sheath were affixed to the skin with interrupted 0 silk sutures and bandages. Thrombolysis was initiated. The patient tolerated the procedure well was transferred back to the ICU in stable condition. FINDINGS: Right pulmonary artery pressure measured 61/29, mean 41 mmHg. Thrombolysis was initiated 0.5 mg tPA per hour via each catheter. IMPRESSION: Technically successful fluoroscopic guided initiation of bilateral pulmonary artery thrombolysis. Ester Sides, MD [facility] Radiology Electronically Signed   By: Ester Sides M.D.   On: 11/09/2023 05:25   IR Angiogram Selective Each Additional Vessel Result Date: 11/09/2023 INDICATION: 73 year old female with history of high risk pulmonary embolism with contraindication to iodinated contrast. EXAM: 1. Ultrasound-guided vascular access of the right internal jugular  vein x2. 2. Selective catheterization of the bilateral pulmonary arteries. 3. Initiation of bilateral pulmonary arterial thrombolysis. COMPARISON:  11/08/2023 MEDICATIONS: None. ANESTHESIA/SEDATION: Moderate (conscious) sedation was employed during this procedure. A total of Versed  1 mg and Fentanyl  50 mcg was administered intravenously. Moderate Sedation Time: 28 minutes. The patient's level of consciousness and vital signs were monitored continuously by radiology nursing throughout the procedure under my direct supervision. FLUOROSCOPY TIME:  Twenty mGy reference air kerma CONTRAST:  None. COMPLICATIONS: None immediate. TECHNIQUE: Informed written consent was obtained from the patient after a thorough discussion of the procedural risks, benefits and alternatives. All questions were addressed. Maximal Sterile Barrier Technique was utilized including caps, mask, sterile gowns, sterile gloves, sterile drape, hand hygiene and skin antiseptic. A timeout was performed prior to the initiation of the procedure. Preprocedure ultrasound evaluation of the right internal jugular vein demonstrated patency. The procedure was planned. At approximately 1 cm apart, 2 separate sites were planned for venous access. Subdermal Local anesthesia was administered at each planned access site. Small skin next from 8. Under direct ultrasound visualization, 2 separate micropuncture sticks were performed and images were saved sonographically in the permanent record. Micropuncture sheath were placed. Through the more central micropuncture, a Wholey wire was inserted over which the micropuncture sheath was exchanged for a 6 French, 10 cm vascular sheath. Over the wire, a 5 French C2 catheter was directed under fluoroscopic visualization to the right inferior lobar pulmonary artery. The catheter was exchanged for a 5 French, 90 cm, 10 cm infusion length UniFuse catheter. Pulmonary manometry was performed. Next, over the more peripheral IJ access  site, the micropuncture sheath was exchanged over a Wholey wire for a 6 French, 10 cm vascular sheath. A 5 French C2 catheter was inserted in used to select the left inferior lobar pulmonary artery. The catheter was exchanged for a 5 French, 90 cm, 10 cm infusion length UniFuse catheter. The catheters and sheath were affixed to the skin with interrupted 0 silk sutures and bandages. Thrombolysis was initiated. The patient tolerated the procedure well was transferred back to the ICU in stable condition. FINDINGS: Right pulmonary artery pressure measured 61/29, mean 41 mmHg. Thrombolysis was initiated 0.5 mg tPA per hour via each catheter. IMPRESSION: Technically successful fluoroscopic guided initiation of bilateral pulmonary artery thrombolysis. Ester Sides, MD [facility] Radiology Electronically Signed   By: Ester Sides M.D.   On: 11/09/2023 05:25   IR Angiogram Selective Each Additional Vessel Result Date: 11/09/2023 INDICATION: 73 year old female with  history of high risk pulmonary embolism with contraindication to iodinated contrast. EXAM: 1. Ultrasound-guided vascular access of the right internal jugular vein x2. 2. Selective catheterization of the bilateral pulmonary arteries. 3. Initiation of bilateral pulmonary arterial thrombolysis. COMPARISON:  11/08/2023 MEDICATIONS: None. ANESTHESIA/SEDATION: Moderate (conscious) sedation was employed during this procedure. A total of Versed  1 mg and Fentanyl  50 mcg was administered intravenously. Moderate Sedation Time: 28 minutes. The patient's level of consciousness and vital signs were monitored continuously by radiology nursing throughout the procedure under my direct supervision. FLUOROSCOPY TIME:  Twenty mGy reference air kerma CONTRAST:  None. COMPLICATIONS: None immediate. TECHNIQUE: Informed written consent was obtained from the patient after a thorough discussion of the procedural risks, benefits and  alternatives. All questions were addressed. Maximal Sterile Barrier Technique was utilized including caps, mask, sterile gowns, sterile gloves, sterile drape, hand hygiene and skin antiseptic. A timeout was performed prior to the initiation of the procedure. Preprocedure ultrasound evaluation of the right internal jugular vein demonstrated patency. The procedure was planned. At approximately 1 cm apart, 2 separate sites were planned for venous access. Subdermal Local anesthesia was administered at each planned access site. Small skin next from 8. Under direct ultrasound visualization, 2 separate micropuncture sticks were performed and images were saved sonographically in the permanent record. Micropuncture sheath were placed. Through the more central micropuncture, a Wholey wire was inserted over which the micropuncture sheath was exchanged for a 6 French, 10 cm vascular sheath. Over the wire, a 5 French C2 catheter was directed under fluoroscopic visualization to the right inferior lobar pulmonary artery. The catheter was exchanged for a 5 French, 90 cm, 10 cm infusion length UniFuse catheter. Pulmonary manometry was performed. Next, over the more peripheral IJ access site, the micropuncture sheath was exchanged over a Wholey wire for a 6 French, 10 cm vascular sheath. A 5 French C2 catheter was inserted in used to select the left inferior lobar pulmonary artery. The catheter was exchanged for a 5 French, 90 cm, 10 cm infusion length UniFuse catheter. The catheters and sheath were affixed to the skin with interrupted 0 silk sutures and bandages. Thrombolysis was initiated. The patient tolerated the procedure well was transferred back to the ICU in stable condition. FINDINGS: Right pulmonary artery pressure measured 61/29, mean 41 mmHg. Thrombolysis was initiated 0.5 mg tPA per hour via each catheter. IMPRESSION: Technically successful fluoroscopic guided initiation of bilateral pulmonary artery thrombolysis. Ester Sides, MD [facility] Radiology Electronically Signed   By: Ester Sides M.D.   On: 11/09/2023 05:25   IR US  Guide Vasc Access Right Result Date: 11/09/2023 INDICATION: 73 year old female with history of high risk pulmonary embolism with contraindication to iodinated contrast. EXAM: 1. Ultrasound-guided vascular access of the right internal jugular vein x2. 2. Selective catheterization of the bilateral pulmonary arteries. 3. Initiation of bilateral pulmonary arterial thrombolysis. COMPARISON:  11/08/2023 MEDICATIONS: None. ANESTHESIA/SEDATION: Moderate (conscious) sedation was employed during this procedure. A total of Versed  1 mg and Fentanyl  50 mcg was administered intravenously. Moderate Sedation Time: 28 minutes. The patient's level of consciousness and vital signs were monitored continuously by radiology nursing throughout the procedure under my direct supervision. FLUOROSCOPY TIME:  Twenty mGy reference air kerma CONTRAST:  None. COMPLICATIONS: None immediate. TECHNIQUE: Informed written consent was obtained from the patient after a thorough discussion of the procedural risks, benefits and alternatives. All questions were addressed. Maximal Sterile Barrier Technique was utilized including caps, mask, sterile gowns, sterile gloves, sterile drape,  hand hygiene and skin antiseptic. A timeout was performed prior to the initiation of the procedure. Preprocedure ultrasound evaluation of the right internal jugular vein demonstrated patency. The procedure was planned. At approximately 1 cm apart, 2 separate sites were planned for venous access. Subdermal Local anesthesia was administered at each planned access site. Small skin next from 8. Under direct ultrasound visualization, 2 separate micropuncture sticks were performed and images were saved sonographically in the permanent record. Micropuncture sheath were placed. Through the more central micropuncture, a  Wholey wire was inserted over which the micropuncture sheath was exchanged for a 6 French, 10 cm vascular sheath. Over the wire, a 5 French C2 catheter was directed under fluoroscopic visualization to the right inferior lobar pulmonary artery. The catheter was exchanged for a 5 French, 90 cm, 10 cm infusion length UniFuse catheter. Pulmonary manometry was performed. Next, over the more peripheral IJ access site, the micropuncture sheath was exchanged over a Wholey wire for a 6 French, 10 cm vascular sheath. A 5 French C2 catheter was inserted in used to select the left inferior lobar pulmonary artery. The catheter was exchanged for a 5 French, 90 cm, 10 cm infusion length UniFuse catheter. The catheters and sheath were affixed to the skin with interrupted 0 silk sutures and bandages. Thrombolysis was initiated. The patient tolerated the procedure well was transferred back to the ICU in stable condition. FINDINGS: Right pulmonary artery pressure measured 61/29, mean 41 mmHg. Thrombolysis was initiated 0.5 mg tPA per hour via each catheter. IMPRESSION: Technically successful fluoroscopic guided initiation of bilateral pulmonary artery thrombolysis. Ester Sides, MD [facility] Radiology Electronically Signed   By: Ester Sides M.D.   On: 11/09/2023 05:25   CT HEAD WO CONTRAST ( ) Result Date: 11/08/2023 EXAM: CT HEAD WITHOUT CONTRAST 11/08/2023 06:20:00 PM TECHNIQUE: CT of the head was performed without the administration of intravenous contrast. Automated exposure control, iterative reconstruction, and/or weight based adjustment of the mA/kV was utilized to reduce the radiation dose to as low as reasonably achievable. COMPARISON: 12/21/2022 CLINICAL HISTORY: starting lytic therapy. ); starting lytic therapy FINDINGS: BRAIN AND VENTRICLES: No acute hemorrhage. No evidence of acute infarct. No hydrocephalus. No extra-axial collection. No mass effect or midline  shift. Basal ganglia calcifications. Atherosclerotic calcifications in intracranial carotid and vertebral arteries. ORBITS: No acute abnormality. SINUSES: Mucosal thickening in ethmoid air cells and maxillary sinuses. Trace fluid in left mastoid air cells. SOFT TISSUES AND SKULL: No acute soft tissue abnormality. No skull fracture. IMPRESSION: 1. No acute intracranial abnormality. Electronically signed by: Lonni Necessary MD 11/08/2023 07:10 PM EDT RP Workstation: [id number]   VAS US  LOWER EXTREMITY VENOUS (DVT) Result Date: 11/08/2023  Lower Venous DVT Study Patient Name:  Vickie Collier  Date of Exam:   11/08/2023 Medical Rec #: [id number]        Accession #:    [id number] Date of Birth: 10/08/1950       Patient Gender: F Patient Age:   30 years Exam Location:  [facility] Procedure:      VAS US  LOWER EXTREMITY VENOUS (DVT) Referring Phys: TORIBIO SHARPS --------------------------------------------------------------------------------  Indications: Edema, and weakness, lightheadedness, H/O cancer, knee pain, unable to bear weight.  Limitations: Body habitus, poor ultrasound/tissue interface and Unable to properly position. Comparison Study: No prior exam. Performing Technologist: Edilia Elden Appl  Examination Guidelines: A complete evaluation includes B-mode imaging, spectral Doppler, color Doppler, and power Doppler as needed of all accessible portions of each vessel. Bilateral  testing is considered an integral part of a complete examination. Limited examinations for reoccurring indications may be performed as noted. The reflux portion of the exam is performed with the patient in reverse Trendelenburg.  +---------+---------------+---------+-----------+----------+-------------------+ RIGHT    CompressibilityPhasicitySpontaneityPropertiesThrombus Aging      +---------+---------------+---------+-----------+----------+-------------------+ CFV      Full           Yes      Yes                                       +---------+---------------+---------+-----------+----------+-------------------+ SFJ      Full           Yes      Yes                                      +---------+---------------+---------+-----------+----------+-------------------+ FV Prox  Full                                                             +---------+---------------+---------+-----------+----------+-------------------+ FV Mid   Full                                                             +---------+---------------+---------+-----------+----------+-------------------+ FV Distal               No       Yes                  Not well visualized +---------+---------------+---------+-----------+----------+-------------------+ PFV                     Yes      Yes                  Not well visualized +---------+---------------+---------+-----------+----------+-------------------+ POP                     Yes      Yes                  Not well visualized +---------+---------------+---------+-----------+----------+-------------------+ PTV      Full           No       No                                       +---------+---------------+---------+-----------+----------+-------------------+ PERO     Full                                                             +---------+---------------+---------+-----------+----------+-------------------+ Right profunda, distal femoral and popliteal veins are not well visualized due to patient's body habitus, but are patent with color and  doppler. Right posterior tibial veins are compressible but unable to fill one of the pair with color.  +---------+---------------+---------+-----------+----------+-------------------+ LEFT     CompressibilityPhasicitySpontaneityPropertiesThrombus Aging      +---------+---------------+---------+-----------+----------+-------------------+ CFV      Full           Yes      Yes                                       +---------+---------------+---------+-----------+----------+-------------------+ SFJ      Full           Yes      Yes                                      +---------+---------------+---------+-----------+----------+-------------------+ FV Prox  Full                                                             +---------+---------------+---------+-----------+----------+-------------------+ FV Mid   Full                                                             +---------+---------------+---------+-----------+----------+-------------------+ FV Distal               Yes      Yes                  Not well visualized +---------+---------------+---------+-----------+----------+-------------------+ PFV                     Yes      Yes                  Not well visualized +---------+---------------+---------+-----------+----------+-------------------+ POP                     Yes      Yes                  Not well visualized +---------+---------------+---------+-----------+----------+-------------------+ PTV      Full                                                             +---------+---------------+---------+-----------+----------+-------------------+ PERO     Full                                                             +---------+---------------+---------+-----------+----------+-------------------+ Soleal   None           No       No                                       +---------+---------------+---------+-----------+----------+-------------------+  Acute intramuscular thrombosis in the left soleal vein. Left profunda, distal femoral and popliteal veins are not well visualized due to patient's body habitus, but are patent with color and doppler. Left posterior tibial vein and peroneal veins are compressible but unable to fill with color.    Summary: RIGHT: - There is no evidence of deep vein thrombosis in the lower extremity. However,  portions of this examination were limited- see technologist comments above.  - No cystic structure found in the popliteal fossa.  LEFT: Findings consistent with acute intramuscular thrombosis involving the left soleal veins. - No cystic structure found in the popliteal fossa.  *See table(s) above for measurements and observations. Electronically signed by Fonda Rim on 11/08/2023 at 6:52:47 PM.    Final    NM Pulmonary Perfusion Result Date: 11/08/2023 EXAM: NM Lung Perfusion Scan. CLINICAL HISTORY: Heart failure, known or suspected, initial workup; concern for PE. TECHNIQUE: Radiolabeled MAA was administered intravenously and planar images of the lungs were obtained in multiple projections. RADIOPHARMACEUTICAL: 4.4 mCi Tc71m MAA via existing IV @1315  hb. COMPARISON: Chest radiograph 11/07/2023. FINDINGS: PERFUSION: Abnormal perfusion to both lungs with multiple large segmental perfusion defects. This includes a large right upper lobe defect as well as a large defect within the right lung base. Multiple moderate and large-sized perfusion defects are also noted within the upper and lower lobe of the left lung. In the appropriate clinical setting, imaging findings are compatible with pulmonary embolism. IMPRESSION: 1. Multiple large segmental perfusion defects in both lungs, including the right upper lobe and right lung base, as well as the left upper and lower lobes. There are no corresponding pulmonary opacities on the chest radiograph to account for this finding. . 2. Imaging findings are compatible with pulmonary embolism. 3. Critical results were called to the ordering provider at the time of interpretation on 11/08/2023. I personally spoke with Dr. Lenny Drought, who confirmed these findings. Electronically signed by: Waddell Calk MD 11/08/2023 03:49 PM EDT RP Workstation: [id number]   CARDIAC CATHETERIZATION Result Date: 11/08/2023 Pt on NE 6 and milrinone 0.25 Findings: RA = 13 RV = 59/14 PA =  68/30 (44) PCW = 9 Fick cardiac output/index = 7.4/3.5 TD CO/CI = 4.5/2.1 (more accurate) PVR = 9.2 WU Ao sat = 92% PA sat = 58%, 62% PAPi = 3.0 Assessment: 1. Severe PAH with cor pulmonale 2. Normal left-sided filling pressures Plan/Discussion: Suspect PE (unable to wedge catheter on right - suspect due to clot) Continue heparin. Proceed with VQ. Toribio Fuel, MD 3:10 PM  DG CHEST PORT 1 VIEW Result Date: 11/07/2023 CLINICAL DATA:  Malfunctioning central venous catheter EXAM: PORTABLE CHEST 1 VIEW COMPARISON:  11/06/2023 FINDINGS: Single frontal view of the chest demonstrates left internal jugular central venous catheter tip overlying the atriocaval junction. Cardiac silhouette is enlarged but stable. No acute airspace disease, effusion, or pneumothorax. No acute bony abnormalities. IMPRESSION: 1. Stable enlarged cardiac silhouette. 2. No acute airspace disease. Electronically Signed   By: Ozell Daring M.D.   On: 11/07/2023 16:26   ECHOCARDIOGRAM COMPLETE Result Date: 11/07/2023    ECHOCARDIOGRAM REPORT   Patient Name:   Vickie Collier Date of Exam: 11/07/2023 Medical Rec #:  [id number]       Height:       65.0 in Accession #:    [id number]      Weight:       265.0 lb Date of Birth:  Aug 24, 1950      BSA:  2.229 m Patient Age:    72 years        BP:           94/70 mmHg Patient Gender: F               HR:           60 bpm. Exam Location:  Inpatient Procedure: 2D Echo, Cardiac Doppler and Color Doppler (Both Spectral and Color            Flow Doppler were utilized during procedure). Indications:    Chest Pain R07.9 , NSTEMI I21.4  History:        Patient has no prior history of Echocardiogram examinations.  Sonographer:    Tinnie Gosling RDCS Referring Phys: [id number] ERIC J AUSTRIA IMPRESSIONS  1. Left ventricular ejection fraction, by estimation, is 35 to 40%. The left ventricle has moderately decreased function. The left ventricle demonstrates global hypokinesis. There is mild concentric left  ventricular hypertrophy. Left ventricular diastolic parameters are consistent with Grade I diastolic dysfunction (impaired relaxation). There is the interventricular septum is flattened in systole and diastole, consistent with right ventricular pressure and volume overload.  2. Right ventricular systolic function is severely reduced. The right ventricular size is moderately enlarged. There is moderately elevated pulmonary artery systolic pressure. The estimated right ventricular systolic pressure is 57.5 mmHg.  3. Right atrial size was severely dilated.  4. The mitral valve is normal in structure. Trivial mitral valve regurgitation. No evidence of mitral stenosis.  5. Tricuspid valve regurgitation is severe.  6. The aortic valve is tricuspid. There is mild calcification of the aortic valve. Aortic valve regurgitation is not visualized. Mild aortic valve stenosis. Aortic valve mean gradient measures 4.0 mmHg. Aortic valve Vmax measures 1.29 m/s.  7. The inferior vena cava is dilated in size with <50% respiratory variability, suggesting right atrial pressure of 15 mmHg. FINDINGS  Left Ventricle: Left ventricular ejection fraction, by estimation, is 35 to 40%. The left ventricle has moderately decreased function. The left ventricle demonstrates global hypokinesis. The left ventricular internal cavity size was normal in size. There is mild concentric left ventricular hypertrophy. The interventricular septum is flattened in systole and diastole, consistent with right ventricular pressure and volume overload. Left ventricular diastolic parameters are consistent with Grade I diastolic dysfunction (impaired relaxation). Right Ventricle: The right ventricular size is moderately enlarged. No increase in right ventricular wall thickness. Right ventricular systolic function is severely reduced. There is moderately elevated pulmonary artery systolic pressure. The tricuspid regurgitant velocity is 3.26 m/s, and with an assumed  right atrial pressure of 15 mmHg, the estimated right ventricular systolic pressure is 57.5 mmHg. Left Atrium: Left atrial size was normal in size. Right Atrium: Right atrial size was severely dilated. Pericardium: There is no evidence of pericardial effusion. Mitral Valve: The mitral valve is normal in structure. Mild mitral annular calcification. Trivial mitral valve regurgitation. No evidence of mitral valve stenosis. Tricuspid Valve: The tricuspid valve is normal in structure. Tricuspid valve regurgitation is severe. No evidence of tricuspid stenosis. Aortic Valve: The aortic valve is tricuspid. There is mild calcification of the aortic valve. Aortic valve regurgitation is not visualized. Mild aortic stenosis is present. Aortic valve mean gradient measures 4.0 mmHg. Aortic valve peak gradient measures  6.7 mmHg. Aortic valve area, by VTI measures 1.41 cm. Pulmonic Valve: The pulmonic valve was normal in structure. Pulmonic valve regurgitation is trivial. No evidence of pulmonic stenosis. Aorta: The aortic root is normal in size and  structure. Venous: The inferior vena cava is dilated in size with less than 50% respiratory variability, suggesting right atrial pressure of 15 mmHg. IAS/Shunts: There is left bowing of the interatrial septum, suggestive of elevated right atrial pressure. No atrial level shunt detected by color flow Doppler.  LEFT VENTRICLE PLAX 2D LVIDd:         3.90 cm   Diastology LVIDs:         3.20 cm   LV e' medial:    4.13 cm/s LV PW:         1.10 cm   LV E/e' medial:  9.4 LV IVS:        1.00 cm   LV e' lateral:   3.70 cm/s LVOT diam:     2.00 cm   LV E/e' lateral: 10.5 LV SV:         31 LV SV Index:   14 LVOT Area:     3.14 cm  RIGHT VENTRICLE            IVC RV S prime:     9.25 cm/s  IVC diam: 2.30 cm TAPSE (M-mode): 0.8 cm LEFT ATRIUM           Index        RIGHT ATRIUM           Index LA diam:      3.00 cm 1.35 cm/m   RA Area:     32.10 cm LA Vol (A4C): 32.0 ml 14.35 ml/m  RA Volume:    144.00 ml 64.59 ml/m  AORTIC VALVE AV Area (Vmax):    1.67 cm AV Area (Vmean):   1.62 cm AV Area (VTI):     1.41 cm AV Vmax:           129.00 cm/s AV Vmean:          88.500 cm/s AV VTI:            0.223 m AV Peak Grad:      6.7 mmHg AV Mean Grad:      4.0 mmHg LVOT Vmax:         68.50 cm/s LVOT Vmean:        45.500 cm/s LVOT VTI:          0.100 m LVOT/AV VTI ratio: 0.45  AORTA Ao Root diam: 2.70 cm Ao Asc diam:  3.50 cm MITRAL VALVE               TRICUSPID VALVE MV Area (PHT): 2.11 cm    TR Peak grad:   42.5 mmHg MV Decel Time: 359 msec    TR Mean grad:   29.0 mmHg MV E velocity: 39.00 cm/s  TR Vmax:        326.00 cm/s MV A velocity: 48.00 cm/s  TR Vmean:       254.0 cm/s MV E/A ratio:  0.81                            SHUNTS                            Systemic VTI:  0.10 m                            Systemic Diam: 2.00 cm Toribio Fuel MD Electronically signed by Toribio Fuel MD Signature Date/Time: 11/07/2023/12:17:10  PM    Final    DG Chest Port 1 View Result Date: 11/06/2023 CLINICAL DATA:  Shortness of breath. EXAM: PORTABLE CHEST 1 VIEW COMPARISON:  Chest radiograph dated 11/02/2023 FINDINGS: No focal consolidation, pleural effusion or pneumothorax. Stable cardiomegaly. No acute osseous pathology. Degenerative changes of the spine and shoulders. IMPRESSION: 1. No active disease. 2. Cardiomegaly. Electronically Signed   By: Vanetta Chou M.D.   On: 11/06/2023 17:53   DG Chest Port 1 View Result Date: 11/02/2023 EXAM: 1 VIEW(S) XRAY OF THE CHEST 11/02/2023 11:29:00 PM COMPARISON: 12/21/2022 CLINICAL HISTORY: sob. Pt c/o dizziness since one day last week with occasional SOB. Pt reports hx HTN, compliant with medications. FINDINGS: LUNGS AND PLEURA: No focal pulmonary opacity. No pulmonary edema. No pleural effusion. No pneumothorax. HEART AND MEDIASTINUM: Cardiomegaly. No acute abnormality of the mediastinal silhouette. BONES AND SOFT TISSUES: Surgical clips in RIGHT breast. No acute osseous  abnormality. JOINTS: Severe degenerative changes of LEFT shoulder. Mild degenerative changes of RIGHT shoulder. IMPRESSION: 1. No acute cardiopulmonary abnormality. 2. Cardiomegaly. Electronically signed by: [place] Gatlin MD 11/02/2023 11:45 PM EDT RP Workstation: [id number]    Microbiology: Results for orders placed or performed during the hospital encounter of 11/06/23  Resp panel by RT-PCR (RSV, Flu A&B, Covid) Anterior Nasal Swab     Status: None   Collection Time: 11/06/23  5:33 PM   Specimen: Anterior Nasal Swab  Result Value Ref Range Status   SARS Coronavirus 2 by RT PCR NEGATIVE NEGATIVE Final    Comment: (NOTE) SARS-CoV-2 target nucleic acids are NOT DETECTED.  The SARS-CoV-2 RNA is generally detectable in upper respiratory specimens during the acute phase of infection. The lowest concentration of SARS-CoV-2 viral copies this assay can detect is 138 copies/mL. A negative result does not preclude SARS-Cov-2 infection and should not be used as the sole basis for treatment or other patient management decisions. A negative result may occur with  improper specimen collection/handling, submission of specimen other than nasopharyngeal swab, presence of viral mutation(s) within the areas targeted by this assay, and inadequate number of viral copies(<138 copies/mL). A negative result must be combined with clinical observations, patient history, and epidemiological information. The expected result is Negative.  Fact Sheet for Patients:  bloggercourse.com  Fact Sheet for Healthcare Providers:  seriousbroker.it  This test is no t yet approved or cleared by the [place]  FDA and  has been authorized for detection and/or diagnosis of SARS-CoV-2 by FDA under an Emergency Use Authorization (EUA). This EUA will remain  in effect (meaning this test can be used) for the duration of the COVID-19 declaration under Section 564(b)(1) of the  Act, 21 U.S.C.section 360bbb-3(b)(1), unless the authorization is terminated  or revoked sooner.       Influenza A by PCR NEGATIVE NEGATIVE Final   Influenza B by PCR NEGATIVE NEGATIVE Final    Comment: (NOTE) The Xpert Xpress SARS-CoV-2/FLU/RSV plus assay is intended as an aid in the diagnosis of influenza from Nasopharyngeal swab specimens and should not be used as a sole basis for treatment. Nasal washings and aspirates are unacceptable for Xpert Xpress SARS-CoV-2/FLU/RSV testing.  Fact Sheet for Patients: bloggercourse.com  Fact Sheet for Healthcare Providers: seriousbroker.it  This test is not yet approved or cleared by the [place]  FDA and has been authorized for detection and/or diagnosis of SARS-CoV-2 by FDA under an Emergency Use Authorization (EUA). This EUA will remain in effect (meaning this test can be used) for the duration of the COVID-19 declaration  under Section 564(b)(1) of the Act, 21 U.S.C. section 360bbb-3(b)(1), unless the authorization is terminated or revoked.     Resp Syncytial Virus by PCR NEGATIVE NEGATIVE Final    Comment: (NOTE) Fact Sheet for Patients: bloggercourse.com  Fact Sheet for Healthcare Providers: seriousbroker.it  This test is not yet approved or cleared by the [place]  FDA and has been authorized for detection and/or diagnosis of SARS-CoV-2 by FDA under an Emergency Use Authorization (EUA). This EUA will remain in effect (meaning this test can be used) for the duration of the COVID-19 declaration under Section 564(b)(1) of the Act, 21 U.S.C. section 360bbb-3(b)(1), unless the authorization is terminated or revoked.  Performed at [facility], [address], [place], [place] [address]   Urine Culture (for pregnant, neutropenic or urologic patients or patients with an indwelling urinary catheter)      Status: Abnormal   Collection Time: 11/07/23  8:16 AM   Specimen: Urine, Clean Catch  Result Value Ref Range Status   Specimen Description   Final    URINE, CLEAN CATCH Performed at [facility], [address], [place], [place] [address]    Special Requests   Final    NONE Performed at [facility], [address] [place], [place] [address]    Culture (A)  Final    >=100,000 COLONIES/mL LACTOBACILLUS SPECIES Standardized susceptibility testing for this organism is not available. Performed at [facility], [address] [place], [place] [address]    Report Status 11/08/2023 FINAL  Final  MRSA Next Gen by PCR, Nasal     Status: None   Collection Time: 11/07/23  2:43 PM   Specimen: Nasal Mucosa; Nasal Swab  Result Value Ref Range Status   MRSA by PCR Next Gen NOT DETECTED NOT DETECTED Final    Comment: (NOTE) The GeneXpert MRSA Assay (FDA approved for NASAL specimens only), is one component of a comprehensive MRSA colonization surveillance program. It is not intended to diagnose MRSA infection nor to guide or monitor treatment for MRSA infections. Test performance is not FDA approved in patients less than 60 years old. Performed at [facility], [address], [place], [place] [address]   Culture, blood (Routine X 2) w Reflex to ID Panel     Status: None   Collection Time: 11/07/23  3:08 PM   Specimen: BLOOD LEFT HAND  Result Value Ref Range Status   Specimen Description BLOOD LEFT HAND  Final   Special Requests   Final    BOTTLES DRAWN AEROBIC ONLY Blood Culture results may not be optimal due to an inadequate volume of blood received in culture bottles   Culture   Final    NO GROWTH 5 DAYS Performed at [facility], [address] [place], [place] [address]    Report Status 11/12/2023 FINAL  Final  Culture, blood (Routine X 2) w Reflex to ID Panel     Status: None   Collection Time: 11/07/23 10:39 PM   Specimen:  BLOOD LEFT HAND  Result Value Ref Range Status   Specimen Description BLOOD LEFT HAND  Final   Special Requests   Final    BOTTLES DRAWN AEROBIC AND ANAEROBIC Blood Culture adequate volume   Culture   Final    NO GROWTH 5 DAYS Performed at [facility], [address], [place], [place] [address]    Report Status 11/12/2023 FINAL  Final    Labs:  CBC: Recent Labs  Lab 11/11/23 0615 11/12/23 0650 11/13/23 0445 11/14/23 0430 11/15/23 0324  WBC 5.3 5.2 5.3 5.8 5.6  HGB 7.9* 8.3* 9.0* 9.3* 9.9*  HCT 24.9* 26.5* 29.0* 29.6* 31.3*  MCV 82.5 82.3 82.6 81.8 81.9  PLT 133* 146* 180 193 221   Basic Metabolic Panel: Recent Labs  Lab 11/09/23 0549 11/10/23 0453 11/11/23 0615 11/12/23 0650 11/13/23 0445 11/14/23 0430 11/15/23 0324  NA 137 137 138 137 137 138 136  K 4.0 4.3 4.5 4.5 4.7 4.7 4.4  CL 104 103 104 103 101 99 97*  CO2 22 23 22 23 26 26 24   GLUCOSE 111* 112* 102* 91 81 85 104*  BUN 36* 36* 38* 34* 35* 46* 57*  CREATININE 2.22* 1.98* 1.80* 1.47* 1.40* 1.70* 1.85*  CALCIUM 8.5* 8.5* 8.7* 8.8* 9.2 8.9 9.3  MG 1.7 2.4 2.1 1.9  --   --   --    Liver Function Tests: Recent Labs  Lab 11/09/23 0549 11/10/23 0453  AST 49* 32  ALT 18 15  ALKPHOS 45 50  BILITOT 0.9 0.8  PROT 6.6 6.4*  ALBUMIN 2.8* 2.6*   CBG: No results for input(s): GLUCAP in the last 168 hours.  Discharge time spent: greater than 30 minutes.  Signed: Elidia Toribio Furnace, MD [facility] 11/15/2023

## 2023-11-15 NOTE — Care Management Important Message (Signed)
 Important Message  Patient Details  Name: Vickie Collier MRN: [id number] Date of Birth: April 09, 1950   Important Message Given:  Yes - Medicare IM     [name] 11/15/2023, 1:04 PM

## 2023-11-15 NOTE — Progress Notes (Signed)
 Report called to [facility] RN patient transferred to [facility] by [facility]

## 2023-11-15 NOTE — Discharge Instructions (Addendum)
 Information on my medicine - ELIQUIS (apixaban)   Why was Eliquis prescribed for you? Eliquis was prescribed to treat blood clots that may have been found in the veins of your legs (deep vein thrombosis) or in your lungs (pulmonary embolism) and to reduce the risk of them occurring again.  What do You need to know about Eliquis ? The starting dose is 10 mg (two 5 mg tablets) taken TWICE daily for the next 4 DAYS,  the dose is reduced to ONE 5 mg tablet taken TWICE daily.  Eliquis may be taken with or without food.   Try to take the dose about the same time in the morning and in the evening. If you have difficulty swallowing the tablet whole please discuss with your pharmacist how to take the medication safely.  Take Eliquis exactly as prescribed and DO NOT stop taking Eliquis without talking to the doctor who prescribed the medication.  Stopping may increase your risk of developing a new blood clot.  Refill your prescription before you run out.  After discharge, you should have regular check-up appointments with your healthcare provider that is prescribing your Eliquis.    What do you do if you miss a dose? If a dose of ELIQUIS is not taken at the scheduled time, take it as soon as possible on the same day and twice-daily administration should be resumed. The dose should not be doubled to make up for a missed dose.  Important Safety Information A possible side effect of Eliquis is bleeding. You should call your healthcare provider right away if you experience any of the following: Bleeding from an injury or your nose that does not stop. Unusual colored urine (red or dark brown) or unusual colored stools (red or black). Unusual bruising for unknown reasons. A serious fall or if you hit your head (even if there is no bleeding).  Some medicines may interact with Eliquis and might increase your risk of bleeding or clotting while on Eliquis. To help avoid this, consult your healthcare  provider or pharmacist prior to using any new prescription or non-prescription medications, including herbals, vitamins, non-steroidal anti-inflammatory drugs (NSAIDs) and supplements.  This website has more information on Eliquis (apixaban): http://www.eliquis.com/eliquis/home

## 2023-11-15 NOTE — TOC Transition Note (Addendum)
 Transition of Care [facility]) - Discharge Note   Patient Details  Name: Vickie Collier MRN: [id number] Date of Birth: [birth year]  [facility] CM/SW Contact:  Arlana JINNY Nicholaus ISRAEL Phone Number: [phone] 11/15/2023, 9:58 AM   Clinical Narrative:    Shara approved for [facility] 11/14/2023-11/18/2023  9:46 AM- HF CSW called and spoke with patients son to explain SNF process. CSW explained to him that the patients shara was approved for [facility] and that she is medically ready for dc. CSW explained that the patient will be transported by [facility] to the facility.  CSW received a call from Tammy That [facility] is on isolation and cannot offer any beds at this time. [facility] has a bed available. CSW called and updated patients family. Agreeable to [employer] and switching auth.   CSW tried to switch patients facility on auth and [facility] is down. Will try to switch facility [facility] later when [facility] is back up.    1:13 PM- [facility] called.   CSW placed dc summary paperwork on chart. Notified bedside RN via secure chat patients room number and number for report.   Room #121B [phone]        Barriers to Discharge: Continued Medical Work up   Patient Goals and CMS Choice   CMS Medicare.gov Compare Post Acute Care list provided to:: Patient Represenative (must comment) Choice offered to / list presented to : Adult Children      Discharge Placement                       Discharge Plan and Services Additional resources added to the After Visit Summary for     Discharge Planning Services: CM Consult Post Acute Care Choice: IP Rehab                               Social Drivers of Health (SDOH) Interventions SDOH Screenings   Food Insecurity: Patient Unable To Answer (11/08/2023)  Housing: Unknown (11/08/2023)  Transportation Needs: Patient Unable To Answer (11/08/2023)  Utilities: Patient Unable To Answer (11/08/2023)  Financial  Resource Strain: Low Risk  (02/10/2019)   [facility] from [facility]  Physical Activity: Insufficiently Active (08/28/2017)   Received from [facility]  Social Connections: Patient Unable To Answer (11/08/2023)  Stress: No Stress Concern Present (08/28/2017)   Received from [facility]  Tobacco Use: Low Risk  (11/08/2023)     Readmission Risk Interventions     No data to display

## 2023-11-18 DIAGNOSIS — I2699 Other pulmonary embolism without acute cor pulmonale: Secondary | ICD-10-CM | POA: Diagnosis not present

## 2023-11-18 DIAGNOSIS — N1832 Chronic kidney disease, stage 3b: Secondary | ICD-10-CM | POA: Diagnosis not present

## 2023-11-18 DIAGNOSIS — E119 Type 2 diabetes mellitus without complications: Secondary | ICD-10-CM | POA: Diagnosis not present

## 2023-11-18 DIAGNOSIS — D509 Iron deficiency anemia, unspecified: Secondary | ICD-10-CM | POA: Diagnosis not present

## 2023-11-18 DIAGNOSIS — I2510 Atherosclerotic heart disease of native coronary artery without angina pectoris: Secondary | ICD-10-CM | POA: Diagnosis not present

## 2023-11-18 DIAGNOSIS — I5022 Chronic systolic (congestive) heart failure: Secondary | ICD-10-CM | POA: Diagnosis not present

## 2023-11-18 DIAGNOSIS — I480 Paroxysmal atrial fibrillation: Secondary | ICD-10-CM | POA: Diagnosis not present

## 2023-11-18 DIAGNOSIS — I130 Hypertensive heart and chronic kidney disease with heart failure and stage 1 through stage 4 chronic kidney disease, or unspecified chronic kidney disease: Secondary | ICD-10-CM | POA: Diagnosis not present

## 2023-11-18 DIAGNOSIS — C50911 Malignant neoplasm of unspecified site of right female breast: Secondary | ICD-10-CM | POA: Diagnosis not present

## 2023-11-19 DIAGNOSIS — I5022 Chronic systolic (congestive) heart failure: Secondary | ICD-10-CM | POA: Diagnosis not present

## 2023-11-19 DIAGNOSIS — I214 Non-ST elevation (NSTEMI) myocardial infarction: Secondary | ICD-10-CM | POA: Diagnosis not present

## 2023-11-19 DIAGNOSIS — I130 Hypertensive heart and chronic kidney disease with heart failure and stage 1 through stage 4 chronic kidney disease, or unspecified chronic kidney disease: Secondary | ICD-10-CM | POA: Diagnosis not present

## 2023-11-19 DIAGNOSIS — I2699 Other pulmonary embolism without acute cor pulmonale: Secondary | ICD-10-CM | POA: Diagnosis not present

## 2023-11-20 DIAGNOSIS — C50911 Malignant neoplasm of unspecified site of right female breast: Secondary | ICD-10-CM | POA: Diagnosis not present

## 2023-11-20 DIAGNOSIS — I2699 Other pulmonary embolism without acute cor pulmonale: Secondary | ICD-10-CM | POA: Diagnosis not present

## 2023-11-20 DIAGNOSIS — N1832 Chronic kidney disease, stage 3b: Secondary | ICD-10-CM | POA: Diagnosis not present

## 2023-11-20 DIAGNOSIS — D509 Iron deficiency anemia, unspecified: Secondary | ICD-10-CM | POA: Diagnosis not present

## 2023-11-20 DIAGNOSIS — E785 Hyperlipidemia, unspecified: Secondary | ICD-10-CM | POA: Diagnosis not present

## 2023-11-20 DIAGNOSIS — E119 Type 2 diabetes mellitus without complications: Secondary | ICD-10-CM | POA: Diagnosis not present

## 2023-11-20 DIAGNOSIS — I2510 Atherosclerotic heart disease of native coronary artery without angina pectoris: Secondary | ICD-10-CM | POA: Diagnosis not present

## 2023-11-20 DIAGNOSIS — I130 Hypertensive heart and chronic kidney disease with heart failure and stage 1 through stage 4 chronic kidney disease, or unspecified chronic kidney disease: Secondary | ICD-10-CM | POA: Diagnosis not present

## 2023-11-20 DIAGNOSIS — I5022 Chronic systolic (congestive) heart failure: Secondary | ICD-10-CM | POA: Diagnosis not present

## 2023-11-22 DIAGNOSIS — I480 Paroxysmal atrial fibrillation: Secondary | ICD-10-CM | POA: Diagnosis not present

## 2023-11-22 DIAGNOSIS — R2681 Unsteadiness on feet: Secondary | ICD-10-CM | POA: Diagnosis not present

## 2023-11-22 DIAGNOSIS — I10 Essential (primary) hypertension: Secondary | ICD-10-CM | POA: Diagnosis not present

## 2023-11-22 DIAGNOSIS — I5021 Acute systolic (congestive) heart failure: Secondary | ICD-10-CM | POA: Diagnosis not present

## 2023-11-22 DIAGNOSIS — N1832 Chronic kidney disease, stage 3b: Secondary | ICD-10-CM | POA: Diagnosis not present

## 2023-11-22 DIAGNOSIS — I2699 Other pulmonary embolism without acute cor pulmonale: Secondary | ICD-10-CM | POA: Diagnosis not present

## 2023-11-22 DIAGNOSIS — D509 Iron deficiency anemia, unspecified: Secondary | ICD-10-CM | POA: Diagnosis not present

## 2023-11-22 DIAGNOSIS — C50411 Malignant neoplasm of upper-outer quadrant of right female breast: Secondary | ICD-10-CM | POA: Diagnosis not present

## 2023-11-22 DIAGNOSIS — I82402 Acute embolism and thrombosis of unspecified deep veins of left lower extremity: Secondary | ICD-10-CM | POA: Diagnosis not present

## 2023-11-22 DIAGNOSIS — M6281 Muscle weakness (generalized): Secondary | ICD-10-CM | POA: Diagnosis not present

## 2023-11-22 DIAGNOSIS — N3946 Mixed incontinence: Secondary | ICD-10-CM | POA: Diagnosis not present

## 2023-11-26 DIAGNOSIS — R570 Cardiogenic shock: Secondary | ICD-10-CM | POA: Diagnosis not present

## 2023-11-26 DIAGNOSIS — R2681 Unsteadiness on feet: Secondary | ICD-10-CM | POA: Diagnosis not present

## 2023-11-26 DIAGNOSIS — C50411 Malignant neoplasm of upper-outer quadrant of right female breast: Secondary | ICD-10-CM | POA: Diagnosis not present

## 2023-11-26 DIAGNOSIS — I5021 Acute systolic (congestive) heart failure: Secondary | ICD-10-CM | POA: Diagnosis not present

## 2023-11-26 DIAGNOSIS — E785 Hyperlipidemia, unspecified: Secondary | ICD-10-CM | POA: Diagnosis not present

## 2023-11-26 DIAGNOSIS — D509 Iron deficiency anemia, unspecified: Secondary | ICD-10-CM | POA: Diagnosis not present

## 2023-11-26 DIAGNOSIS — E119 Type 2 diabetes mellitus without complications: Secondary | ICD-10-CM | POA: Diagnosis not present

## 2023-11-26 DIAGNOSIS — I10 Essential (primary) hypertension: Secondary | ICD-10-CM | POA: Diagnosis not present

## 2023-11-26 DIAGNOSIS — M6281 Muscle weakness (generalized): Secondary | ICD-10-CM | POA: Diagnosis not present

## 2023-11-26 DIAGNOSIS — I480 Paroxysmal atrial fibrillation: Secondary | ICD-10-CM | POA: Diagnosis not present

## 2023-11-26 DIAGNOSIS — N183 Chronic kidney disease, stage 3 unspecified: Secondary | ICD-10-CM | POA: Diagnosis not present

## 2023-11-26 DIAGNOSIS — I2699 Other pulmonary embolism without acute cor pulmonale: Secondary | ICD-10-CM | POA: Diagnosis not present

## 2023-11-27 ENCOUNTER — Telehealth (HOSPITAL_COMMUNITY): Payer: Self-pay

## 2023-11-27 NOTE — Progress Notes (Signed)
 ADVANCED HF CLINIC CONSULT NOTE  Referring Physician: Benjamine Aland, MD Primary Care: Benjamine Aland, MD Primary Cardiologist: Dr. Cherrie  HPI: Vickie Collier is a 73 y.o. female with history of HTN, HLD, breast cancer s/p radiation on letrozole , GERD, arthritis, and CKD IIIb.    Seen in ED 10/18 with shortness of breath with exertion. She was not having chest pain but does report palpitations HS troponin 46>32. T wave inversions noted on ECG. Admission for workup recommended but patient declined.    Represented to [facility] 10/25 with presyncope. sCr >2, LA 3.9 with elevated trops. She was started on heparin for ACS concern. Echo w/ EF 35-40%, septal flattening, severely reduced RV, RA severely dilated, severe TR. She was started on milrinone and norepinephrine for CGS shock. 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 V/Q + for PE. She underwent catheter directed t-pa on 10/24. Repeat echo 10/25 LVEF 60-65% with improved RV function and McConnell's sign. She was weaned from milrinone. Course also complicated by runs of atrial fibrillation and UTI.    She returns today for RV failure follow up. Overall feeling well. NYHA III, limited mostly by deconditioning. At [facility]. Arrived by wheelchair, working with inpatient rehab team, improving. Reports dyspnea with exertion. Denies chest pain, fatigue, palpitations, dizziness, and abnormal bleeding. Able to perform ADLs. Appetite okay. Weight at home down 5 lbs. BP at home SBP 130s. Compliant with all medications.  Past Medical History:  Diagnosis Date   Arthritis    Atypical ductal hyperplasia of right breast    Breast cancer (HCC) 01/25/2021   Family history of ovarian cancer    Family history of pancreatic cancer    Family history of prostate cancer    Family history of stomach cancer    GERD (gastroesophageal reflux disease)    Hyperlipidemia    Hypertension    Personal history of radiation therapy     Current  Outpatient Medications  Medication Sig Dispense Refill   acetaminophen  (TYLENOL ) 325 MG tablet Take 2 tablets (650 mg total) by mouth every 6 (six) hours as needed for moderate pain (pain score 4-6) or mild pain (pain score 1-3) (pain).     amLODipine (NORVASC) 5 MG tablet Take 1 tablet (5 mg total) by mouth daily. 30 tablet 0   apixaban (ELIQUIS) 5 MG TABS tablet Take 2 tablets (10 mg total) by mouth 2 (two) times daily for 4 days, THEN 1 tablet (5 mg total) 2 (two) times daily. Take two tablets twice daily until 11/18/23, then on 11/19/23 start taking one tablet twice daily. 76 tablet 0   cholecalciferol (VITAMIN D3) 25 MCG (1000 UNIT) tablet Take 1,000 Units by mouth daily.     cyanocobalamin 100 MCG tablet Take 100 mcg by mouth daily.     letrozole  (FEMARA ) 2.5 MG tablet Take 1 tablet (2.5 mg total) by mouth daily. 90 tablet 3   omeprazole (PRILOSEC) 40 MG capsule Take 40 mg by mouth 2 (two) times daily as needed.     polyethylene glycol (MIRALAX / GLYCOLAX) 17 g packet Take 17 g by mouth 2 (two) times daily. 14 each 0   rosuvastatin (CRESTOR) 5 MG tablet Take 5 mg by mouth daily.     torsemide (DEMADEX) 20 MG tablet Take 1 tablet (20 mg total) by mouth daily. 30 tablet 0   traMADol HCl 25 MG TABS Take 25 mg by mouth 2 (two) times daily as needed (Pain).     oxyCODONE -acetaminophen  (  PERCOCET/ROXICET) 5-325 MG tablet Take 1 tablet by mouth every 8 (eight) hours as needed for severe pain (pain score 7-10). (Patient not taking: Reported on 11/28/2023) 10 tablet 0   No current facility-administered medications for this encounter.    Allergies  Allergen Reactions   Lisinopril Cough      Social History   Socioeconomic History   Marital status: Single    Spouse name: Not on file   Number of children: Not on file   Years of education: Not on file   Highest education level: Not on file  Occupational History   Not on file  Tobacco Use   Smoking status: Never   Smokeless tobacco: Never   Substance and Sexual Activity   Alcohol use: Not Currently   Drug use: Never   Sexual activity: Not Currently    Birth control/protection: Surgical  Other Topics Concern   Not on file  Social History Narrative   Not on file   Social Drivers of Health   Financial Resource Strain: Low Risk  (02/10/2019)   Received from [facility] ([facility]   Overall Financial Resource Strain (CARDIA)    Difficulty of Paying Living Expenses: Not hard at all  Food Insecurity: Patient Unable To Answer (11/08/2023)   Hunger Vital Sign    Worried About Running Out of Food in the Last Year: Patient unable to answer    Ran Out of Food in the Last Year: Patient unable to answer  Transportation Needs: Patient Unable To Answer (11/08/2023)   PRAPARE - Transportation    Lack of Transportation (Medical): Patient unable to answer    Lack of Transportation (Non-Medical): Patient unable to answer  Physical Activity: Insufficiently Active (08/28/2017)   Received from [facility]   Exercise Vital Sign    Days of Exercise per Week: 7 days    Minutes of Exercise per Session: 20 min  Stress: No Stress Concern Present (08/28/2017)   Received from [facility]   [employer] of Occupational Health - Occupational Stress Questionnaire    Feeling of Stress : Not at all  Social Connections: Patient Unable To Answer (11/08/2023)   Social Connection and Isolation Panel    Frequency of Communication with Friends and Family: Patient unable to answer    Frequency of Social Gatherings with Friends and Family: Patient unable to answer    Attends Religious Services: Patient unable to answer    Active Member of Clubs or Organizations: Patient unable to answer    Attends Banker Meetings: Patient unable to answer    Marital Status: Patient unable to answer  Intimate Partner Violence: Patient Unable To Answer (11/08/2023)   Humiliation, Afraid, Rape, and  Kick questionnaire    Fear of Current or Ex-Partner: Patient unable to answer    Emotionally Abused: Patient unable to answer    Physically Abused: Patient unable to answer    Sexually Abused: Patient unable to answer      Family History  Problem Relation Age of Onset   Lupus Father    Ovarian cancer Maternal Grandmother 60   Prostate cancer Half-Brother 56   Pancreatic cancer Cousin        paternal first cousin   Stomach cancer Cousin        paternal first cousin   Lung cancer Cousin    Cancer Cousin        NOS   Vitals:   11/28/23 1437  BP: 114/62  Pulse: 86  SpO2: 96%  Weight: 100.1 kg (220 lb 9.6 oz)  Height: 5' 5 (1.651 m)    Filed Weights   11/28/23 1437  Weight: 100.1 kg (220 lb 9.6 oz)    PHYSICAL EXAM: General: Well appearing. No distress  Cardiac: JVP flat. No murmurs  Abdomen: Soft, non-distended.  Extremities: Warm and dry.  No peripheral edema.  Neuro: A&O x3. Affect pleasant.   ECG (personally reviewed): SR with a PAC 87 bpm  ASSESSMENT & PLAN:  Massive Pulmonary Embolism with Cor Pulmonale  - Initial echo w EF 35-40%, septal flattening, severely reduced RV, severe TR - VQ 10/24 + bilateral PE - RHC 10/24 (NE 6 + milrinone 0.25): RA 13, PA 68/30 (44), PCW 9, CO/CI TD 4.5/2.1 - s/p catheter directed t-PA on 10/24 - Echo 10/25: EF 60-65%. Mild-mod residual RV dysfunction with McConnell's sign, RVSP 60mmHG  - CTA chest 10/27: multiple lobar and segmental PEs in both lungs without central embolism; main PA normal in size. Per IR was not amendable to mechanical thrombectomy.  - continue eliquis 5 mg bid - continue torsemide 20 mg daily   - repeat echo in 2 months at follow up   DVT - US  + for LLE intramuscular thrombus involving soleal veins - on eliquis   CKD 3b - sCr baseline around 1.3 - last Cr 1.85; BMET today   Anemia - last hgb 9.9 - tsat 6; given IV Fe - denies bleeding/melena; CBC today   Paroxysmal Atrial flutter -  intermittent 2/2 PE - suspect probable OSA (h/o snoring) - schedule outpatient sleep study once out of rehab - in SR on ECG today    Follow up in 2 months with APP  Tashica Provencio, NP 11/28/23

## 2023-11-27 NOTE — Telephone Encounter (Signed)
 Called to confirm/remind patient of their appointment at the [facility] on 11/28/23.   Appointment:   [x] Confirmed  [] Left mess   [] No answer/No voice mail  [] VM Full/unable to leave message  [] Phone not in service  Patient reminded to bring all medications and/or complete list.  Confirmed patient has transportation. Gave directions, instructed to utilize valet parking.

## 2023-11-28 ENCOUNTER — Ambulatory Visit (HOSPITAL_COMMUNITY)
Admit: 2023-11-28 | Discharge: 2023-11-28 | Disposition: A | Discharge status: Home / Self Care | Source: Ambulatory Visit | Attending: Cardiology | Admitting: Cardiology

## 2023-11-28 ENCOUNTER — Encounter (HOSPITAL_COMMUNITY): Payer: Self-pay

## 2023-11-28 ENCOUNTER — Ambulatory Visit (HOSPITAL_COMMUNITY): Payer: Self-pay | Admitting: Cardiology

## 2023-11-28 VITALS — BP 114/62 | HR 86 | Ht 65.0 in | Wt 220.6 lb

## 2023-11-28 DIAGNOSIS — I129 Hypertensive chronic kidney disease with stage 1 through stage 4 chronic kidney disease, or unspecified chronic kidney disease: Secondary | ICD-10-CM | POA: Insufficient documentation

## 2023-11-28 DIAGNOSIS — E785 Hyperlipidemia, unspecified: Secondary | ICD-10-CM | POA: Diagnosis not present

## 2023-11-28 DIAGNOSIS — R0609 Other forms of dyspnea: Secondary | ICD-10-CM | POA: Insufficient documentation

## 2023-11-28 DIAGNOSIS — I4892 Unspecified atrial flutter: Secondary | ICD-10-CM | POA: Insufficient documentation

## 2023-11-28 DIAGNOSIS — Z86711 Personal history of pulmonary embolism: Secondary | ICD-10-CM | POA: Diagnosis not present

## 2023-11-28 DIAGNOSIS — Z86718 Personal history of other venous thrombosis and embolism: Secondary | ICD-10-CM | POA: Diagnosis not present

## 2023-11-28 DIAGNOSIS — I824Y9 Acute embolism and thrombosis of unspecified deep veins of unspecified proximal lower extremity: Secondary | ICD-10-CM | POA: Diagnosis not present

## 2023-11-28 DIAGNOSIS — N1832 Chronic kidney disease, stage 3b: Secondary | ICD-10-CM | POA: Insufficient documentation

## 2023-11-28 DIAGNOSIS — I071 Rheumatic tricuspid insufficiency: Secondary | ICD-10-CM | POA: Diagnosis not present

## 2023-11-28 DIAGNOSIS — I2699 Other pulmonary embolism without acute cor pulmonale: Secondary | ICD-10-CM | POA: Diagnosis not present

## 2023-11-28 DIAGNOSIS — I4891 Unspecified atrial fibrillation: Secondary | ICD-10-CM | POA: Insufficient documentation

## 2023-11-28 DIAGNOSIS — Z79899 Other long term (current) drug therapy: Secondary | ICD-10-CM | POA: Diagnosis not present

## 2023-11-28 DIAGNOSIS — K219 Gastro-esophageal reflux disease without esophagitis: Secondary | ICD-10-CM | POA: Insufficient documentation

## 2023-11-28 DIAGNOSIS — D649 Anemia, unspecified: Secondary | ICD-10-CM | POA: Diagnosis not present

## 2023-11-28 DIAGNOSIS — Z7901 Long term (current) use of anticoagulants: Secondary | ICD-10-CM | POA: Insufficient documentation

## 2023-11-28 DIAGNOSIS — D631 Anemia in chronic kidney disease: Secondary | ICD-10-CM | POA: Diagnosis not present

## 2023-11-28 DIAGNOSIS — M1990 Unspecified osteoarthritis, unspecified site: Secondary | ICD-10-CM | POA: Insufficient documentation

## 2023-11-28 DIAGNOSIS — I2781 Cor pulmonale (chronic): Secondary | ICD-10-CM | POA: Insufficient documentation

## 2023-11-28 DIAGNOSIS — I5021 Acute systolic (congestive) heart failure: Secondary | ICD-10-CM | POA: Diagnosis not present

## 2023-11-28 LAB — BASIC METABOLIC PANEL WITH GFR
Anion gap: 18 — ABNORMAL HIGH (ref 5–15)
BUN: 52 mg/dL — ABNORMAL HIGH (ref 8–23)
CO2: 27 mmol/L (ref 22–32)
Calcium: 9.8 mg/dL (ref 8.9–10.3)
Chloride: 97 mmol/L — ABNORMAL LOW (ref 98–111)
Creatinine, Ser: 1.78 mg/dL — ABNORMAL HIGH (ref 0.44–1.00)
GFR, Estimated: 30 mL/min — ABNORMAL LOW (ref >60)
Glucose, Bld: 104 mg/dL — ABNORMAL HIGH (ref 70–99)
Potassium: 4 mmol/L (ref 3.5–5.1)
Sodium: 142 mmol/L (ref 135–145)

## 2023-11-28 LAB — CBC
HCT: 37.2 % (ref 36.0–46.0)
Hemoglobin: 11.4 g/dL — ABNORMAL LOW (ref 12.0–15.0)
MCH: 26.1 pg (ref 26.0–34.0)
MCHC: 30.6 g/dL (ref 30.0–36.0)
MCV: 85.1 fL (ref 80.0–100.0)
Platelets: 335 K/uL (ref 150–400)
RBC: 4.37 MIL/uL (ref 3.87–5.11)
RDW: 18.1 % — ABNORMAL HIGH (ref 11.5–15.5)
WBC: 5 K/uL (ref 4.0–10.5)
nRBC: 0 % (ref 0.0–0.2)

## 2023-11-28 LAB — BRAIN NATRIURETIC PEPTIDE: B Natriuretic Peptide: 79.1 pg/mL (ref 0.0–100.0)

## 2023-11-28 NOTE — Addendum Note (Signed)
 Encounter addended by: Marjo Grosvenor M, RN on: 11/28/2023 3:14 PM  Actions taken: Charge Capture section accepted

## 2023-11-28 NOTE — Patient Instructions (Addendum)
 Good to see you today!  Your physician has requested that you have an echocardiogram. Echocardiography is a painless test that uses sound waves to create images of your heart. It provides your doctor with information about the size and shape of your heart and how well your heart's chambers and valves are working. This procedure takes approximately one hour. There are no restrictions for this procedure. Please do NOT wear cologne, perfume, aftershave, or lotions (deodorant is allowed). Please arrive 15 minutes prior to your appointment time.  Please note: We ask at that you not bring children with you during ultrasound (echo/ vascular) testing. Due to room size and safety concerns, children are not allowed in the ultrasound rooms during exams. Our front office staff cannot provide observation of children in our lobby area while testing is being conducted. An adult accompanying a patient to their appointment will only be allowed in the ultrasound room at the discretion of the ultrasound technician under special circumstances. We apologize for any inconvenience.  Labs done today, your results will be available in MyChart, we will contact you for abnormal readings.  Your physician recommends that you schedule a follow-up appointment  as scheduled  If you have any questions or concerns before your next appointment please send us  a message through [place] or call our office at [phone].    TO LEAVE A MESSAGE FOR THE NURSE SELECT OPTION 2, PLEASE LEAVE A MESSAGE INCLUDING: YOUR NAME DATE OF BIRTH CALL BACK NUMBER REASON FOR CALL**this is important as we prioritize the call backs  YOU WILL RECEIVE A CALL BACK THE SAME DAY AS LONG AS YOU CALL BEFORE 4:00 PM At the [facility], you and your health needs are our priority. As part of our continuing mission to provide you with exceptional heart care, we have created designated Provider Care Teams. These Care Teams include your primary  Cardiologist (physician) and Advanced Practice Providers (APPs- Physician Assistants and Nurse Practitioners) who all work together to provide you with the care you need, when you need it.   You may see any of the following providers on your designated Care Team at your next follow up: Dr Toribio Fuel Dr Ezra Shuck Dr. Morene Brownie Greig Mosses, NP Caffie Shed, [place] [facility], [place] Beckey Coe, NP Jordan Lee, NP Ellouise Class, NP Tinnie Redman, PharmD Jaun Bash, PharmD   Please be sure to bring in all your medications bottles to every appointment.    Thank you for choosing [facility]

## 2023-11-29 DIAGNOSIS — I5021 Acute systolic (congestive) heart failure: Secondary | ICD-10-CM | POA: Diagnosis not present

## 2023-11-29 DIAGNOSIS — I2699 Other pulmonary embolism without acute cor pulmonale: Secondary | ICD-10-CM | POA: Diagnosis not present

## 2023-11-29 DIAGNOSIS — M6281 Muscle weakness (generalized): Secondary | ICD-10-CM | POA: Diagnosis not present

## 2023-11-29 DIAGNOSIS — C50411 Malignant neoplasm of upper-outer quadrant of right female breast: Secondary | ICD-10-CM | POA: Diagnosis not present

## 2023-11-29 DIAGNOSIS — D509 Iron deficiency anemia, unspecified: Secondary | ICD-10-CM | POA: Diagnosis not present

## 2023-11-29 DIAGNOSIS — R2681 Unsteadiness on feet: Secondary | ICD-10-CM | POA: Diagnosis not present

## 2023-11-29 DIAGNOSIS — I480 Paroxysmal atrial fibrillation: Secondary | ICD-10-CM | POA: Diagnosis not present

## 2023-12-04 DIAGNOSIS — I5021 Acute systolic (congestive) heart failure: Secondary | ICD-10-CM | POA: Diagnosis not present

## 2023-12-04 DIAGNOSIS — I2699 Other pulmonary embolism without acute cor pulmonale: Secondary | ICD-10-CM | POA: Diagnosis not present

## 2023-12-04 DIAGNOSIS — M170 Bilateral primary osteoarthritis of knee: Secondary | ICD-10-CM | POA: Diagnosis not present

## 2023-12-10 DIAGNOSIS — I2699 Other pulmonary embolism without acute cor pulmonale: Secondary | ICD-10-CM | POA: Diagnosis not present

## 2023-12-10 DIAGNOSIS — M6281 Muscle weakness (generalized): Secondary | ICD-10-CM | POA: Diagnosis not present

## 2023-12-10 DIAGNOSIS — I5021 Acute systolic (congestive) heart failure: Secondary | ICD-10-CM | POA: Diagnosis not present

## 2023-12-10 DIAGNOSIS — I480 Paroxysmal atrial fibrillation: Secondary | ICD-10-CM | POA: Diagnosis not present

## 2023-12-10 DIAGNOSIS — R2681 Unsteadiness on feet: Secondary | ICD-10-CM | POA: Diagnosis not present

## 2023-12-10 DIAGNOSIS — D509 Iron deficiency anemia, unspecified: Secondary | ICD-10-CM | POA: Diagnosis not present

## 2023-12-10 DIAGNOSIS — C50411 Malignant neoplasm of upper-outer quadrant of right female breast: Secondary | ICD-10-CM | POA: Diagnosis not present

## 2023-12-18 DIAGNOSIS — I5021 Acute systolic (congestive) heart failure: Secondary | ICD-10-CM | POA: Diagnosis not present

## 2023-12-18 DIAGNOSIS — D509 Iron deficiency anemia, unspecified: Secondary | ICD-10-CM | POA: Diagnosis not present

## 2023-12-18 DIAGNOSIS — R2681 Unsteadiness on feet: Secondary | ICD-10-CM | POA: Diagnosis not present

## 2023-12-18 DIAGNOSIS — I480 Paroxysmal atrial fibrillation: Secondary | ICD-10-CM | POA: Diagnosis not present

## 2023-12-18 DIAGNOSIS — C50411 Malignant neoplasm of upper-outer quadrant of right female breast: Secondary | ICD-10-CM | POA: Diagnosis not present

## 2023-12-18 DIAGNOSIS — M6281 Muscle weakness (generalized): Secondary | ICD-10-CM | POA: Diagnosis not present

## 2023-12-18 DIAGNOSIS — I2699 Other pulmonary embolism without acute cor pulmonale: Secondary | ICD-10-CM | POA: Diagnosis not present

## 2023-12-20 DIAGNOSIS — I214 Non-ST elevation (NSTEMI) myocardial infarction: Secondary | ICD-10-CM | POA: Diagnosis not present

## 2023-12-20 DIAGNOSIS — N1832 Chronic kidney disease, stage 3b: Secondary | ICD-10-CM | POA: Diagnosis not present

## 2023-12-20 DIAGNOSIS — D509 Iron deficiency anemia, unspecified: Secondary | ICD-10-CM | POA: Diagnosis not present

## 2023-12-20 DIAGNOSIS — I480 Paroxysmal atrial fibrillation: Secondary | ICD-10-CM | POA: Diagnosis not present

## 2023-12-20 DIAGNOSIS — I2699 Other pulmonary embolism without acute cor pulmonale: Secondary | ICD-10-CM | POA: Diagnosis not present

## 2023-12-20 DIAGNOSIS — M6281 Muscle weakness (generalized): Secondary | ICD-10-CM | POA: Diagnosis not present

## 2023-12-20 DIAGNOSIS — C50411 Malignant neoplasm of upper-outer quadrant of right female breast: Secondary | ICD-10-CM | POA: Diagnosis not present

## 2023-12-20 DIAGNOSIS — I10 Essential (primary) hypertension: Secondary | ICD-10-CM | POA: Diagnosis not present

## 2023-12-20 DIAGNOSIS — R2681 Unsteadiness on feet: Secondary | ICD-10-CM | POA: Diagnosis not present

## 2023-12-20 DIAGNOSIS — D508 Other iron deficiency anemias: Secondary | ICD-10-CM | POA: Diagnosis not present

## 2023-12-20 DIAGNOSIS — I5021 Acute systolic (congestive) heart failure: Secondary | ICD-10-CM | POA: Diagnosis not present

## 2023-12-24 DIAGNOSIS — I2699 Other pulmonary embolism without acute cor pulmonale: Secondary | ICD-10-CM | POA: Diagnosis not present

## 2023-12-24 DIAGNOSIS — I480 Paroxysmal atrial fibrillation: Secondary | ICD-10-CM | POA: Diagnosis not present

## 2023-12-24 DIAGNOSIS — C50411 Malignant neoplasm of upper-outer quadrant of right female breast: Secondary | ICD-10-CM | POA: Diagnosis not present

## 2023-12-24 DIAGNOSIS — R2681 Unsteadiness on feet: Secondary | ICD-10-CM | POA: Diagnosis not present

## 2023-12-24 DIAGNOSIS — M6281 Muscle weakness (generalized): Secondary | ICD-10-CM | POA: Diagnosis not present

## 2023-12-24 DIAGNOSIS — D509 Iron deficiency anemia, unspecified: Secondary | ICD-10-CM | POA: Diagnosis not present

## 2023-12-24 DIAGNOSIS — I5021 Acute systolic (congestive) heart failure: Secondary | ICD-10-CM | POA: Diagnosis not present

## 2024-01-27 NOTE — Progress Notes (Signed)
 "  ADVANCED HF CLINIC NOTE  Primary Care: Benjamine Aland, MD HF Cardiologist: Dr. Cherrie  HPI: Vickie Collier is a 74 y.o. female with history of HTN, HLD, breast cancer s/p radiation on letrozole , GERD, arthritis, and CKD IIIb.    Seen in ED 11/02/23 with shortness of breath with exertion. She was not having chest pain but does report palpitations HS troponin 46>32. T wave inversions noted on ECG. Admission for workup recommended but patient declined.    Admitted 10/25 with presyncope. sCr >2, LA 3.9 with elevated trops. She was started on heparin  for ACS concern. Echo showed EF 35-40%, septal flattening, severely reduced RV, RA severely dilated, severe TR. She was started on milrinone  and NE for CGS shock. RHC showed RV failure with low cardiac output. V/Q + for PE. She underwent catheter directed t-pa. Repeat echo 11/09/23 showed EF 60-65% with improved RV function and McConnell's sign. Drips were weaned Course also complicated by runs of atrial fibrillation and UTI. She was discharged to SNF, weight 238 lbs.  Today she returns for HF follow up with her son. Overall feeling fine. She has been home from SNF for a few days. Has started working with PT using a walker. She is able to complete ALDs, she has mild SOB with this.  She lives with her daughter. Denies palpitations, abnormal bleeding, CP, dizziness, edema, or PND/Orthopnea. Chronically sleeps in recliner. Appetite ok. Taking all medications. She snores.  Echo today 01/28/24 shows EF improving, not yet normalized. Images reviewed with Dr. Zenaida  Cardiac Studies  - Echo 11/09/23: EF 60-65%, interventricular septum is flattened in systole and diastole, RV moderately reduced, PASP 60.4 mmHg, mild to moderate TR  - RHC 10/25: (on NE 6 and milrinone  0.25) RA 13, PA 68/30 (44), PCWP 9, CO/CI (TD, more accurate) 4.5/2.1, PVR 9.2 WU, PAPi 3  - Echo 11/07/23: EF 35-40%, G1DD, interventricular septum is flattened in systole and diastole; RV  severely reduced, PASP 57.5 mmHg, severe TR   Past Medical History:  Diagnosis Date   Arthritis    Atypical ductal hyperplasia of right breast    Breast cancer (HCC) 01/25/2021   Family history of ovarian cancer    Family history of pancreatic cancer    Family history of prostate cancer    Family history of stomach cancer    GERD (gastroesophageal reflux disease)    Hyperlipidemia    Hypertension    Personal history of radiation therapy    Current Outpatient Medications  Medication Sig Dispense Refill   acetaminophen  (TYLENOL ) 325 MG tablet Take 2 tablets (650 mg total) by mouth every 6 (six) hours as needed for moderate pain (pain score 4-6) or mild pain (pain score 1-3) (pain).     apixaban  (ELIQUIS ) 5 MG TABS tablet Take 1 tablet (5 mg total) by mouth 2 (two) times daily. 60 tablet 6   cholecalciferol (VITAMIN D3) 25 MCG (1000 UNIT) tablet Take 1,000 Units by mouth daily.     cyanocobalamin 100 MCG tablet Take 100 mcg by mouth daily.     furosemide  (LASIX ) 20 MG tablet Take 1 tablet (20 mg total) by mouth daily. 30 tablet 11   furosemide  (LASIX ) 20 MG tablet Take 20 mg by mouth daily.     letrozole  (FEMARA ) 2.5 MG tablet Take 1 tablet (2.5 mg total) by mouth daily. 90 tablet 3   omeprazole (PRILOSEC) 40 MG capsule Take 40 mg by mouth 2 (two) times daily as needed.     oxyCODONE -acetaminophen  (  PERCOCET/ROXICET) 5-325 MG tablet Take 1 tablet by mouth every 8 (eight) hours as needed for severe pain (pain score 7-10). 10 tablet 0   polyethylene glycol (MIRALAX  / GLYCOLAX ) 17 g packet Take 17 g by mouth 2 (two) times daily. 14 each 0   pregabalin  (LYRICA ) 100 MG capsule Take 100 mg by mouth daily as needed.     tizanidine (ZANAFLEX) 2 MG capsule Take 2 mg by mouth in the morning and at bedtime.     traMADol HCl 25 MG TABS Take 25 mg by mouth 2 (two) times daily as needed (Pain).     amLODipine  (NORVASC ) 5 MG tablet Take 1 tablet (5 mg total) by mouth daily. 30 tablet 0   rosuvastatin   (CRESTOR ) 5 MG tablet Take 1 tablet (5 mg total) by mouth daily. 90 tablet 3   spironolactone  (ALDACTONE ) 25 MG tablet Take 1 tablet (25 mg total) by mouth daily. 90 tablet 2   No current facility-administered medications for this encounter.   Allergies  Allergen Reactions   Lisinopril Cough   Social History   Socioeconomic History   Marital status: Single    Spouse name: Not on file   Number of children: Not on file   Years of education: Not on file   Highest education level: Not on file  Occupational History   Not on file  Tobacco Use   Smoking status: Never   Smokeless tobacco: Never  Substance and Sexual Activity   Alcohol use: Not Currently   Drug use: Never   Sexual activity: Not Currently    Birth control/protection: Surgical  Other Topics Concern   Not on file  Social History Narrative   Not on file   Social Drivers of Health   Tobacco Use: Low Risk (01/28/2024)   Patient History    Smoking Tobacco Use: Never    Smokeless Tobacco Use: Never    Passive Exposure: Not on file  Financial Resource Strain: Not on file  Food Insecurity: Patient Unable To Answer (11/08/2023)   Epic    Worried About Programme Researcher, Broadcasting/film/video in the Last Year: Patient unable to answer    Ran Out of Food in the Last Year: Patient unable to answer  Transportation Needs: Patient Unable To Answer (11/08/2023)   Epic    Lack of Transportation (Medical): Patient unable to answer    Lack of Transportation (Non-Medical): Patient unable to answer  Physical Activity: Not on file  Stress: Not on file  Social Connections: Patient Unable To Answer (11/08/2023)   Social Connection and Isolation Panel    Frequency of Communication with Friends and Family: Patient unable to answer    Frequency of Social Gatherings with Friends and Family: Patient unable to answer    Attends Religious Services: Patient unable to answer    Active Member of Clubs or Organizations: Patient unable to answer    Attends Occupational Hygienist Meetings: Patient unable to answer    Marital Status: Patient unable to answer  Intimate Partner Violence: Patient Unable To Answer (11/08/2023)   Epic    Fear of Current or Ex-Partner: Patient unable to answer    Emotionally Abused: Patient unable to answer    Physically Abused: Patient unable to answer    Sexually Abused: Patient unable to answer  Depression (EYV7-0): Not on file  Alcohol Screen: Not on file  Housing: Unknown (11/08/2023)   Epic    Unable to Pay for Housing in the Last Year: Patient unable to  answer    Number of Times Moved in the Last Year: 0    Homeless in the Last Year: Patient unable to answer  Utilities: Patient Unable To Answer (11/08/2023)   Epic    Threatened with loss of utilities: Patient unable to answer  Health Literacy: Not on file   Family History  Problem Relation Age of Onset   Lupus Father    Ovarian cancer Maternal Grandmother 49   Prostate cancer Half-Brother 12   Pancreatic cancer Cousin        paternal first cousin   Stomach cancer Cousin        paternal first cousin   Lung cancer Cousin    Cancer Cousin        NOS   Wt Readings from Last 3 Encounters:  01/28/24 101.6 kg (224 lb)  11/28/23 100.1 kg (220 lb 9.6 oz)  11/15/23 108.1 kg (238 lb 5.1 oz)   BP 102/64   Pulse 94   Ht 5' 5 (1.651 m)   Wt 101.6 kg (224 lb)   SpO2 97%   BMI 37.28 kg/m   PHYSICAL EXAM: General:  NAD. No resp difficulty, arrived in [facility], chronically-ill appearing. HEENT: Normal Neck: Supple. No JVD. Cor: Regular rate & rhythm. No rubs, gallops or murmurs. Lungs: Clear Abdomen: Obese,  nontender, nondistended.  Extremities: No cyanosis, clubbing, rash, edema Neuro: Alert & oriented x 3, moves all 4 extremities w/o difficulty. Affect pleasant.  ASSESSMENT & PLAN:  Pulmonary Embolism with Cor Pulmonale  - Initial echo w EF 35-40%, septal flattening, severely reduced RV, severe TR - VQ 10/24 + bilateral PE - RHC 10/25: (NE 6 +  milrinone  0.25): RA 13, PA 68/30 (44), PCW 9, CO/CI TD 4.5/2.1 - s/p catheter directed t-PA on 11/08/23 - Echo 11/09/23: EF 60-65%. Mild-mod residual RV dysfunction with McConnell's sign, RVSP 60mmHG  - CTA chest 11/11/23: multiple lobar and segmental PEs in both lungs without central embolism; main PA normal in size. Per IR was not amendable to mechanical thrombectomy.  - Echo today 01/28/24, EF improving. Reviewed images with Dr. Zenaida. - Continue Eliquis  5 mg bid, no bleeding issues. - Continue Lasix  20 mg daily   - Continue spironolactone  25 mg daily. - Consider SGLT2i when mobility improves - Labs today.   2. DVT - US  + for LLE intramuscular thrombus involving soleal veins - On Eliquis    3. CKD 3b - Baseline SCr 1.3 - BMET today   4. Anemia - tsat 6; given IV Fe during recent admission - No bleeding issues   5. Paroxysmal Atrial flutter - intermittent 2/2 PE - suspect probable OSA (h/o snoring) - Regular on exam today - Continue Eliquis   6. Snoring - suspect OSA - Arrange home sleep study (mobility limited)  7. Debility - Continue HH PT    Follow up in 3-4 months with Dr. Cherrie.  Vickie HERO Brusly, FNP 01/28/2024  "

## 2024-01-28 ENCOUNTER — Ambulatory Visit (HOSPITAL_COMMUNITY)
Admission: RE | Admit: 2024-01-28 | Discharge: 2024-01-28 | Disposition: A | Source: Ambulatory Visit | Attending: *Deleted | Admitting: *Deleted

## 2024-01-28 ENCOUNTER — Telehealth (HOSPITAL_COMMUNITY): Payer: Self-pay

## 2024-01-28 ENCOUNTER — Encounter (HOSPITAL_COMMUNITY): Payer: Self-pay

## 2024-01-28 ENCOUNTER — Other Ambulatory Visit: Payer: Self-pay

## 2024-01-28 ENCOUNTER — Ambulatory Visit: Payer: Self-pay | Admitting: Pharmacist

## 2024-01-28 ENCOUNTER — Other Ambulatory Visit (HOSPITAL_COMMUNITY): Payer: Self-pay

## 2024-01-28 VITALS — BP 102/64 | HR 94 | Ht 65.0 in | Wt 224.0 lb

## 2024-01-28 DIAGNOSIS — N1832 Chronic kidney disease, stage 3b: Secondary | ICD-10-CM | POA: Insufficient documentation

## 2024-01-28 DIAGNOSIS — Z923 Personal history of irradiation: Secondary | ICD-10-CM | POA: Insufficient documentation

## 2024-01-28 DIAGNOSIS — I824Y9 Acute embolism and thrombosis of unspecified deep veins of unspecified proximal lower extremity: Secondary | ICD-10-CM | POA: Diagnosis not present

## 2024-01-28 DIAGNOSIS — I129 Hypertensive chronic kidney disease with stage 1 through stage 4 chronic kidney disease, or unspecified chronic kidney disease: Secondary | ICD-10-CM | POA: Diagnosis not present

## 2024-01-28 DIAGNOSIS — I2781 Cor pulmonale (chronic): Secondary | ICD-10-CM

## 2024-01-28 DIAGNOSIS — Z79899 Other long term (current) drug therapy: Secondary | ICD-10-CM | POA: Insufficient documentation

## 2024-01-28 DIAGNOSIS — I071 Rheumatic tricuspid insufficiency: Secondary | ICD-10-CM | POA: Insufficient documentation

## 2024-01-28 DIAGNOSIS — R0683 Snoring: Secondary | ICD-10-CM | POA: Insufficient documentation

## 2024-01-28 DIAGNOSIS — I4892 Unspecified atrial flutter: Secondary | ICD-10-CM | POA: Insufficient documentation

## 2024-01-28 DIAGNOSIS — I272 Pulmonary hypertension, unspecified: Secondary | ICD-10-CM | POA: Insufficient documentation

## 2024-01-28 DIAGNOSIS — R5381 Other malaise: Secondary | ICD-10-CM

## 2024-01-28 DIAGNOSIS — Z7901 Long term (current) use of anticoagulants: Secondary | ICD-10-CM | POA: Insufficient documentation

## 2024-01-28 DIAGNOSIS — I2609 Other pulmonary embolism with acute cor pulmonale: Secondary | ICD-10-CM | POA: Diagnosis not present

## 2024-01-28 DIAGNOSIS — Z86718 Personal history of other venous thrombosis and embolism: Secondary | ICD-10-CM | POA: Insufficient documentation

## 2024-01-28 DIAGNOSIS — D649 Anemia, unspecified: Secondary | ICD-10-CM | POA: Insufficient documentation

## 2024-01-28 DIAGNOSIS — R0602 Shortness of breath: Secondary | ICD-10-CM | POA: Insufficient documentation

## 2024-01-28 LAB — BASIC METABOLIC PANEL WITH GFR
Anion gap: 11 (ref 5–15)
BUN: 26 mg/dL — ABNORMAL HIGH (ref 8–23)
CO2: 25 mmol/L (ref 22–32)
Calcium: 10.1 mg/dL (ref 8.9–10.3)
Chloride: 107 mmol/L (ref 98–111)
Creatinine, Ser: 1.06 mg/dL — ABNORMAL HIGH (ref 0.44–1.00)
GFR, Estimated: 55 mL/min — ABNORMAL LOW
Glucose, Bld: 92 mg/dL (ref 70–99)
Potassium: 4.7 mmol/L (ref 3.5–5.1)
Sodium: 144 mmol/L (ref 135–145)

## 2024-01-28 LAB — PRO BRAIN NATRIURETIC PEPTIDE: Pro Brain Natriuretic Peptide: 512 pg/mL — ABNORMAL HIGH

## 2024-01-28 MED ORDER — FUROSEMIDE 20 MG PO TABS
20.0000 mg | ORAL_TABLET | Freq: Every day | ORAL | 11 refills | Status: AC
  Filled 2024-01-28 – 2024-02-10 (×2): qty 30, 30d supply, fill #0

## 2024-01-28 MED ORDER — ROSUVASTATIN CALCIUM 5 MG PO TABS
5.0000 mg | ORAL_TABLET | Freq: Every day | ORAL | 3 refills | Status: AC
  Filled 2024-01-28 – 2024-02-10 (×2): qty 90, 90d supply, fill #0
  Filled 2024-02-11: qty 30, 30d supply, fill #0

## 2024-01-28 MED ORDER — AMLODIPINE BESYLATE 5 MG PO TABS
5.0000 mg | ORAL_TABLET | Freq: Every day | ORAL | 0 refills | Status: AC
  Filled 2024-01-28 – 2024-02-10 (×2): qty 30, 30d supply, fill #0

## 2024-01-28 MED ORDER — SPIRONOLACTONE 25 MG PO TABS
25.0000 mg | ORAL_TABLET | Freq: Every day | ORAL | 2 refills | Status: AC
  Filled 2024-01-28 (×2): qty 90, 90d supply, fill #0

## 2024-01-28 MED ORDER — APIXABAN 5 MG PO TABS
5.0000 mg | ORAL_TABLET | Freq: Two times a day (BID) | ORAL | 6 refills | Status: AC
  Filled 2024-01-28 – 2024-02-10 (×2): qty 60, 30d supply, fill #0

## 2024-01-28 MED ORDER — TORSEMIDE 20 MG PO TABS
20.0000 mg | ORAL_TABLET | Freq: Every day | ORAL | 0 refills | Status: DC
  Filled 2024-01-28: qty 30, 30d supply, fill #0
  Filled ????-??-??: fill #0

## 2024-01-28 NOTE — Patient Instructions (Addendum)
 Good to see you today!  Labs done today, your results will be available in MyChart, we will contact you for abnormal readings.  Your provider has recommended that you have a home sleep study (Itamar Test).  We have provided you with the equipment in our office today. Please go ahead and download the app. DO NOT OPEN OR TAMPER WITH THE BOX UNTIL WE ADVISE YOU TO DO SO. Once insurance has approved the test our office will call you with PIN number and approval to proceed with testing. Once you have completed the test you just dispose of the equipment, the information is automatically uploaded to us  via blue-tooth technology. If your test is positive for sleep apnea and you need a home CPAP machine you will be contacted by Dr Dorine office [facility]) to set this up.   Your physician recommends that you schedule a follow-up appointment 4 months(May) call office in march to schedule an appointment  If you have any questions or concerns before your next appointment please send us  a message through [place] or call our office at [phone].    TO LEAVE A MESSAGE FOR THE NURSE SELECT OPTION 2, PLEASE LEAVE A MESSAGE INCLUDING: YOUR NAME DATE OF BIRTH CALL BACK NUMBER REASON FOR CALL**this is important as we prioritize the call backs  YOU WILL RECEIVE A CALL BACK THE SAME DAY AS LONG AS YOU CALL BEFORE 4:00 PM At the [facility], you and your health needs are our priority. As part of our continuing mission to provide you with exceptional heart care, we have created designated Provider Care Teams. These Care Teams include your primary Cardiologist (physician) and Advanced Practice Providers (APPs- Physician Assistants and Nurse Practitioners) who all work together to provide you with the care you need, when you need it.   You may see any of the following providers on your designated Care Team at your next follow up: Dr Toribio Fuel Dr Ezra Shuck Dr. Morene Brownie Greig Mosses, NP Caffie Shed, [place] [facility], [place] Beckey Coe, NP Jordan Lee, NP Ellouise Class, NP Tinnie Redman, PharmD Jaun Bash, PharmD   Please be sure to bring in all your medications bottles to every appointment.    Thank you for choosing [place] [facility]

## 2024-01-28 NOTE — Telephone Encounter (Signed)
 Called to confirm/remind patient of their appointment at the [facility] on 11/18/23.   Appointment:   [] Confirmed  [] Left mess   [x] No answer/No voice mail  [] VM Full/unable to leave message  [] Phone not in service

## 2024-01-29 ENCOUNTER — Ambulatory Visit (HOSPITAL_COMMUNITY): Payer: Self-pay | Admitting: Family Medicine

## 2024-01-29 ENCOUNTER — Encounter (HOSPITAL_COMMUNITY): Payer: Self-pay

## 2024-01-30 ENCOUNTER — Ambulatory Visit: Payer: Self-pay | Admitting: Pharmacist

## 2024-01-30 ENCOUNTER — Other Ambulatory Visit: Payer: Self-pay

## 2024-01-31 LAB — ECHOCARDIOGRAM COMPLETE
AR max vel: 1.17 cm2
AV Area VTI: 1.25 cm2
AV Area mean vel: 1.26 cm2
AV Mean grad: 6.5 mmHg
AV Peak grad: 13.7 mmHg
Ao pk vel: 1.85 m/s
Est EF: 45
S' Lateral: 2.9 cm

## 2024-02-04 ENCOUNTER — Telehealth (HOSPITAL_COMMUNITY): Payer: Self-pay

## 2024-02-04 NOTE — Telephone Encounter (Signed)
No precert required 

## 2024-02-04 NOTE — Telephone Encounter (Signed)
-----   Message from Nurse Keene MATSU, RN sent at 01/28/2024  4:02 PM EST ----- Regarding: Itamar  Patient given Itamar  ?pre cert needed  Thank  you   Keene

## 2024-02-04 NOTE — Telephone Encounter (Signed)
-----   Message from Nurse Keene MATSU, RN sent at 02/03/2024  2:41 PM EST ----- Itamar given Pre cert needed?

## 2024-02-06 ENCOUNTER — Telehealth (HOSPITAL_COMMUNITY): Payer: Self-pay

## 2024-02-06 NOTE — Telephone Encounter (Signed)
 Left voice  message regarding Verneda that she can proceed with study no pre certification needed.

## 2024-02-10 ENCOUNTER — Other Ambulatory Visit (HOSPITAL_COMMUNITY): Payer: Self-pay

## 2024-02-11 ENCOUNTER — Other Ambulatory Visit (HOSPITAL_COMMUNITY): Payer: Self-pay

## 2024-02-11 ENCOUNTER — Other Ambulatory Visit: Payer: Self-pay

## 2024-02-11 ENCOUNTER — Telehealth (HOSPITAL_COMMUNITY): Payer: Self-pay

## 2024-02-11 NOTE — Telephone Encounter (Signed)
 Patients daughter made aware that patient can proceed with sleep study. Pin number given.

## 2024-02-12 ENCOUNTER — Other Ambulatory Visit: Payer: Self-pay

## 2024-02-13 ENCOUNTER — Ambulatory Visit: Payer: Self-pay | Admitting: Pharmacist

## 2024-02-13 ENCOUNTER — Other Ambulatory Visit: Payer: Self-pay

## 2024-02-20 ENCOUNTER — Inpatient Hospital Stay: Payer: Medicare PPO | Admitting: Hematology and Oncology

## 2024-03-23 ENCOUNTER — Inpatient Hospital Stay: Admitting: Hematology and Oncology
